# Patient Record
Sex: Male | Born: 1990 | Race: White | Hispanic: No | Marital: Married | Smoking: Current some day smoker
Health system: Southern US, Community
[De-identification: ages and names within clinical notes are randomized; demographics above are authoritative.]

## PROBLEM LIST (undated history)

## (undated) DIAGNOSIS — I10 Essential (primary) hypertension: Secondary | ICD-10-CM

## (undated) DIAGNOSIS — J189 Pneumonia, unspecified organism: Secondary | ICD-10-CM

## (undated) DIAGNOSIS — K589 Irritable bowel syndrome without diarrhea: Secondary | ICD-10-CM

## (undated) DIAGNOSIS — K219 Gastro-esophageal reflux disease without esophagitis: Secondary | ICD-10-CM

## (undated) HISTORY — PX: ORIF WRIST FRACTURE: SHX2133

---
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
## 2010-03-20 ENCOUNTER — Encounter: Payer: Self-pay | Admitting: Orthopedic Surgery

## 2010-03-20 ENCOUNTER — Emergency Department (HOSPITAL_COMMUNITY): Admission: EM | Admit: 2010-03-20 | Discharge: 2010-03-20 | Payer: Self-pay | Admitting: Emergency Medicine

## 2010-03-23 ENCOUNTER — Encounter: Payer: Self-pay | Admitting: Orthopedic Surgery

## 2010-03-26 ENCOUNTER — Ambulatory Visit: Payer: Self-pay | Admitting: Orthopedic Surgery

## 2010-03-26 ENCOUNTER — Telehealth: Payer: Self-pay | Admitting: Orthopedic Surgery

## 2010-03-26 DIAGNOSIS — S93409A Sprain of unspecified ligament of unspecified ankle, initial encounter: Secondary | ICD-10-CM | POA: Insufficient documentation

## 2010-03-27 ENCOUNTER — Telehealth: Payer: Self-pay | Admitting: Orthopedic Surgery

## 2010-03-27 ENCOUNTER — Encounter: Payer: Self-pay | Admitting: Orthopedic Surgery

## 2010-03-29 ENCOUNTER — Encounter: Payer: Self-pay | Admitting: Orthopedic Surgery

## 2010-04-02 ENCOUNTER — Telehealth: Payer: Self-pay | Admitting: Orthopedic Surgery

## 2010-04-04 ENCOUNTER — Encounter: Payer: Self-pay | Admitting: Orthopedic Surgery

## 2010-04-05 ENCOUNTER — Encounter: Payer: Self-pay | Admitting: Orthopedic Surgery

## 2010-04-09 ENCOUNTER — Ambulatory Visit: Payer: Self-pay | Admitting: Orthopedic Surgery

## 2010-04-10 ENCOUNTER — Telehealth: Payer: Self-pay | Admitting: Orthopedic Surgery

## 2010-04-12 ENCOUNTER — Encounter: Payer: Self-pay | Admitting: Orthopedic Surgery

## 2010-04-19 ENCOUNTER — Encounter: Payer: Self-pay | Admitting: Orthopedic Surgery

## 2010-04-23 ENCOUNTER — Encounter: Payer: Self-pay | Admitting: Orthopedic Surgery

## 2010-04-26 ENCOUNTER — Encounter: Payer: Self-pay | Admitting: Orthopedic Surgery

## 2010-04-30 ENCOUNTER — Encounter (INDEPENDENT_AMBULATORY_CARE_PROVIDER_SITE_OTHER): Payer: Self-pay | Admitting: *Deleted

## 2010-04-30 ENCOUNTER — Ambulatory Visit: Payer: Self-pay | Admitting: Orthopedic Surgery

## 2010-05-07 ENCOUNTER — Encounter: Payer: Self-pay | Admitting: Orthopedic Surgery

## 2010-05-10 ENCOUNTER — Encounter: Payer: Self-pay | Admitting: Orthopedic Surgery

## 2010-05-14 ENCOUNTER — Ambulatory Visit: Payer: Self-pay | Admitting: Orthopedic Surgery

## 2010-05-15 ENCOUNTER — Encounter: Payer: Self-pay | Admitting: Orthopedic Surgery

## 2010-09-06 ENCOUNTER — Ambulatory Visit (HOSPITAL_COMMUNITY): Admission: RE | Admit: 2010-09-06 | Discharge: 2010-09-06 | Payer: Self-pay | Admitting: Pulmonary Disease

## 2010-09-20 ENCOUNTER — Ambulatory Visit: Payer: Self-pay | Admitting: Gastroenterology

## 2010-09-20 DIAGNOSIS — R197 Diarrhea, unspecified: Secondary | ICD-10-CM

## 2010-09-21 ENCOUNTER — Ambulatory Visit: Payer: Self-pay | Admitting: Gastroenterology

## 2010-09-21 ENCOUNTER — Ambulatory Visit (HOSPITAL_COMMUNITY): Admission: RE | Admit: 2010-09-21 | Discharge: 2010-09-21 | Payer: Self-pay | Admitting: Gastroenterology

## 2010-09-26 ENCOUNTER — Encounter: Payer: Self-pay | Admitting: Gastroenterology

## 2010-09-27 ENCOUNTER — Telehealth (INDEPENDENT_AMBULATORY_CARE_PROVIDER_SITE_OTHER): Payer: Self-pay

## 2010-09-28 ENCOUNTER — Encounter: Payer: Self-pay | Admitting: Gastroenterology

## 2010-09-28 ENCOUNTER — Encounter (INDEPENDENT_AMBULATORY_CARE_PROVIDER_SITE_OTHER): Payer: Self-pay

## 2010-10-01 ENCOUNTER — Ambulatory Visit (HOSPITAL_COMMUNITY): Admission: RE | Admit: 2010-10-01 | Discharge: 2010-10-01 | Payer: Self-pay | Admitting: Gastroenterology

## 2010-10-01 ENCOUNTER — Telehealth (INDEPENDENT_AMBULATORY_CARE_PROVIDER_SITE_OTHER): Payer: Self-pay

## 2010-10-03 DIAGNOSIS — R109 Unspecified abdominal pain: Secondary | ICD-10-CM | POA: Insufficient documentation

## 2010-10-19 ENCOUNTER — Encounter: Payer: Self-pay | Admitting: Gastroenterology

## 2010-10-23 ENCOUNTER — Ambulatory Visit: Payer: Self-pay | Admitting: Gastroenterology

## 2010-10-23 DIAGNOSIS — L738 Other specified follicular disorders: Secondary | ICD-10-CM

## 2010-10-23 DIAGNOSIS — K589 Irritable bowel syndrome without diarrhea: Secondary | ICD-10-CM

## 2010-10-24 ENCOUNTER — Encounter (INDEPENDENT_AMBULATORY_CARE_PROVIDER_SITE_OTHER): Payer: Self-pay | Admitting: *Deleted

## 2010-10-24 ENCOUNTER — Telehealth (INDEPENDENT_AMBULATORY_CARE_PROVIDER_SITE_OTHER): Payer: Self-pay

## 2010-10-24 ENCOUNTER — Telehealth (INDEPENDENT_AMBULATORY_CARE_PROVIDER_SITE_OTHER): Payer: Self-pay | Admitting: *Deleted

## 2011-01-08 ENCOUNTER — Encounter (INDEPENDENT_AMBULATORY_CARE_PROVIDER_SITE_OTHER): Payer: Self-pay | Admitting: *Deleted

## 2011-01-20 ENCOUNTER — Encounter: Payer: Self-pay | Admitting: Gastroenterology

## 2011-01-29 NOTE — Progress Notes (Signed)
Summary: call from patient, ankel still swelling,throbbing  Phone Note Call from Patient   Caller: Patient Summary of Call: Patient called to relay that he has his [facility] [place] on Wed 4/6.  Next appt here is [facility] 04/09/10.   Asking about throbbing, which he is having, except when elevating, and still some swelling.   Home ph is [phone] Initial call taken by: Cammie Sickle,  April 02, 2010 1:36 PM  Follow-up for Phone Call        this is normal, just elevate and ice as much as possible Follow-up by: Ether Griffins,  April 02, 2010 1:44 PM  Additional Follow-up for Phone Call Additional follow up Details #1::        advised patient. Additional Follow-up by: Cammie Sickle,  April 02, 2010 2:41 PM

## 2011-01-29 NOTE — Letter (Signed)
Summary: TCS/EGD ORDER  TCS/EGD ORDER   Imported By: Ave Filter 09/20/2010 11:44:29  _____________________________________________________________________  External Attachment:    Type:   Image     Comment:   External Document

## 2011-01-29 NOTE — Miscellaneous (Signed)
Summary: PT  progress note  PT  progress note   Imported By: Jacklynn Ganong 05/11/2010 09:45:11  _____________________________________________________________________  External Attachment:    Type:   Image     Comment:   External Document

## 2011-01-29 NOTE — Progress Notes (Signed)
Summary: Office Visits 08/09/04 and 09/28/03 RTwrist  Office Visits 08/09/04 and 09/28/03 RTwrist   Imported By: Cammie Sickle 03/26/2010 12:15:09  _____________________________________________________________________  External Attachment:    Type:   Image     Comment:   External Document

## 2011-01-29 NOTE — Progress Notes (Signed)
Summary: call to W.Comp case mgr auth for PT requested  Phone Note Outgoing Call   Call placed to: Insurer Summary of Call: [employer], [place], rep and case manager, [place], Ph [phone], fax [phone].  Requested authorization for physical therapy.  (Also had left a message 03/26/10 in general voice mail at [facility])  Faxed office notes and PT order 03/27/10.  Advised patient of status. Initial call taken by: Cammie Sickle,  March 27, 2010 10:50 AM  Follow-up for Phone Call        Rec'd call back from Workers Comp Carrier. Spoke with Jacki Cones.  She has verbally approved PT for [facility] in [place].  Faxing written approval.  She has contacted patient.  I verified with patient. Follow-up by: Cammie Sickle,  March 29, 2010 4:42 PM

## 2011-01-29 NOTE — Assessment & Plan Note (Signed)
Summary: ABD PAIN, DIARRHEA   Visit Type:  Initial Consult Referring Provider:  Ouida Sills Primary Care Provider:  Ouida Sills, M.D.  Chief Complaint:  abd pain/diarrhea.  History of Present Illness: Bms: 3-4 times/day. No nocturnal stools. No blood but has been green. Severe cramps: since AUG. Always had problems in school and looses bowel using Imodium as needed. Hyoscyamine helps to slow it down. Stool studies neg. No fever or chills, rash on lower legs, sores in his mouth, or joints, or melena. heat bum, pain in lower back when stomach hurts. Can't tell any foods make it worse. Avoiding dairy and fatty foods: helped with pain but not the diarrhea. No travel, Abx, and has city water. Chronically used Pepto Bismol. Pt does not like his job.  Current Medications (verified): 1)  Levsin 0.125 Mg Tabs (Hyoscyamine Sulfate) .... Three Times A Day  Allergies (verified): No Known Drug Allergies  Past History:  Past Medical History: Diarrhea  Family History: No FH of Colon, uterine, Ovarian, or Breast Cancer  Family History of Diabetes Family History Coronary Heart Disease male < 75 Hx, family, chronic respiratory condition Hx, family, kidney disease NEC  Mom has IBS-d and achalasia.  Cousin: colonic inertia, s/p subtotal colectomy.  Social History: Patient is single. Was working full time. life guard/sport coverage no smoking no alcohol some caffeine use Very physical and demanding job: distribution, Designer, jewellery ** out of work for 3 weeks**  Review of Systems       SEP 2011: 259 LBS  HB 13.6 PLT 300 ESR 2 CR 0.96 AST 21 ALT 29 ALB 4.3 TSH 2.664 TBILI 0.4     UGI/SBFT: NL TRANSIT, NO LESIONS  Vital Signs:  Patient profile:   20 year old male Height:      71 inches Weight:      253 pounds BMI:     35.41 Temp:     97.9 degrees F oral Pulse rate:   60 / minute BP sitting:   104 / 62  (left arm) Cuff size:   regular  Vitals Entered By: Cloria Spring LPN (September 20, 2010  11:03 AM)  Physical Exam  General:  Well developed, well nourished, no acute distress. Head:  Normocephalic and atraumatic. Eyes:  PERRL, no icterus. Mouth:  No deformity or lesions, dentition normal. Neck:  Supple; no masses. Lungs:  Clear throughout to auscultation. Heart:  Regular rate and rhythm; no murmurs. Abdomen:  Soft, nontender and nondistended. Normal bowel sounds. Extremities:  No edema or deformities noted. Neurologic:  Alert and  oriented x4;  grossly normal neurologically. 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 Differential diagnosis includes low likelihood of GIARDIASIS, CELIAC SPRUE, lactose intolerance, IBD, colorectal cancer, microscopic colitis, or eosinophilic gastroenetritis. TCS/EGD 9/23 BIOPSY COLON AND DUODENUM. Obtain stool study results from Dr. Ouida Sills.   CC: PCP  Appended Document: ABD PAIN, DIARRHEA pt has fu OV in one month scheduled  Appended Document: Orders Update    Clinical Lists Changes  Problems: Added new problem of ABDOMINAL PAIN (ICD-789.00) Orders: Added new Service order of Consultation Level IV [phone]) - Signed

## 2011-01-29 NOTE — Miscellaneous (Signed)
Summary: Orders Update  Clinical Lists Changes  Orders: Added new Test order of T-Stool Giardia / Crypto- EIA (87328) - Signed 

## 2011-01-29 NOTE — Progress Notes (Signed)
Summary: pt's mom expresses concern  Phone Note Call from Patient   Caller: Mom Summary of Call: Pt's mom came by to pick up the work note. She asked me to ask Dr.Fields if she thinks that his problems are coming from stress from his job. She was very tearful and said pt is afraid that he will lose his job. I told her that  I didn't know anything and i couldn't say if i did. She said she has been with him to some appts and he still lives at home and she was hoping Dr. Darrick Penna could give her some insight in to this. She is aware Dr. Darrick Penna not here,  and that she will not get a reply before tomorrow. Initial call taken by: Cloria Spring LPN,  October 24, 2010 4:36 PM     Appended Document: pt's mom expresses concern Please call his mother. I would be more than happy to speak with her. However, the pt needs to call me and give me permission to speak with her.   Appended Document: pt's mom expresses concern pts father aware

## 2011-01-29 NOTE — Miscellaneous (Signed)
Summary: PT Order + W.Comp approval billing   PT Order + W.Comp approval billing   Imported By: Cammie Sickle 04/21/2010 11:52:14  _____________________________________________________________________  External Attachment:    Type:   Image     Comment:   External Document

## 2011-01-29 NOTE — Miscellaneous (Signed)
Summary: PT Progress note  PT Progress note   Imported By: Jacklynn Ganong 05/09/2010 07:49:09  _____________________________________________________________________  External Attachment:    Type:   Image     Comment:   External Document

## 2011-01-29 NOTE — Miscellaneous (Signed)
Summary: PT progress note  PT progress note   Imported By: Jacklynn Ganong 06/18/2010 13:04:49  _____________________________________________________________________  External Attachment:    Type:   Image     Comment:   External Document

## 2011-01-29 NOTE — Op Note (Signed)
Summary: Op Report RT Wrist Closed reduction and percutaneous pinning  Op Report RT Wrist Closed reduction and percutaneous pinning   Imported By: Cammie Sickle 03/26/2010 10:50:27  _____________________________________________________________________  External Attachment:    Type:   Image     Comment:   External Document

## 2011-01-29 NOTE — Miscellaneous (Signed)
Summary: PT progress note  PT progress note   Imported By: Jacklynn Ganong 05/23/2010 16:41:06  _____________________________________________________________________  External Attachment:    Type:   Image     Comment:   External Document

## 2011-01-29 NOTE — Miscellaneous (Signed)
Summary: PT Progress notes  PT Progress notes   Imported By: Jacklynn Ganong 05/01/2010 12:20:57  _____________________________________________________________________  External Attachment:    Type:   Image     Comment:   External Document

## 2011-01-29 NOTE — Progress Notes (Signed)
Summary: abd pain and work note  Phone Note Call from Patient Call back at [employer] [phone]   Caller: Patient Summary of Call: pt called and stated he spoke with SLF yesterday. He said that she said she would give him something else if he continued to have abd pain. He is still taking the Levsin but is requesting something else called in for abd pain and diarrhea. He also said he didnt go to work today and is requesting a work note. I asked pt if he went for SBFT this am and he stated he didnt know anything about it, informed pt that we would need to reschedule. I also reminded him that his stool study container was at the front desk and he would need someone to pick it up and return it to the lab. please advise if pt can have work note and Rx. Initial call taken by: Hendricks Limes LPN,  September 27, 2010 10:01 AM     Appended Document: abd pain and work note Pt rescheduled to 10/01/10@8 :00am  Pt aware of appt.  Appended Document: abd pain and work note may have excuse for today; hold levsin; can try librax one cap ac and at bedtime; disp #40 ; no refills  Appended Document: abd pain and work note pt aware, work note at Psychologist, sport and exercise, rx called to [facility]

## 2011-01-29 NOTE — Letter (Signed)
Summary: Out of Work Note  [facility]  [address]   [place], [place] [address]   Phone: [phone]  Fax: [phone]    10/24/2010  TO: WHOM IT MAY CONCERN  RE: Mitchell Quinn [address] [id number] 06/10/1991       The above named individual is currently under my care and will be out of work    FROM: 10/24/2010   THROUGH: 10/25/2010     MAY RETURN ON: 10/26/2010     If you have any further questions or need additional information, please call.     Sincerely,     [facility] R. Roetta Sessions, M.D.    Jonette Eva, M.D. Lorenza Burton, FNP-BC    Tana Coast, PA-C Phone: [phone]    Fax: [phone]

## 2011-01-29 NOTE — Progress Notes (Signed)
Summary: patient question about swimming  called patient and advised.  ---- Converted from flag ---- ---- 03/26/2010 3:31 PM, Fuller Canada MD wrote: no swimming for 2 weeks  ---- 03/26/2010 3:25 PM, Cammie Sickle wrote: Tarri Abernethy, dob 1991/07/25, asking if swimming allowed for part of therapy? ------------------------------

## 2011-01-29 NOTE — Letter (Signed)
Summary: Out of Work*See corrected Work Excuse  [facility]  [address]  [place], [place] [address]   Phone: [phone]  Fax: [phone]    March 26, 2010   Employee:  Mitchell Quinn    To Whom It May Concern:   For Medical reasons, please excuse the above named employee from work for the following dates:  Start:   March 26, 2010  End:   April 09, 2010              Next appt scheduled April 09, 2010 at 2:45pm   If you need additional information, please feel free to contact our office.         Sincerely,    Terrance Mass, MD

## 2011-01-29 NOTE — Progress Notes (Signed)
Summary: Office notes faxed to Work Comp  Phone Note Other Incoming   Caller: Work Chief Technology Officer of Call: Jacki Cones from [employer], [employer], ph [phone] called to request office notes. Faxed to [phone].  Auth on file. Initial call taken by: Cammie Sickle,  April 10, 2010 2:57 PM

## 2011-01-29 NOTE — Progress Notes (Signed)
Summary: Office Visits 03/17/06,03/03/06,02/26/06 RTshoulder  Office Visits 03/17/06,03/03/06,02/26/06 RTshoulder   Imported By: Cammie Sickle 03/26/2010 12:17:04  _____________________________________________________________________  External Attachment:    Type:   Image     Comment:   External Document

## 2011-01-29 NOTE — Letter (Signed)
Summary: Employers Report of Injury W.Comp Form 19  Employers Report of Injury W.Comp Form 19   Imported By: Cammie Sickle 03/26/2010 15:36:59  _____________________________________________________________________  External Attachment:    Type:   Image     Comment:   External Document

## 2011-01-29 NOTE — Progress Notes (Signed)
Summary: another work note  Phone Note Call from Patient Call back at [employer] [phone]   Caller: Patient Summary of Call: pt came to office- he didnt go work on Sat. or Wynelle Link because he had abd pain and nausea. pt stated he had sbft today. He also stated that the librax seemed to help a little more that the levsin. pt wants to know if he can have a work note for Sat. and Sun. please advise Initial call taken by: Hendricks Limes LPN,  October 01, 2010 12:39 PM     Appended Document: another work note Please call pt. I will give him a note for SAT and SUN but I will not continue to give him work excuses. He has IBS and he needs to start a Imipramine qhs for pain and bowel control and see a mental health specialist to deal with stress related work issues. He needs to take Align daily. Med side effects include drowsiness, dry mouth/eyes, and constipation.  Call in Imipramine 10 mg 1 by mouth at bedtime for 3 days, 2 by mouth at bedtime for 3 days, and 3 by mouth at bedtime, #90, rfx5.   Appended Document: another work note Please let him know his SBFT was normal.  Appended Document: another work note Informed pt of the above. Rx called to [place] [facility]. Work note at front for pick-up.

## 2011-01-29 NOTE — Letter (Signed)
Summary: Out of Work  [facility]  [address]  [place], [place] [address]   Phone: [phone]  Fax: [phone]    April 09, 2010   Employee:  PAU BANH    To Whom It May Concern:   For Medical reasons, please excuse the above named employee from work for the following dates:  (Patient/Employee seen in our office for appointment today)  Start:   April 09, 2010 * Patient/Employee is to remain out of work, no light duty, no work  End:   Apr 30, 2010   Next apppointment:   Apr 30, 2010  Estimated return to work date:   May 01, 2010   If you need additional information, please feel free to contact our office.         Sincerely,   Terrance Mass, MD

## 2011-01-29 NOTE — Letter (Signed)
Summary: Out of Work Note  [facility]  [address]   [place], [place] [address]   Phone: [phone]  Fax: [phone]    09/28/2010  TO: WHOM IT MAY CONCERN  RE: Mitchell Quinn [address] [id number] 22-Feb-1991       The above named individual is currently under my care and will be out of work    09/27/2010     If you have any further questions or need additional information, please call.     Sincerely,     [facility] R. Roetta Sessions, M.D.    Jonette Eva, M.D. Lorenza Burton, FNP-BC    Tana Coast, PA-C Phone: [phone]    Fax: [phone]

## 2011-01-29 NOTE — Miscellaneous (Signed)
Summary: [facility]  [facility]   Imported By: Cammie Sickle 04/16/2010 07:06:40  _____________________________________________________________________  External Attachment:    Type:   Image     Comment:   External Document

## 2011-01-29 NOTE — Progress Notes (Signed)
Summary: Office Visit 10/03/04 back strain  Office Visit 10/03/04 back strain   Imported By: Cammie Sickle 03/26/2010 12:18:54  _____________________________________________________________________  External Attachment:    Type:   Image     Comment:   External Document

## 2011-01-29 NOTE — Miscellaneous (Signed)
Summary: PT Initial note  PT Initial note   Imported By: Jacklynn Ganong 04/18/2010 16:34:10  _____________________________________________________________________  External Attachment:    Type:   Image     Comment:   External Document

## 2011-01-29 NOTE — Letter (Signed)
Summary: Work Excuse update  [facility]  [address]  [place], [place] [address]   Phone: [phone]  Fax: [phone]     Today's Date: March 26, 2010  Name of Patient: Mitchell Quinn  The above named patient had a medical visit today at:   2:30 pm.  Please take this into consideration when reviewing the time away from work/school.    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   ________________________________________________________________________   Sincerely yours,   Terrance Mass, MD

## 2011-01-29 NOTE — Assessment & Plan Note (Signed)
Summary: 2 WK RE-CK RT ANKLE/W.COMP/DOI 03/20/10/CAF   Visit Type:  Follow-up Referring Provider:  self Primary Provider:  Dr. Ouida Sills  CC:  recheck ankle.  History of Present Illness: I saw Bohden Urias in the office today for a followup evaluation right ankle sprain, Worker Comp.  date of injury March 22  treated with ASO brace and Ibuprofen and PT   medication ibuprofen intermittently  physical therapy  at [facility]  Today is 2 week recheck after returning to work light duty, using Ibuprofen and bracing.  he's doing well and is improved.  He is anxious to return to normal duty  Ankle is stable range of motion is essentially normal.  Recommend continue his progression toward return to full duty with 8 8 hour days this week and then weightbearing in his ASO brace next week.  Allergies: No Known Drug Allergies   Other Orders: Est. Patient Level II ([address])  Patient Instructions: 1)  5/16 - 21[ 8 hrs ] 2)  5/22 on start ambulation at work  3)  brace,  continue thru 5 / 31  4)  f/u as needed  5)  ok to swim

## 2011-01-29 NOTE — Letter (Signed)
Summary: Out of Work  [facility]  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         Sincerely,    Fuller Canada MD

## 2011-01-29 NOTE — Progress Notes (Signed)
Summary: Pt has N/V  Phone Note Call from Patient Call back at [phone]   Caller: Patient Call For: Dr notified Reason for Call: Talk to Doctor Complaint: Nausea/Vomiting/Diarrhea, Abdominal Pain Action Taken: Ogden Handlin Notified Summary of Call: Patient called and stated he was N/V all night. He has not started the medication you prescribed for him on yesterday.Marland KitchenMarland KitchenMarland KitchenPlease advise?  Initial call taken by: Ave Filter,  October 24, 2010 10:01 AM     Appended Document: Pt has N/V Please call pt. He probably has a viral illness. Follow a clear liquid diet for 24 hours then advance to Full liquid diet. Call in Phenergan 25 mg 1/2-1 tab by mouth q4-6h as needed nausea or vomiting, #25 rfx0. Work excuse for 10/26-27. RTW 10/28.  Appended Document: Pt has N/V Pt aware..Rx needs to be called to [facility] in [place]  Appended Document: Pt has N/V Rx called to [place] [facility].

## 2011-01-29 NOTE — Miscellaneous (Signed)
Summary: PT initial note  PT initial note   Imported By: Jacklynn Ganong 04/19/2010 10:13:49  _____________________________________________________________________  External Attachment:    Type:   Image     Comment:   External Document

## 2011-01-29 NOTE — Miscellaneous (Signed)
Summary: Rehab Report  Rehab Report   Imported By: Cammie Sickle 04/21/2010 11:50:52  _____________________________________________________________________  External Attachment:    Type:   Image     Comment:   External Document

## 2011-01-29 NOTE — Letter (Signed)
Summary: Historic Patient File  Historic Patient File   Imported By: Mitchell Quinn 03/30/2010 09:48:44  _____________________________________________________________________  External Attachment:    Type:   Image     Comment:   history form

## 2011-01-29 NOTE — Letter (Signed)
Summary: SBFT ORDER  SBFT ORDER   Imported By: Ave Filter 09/26/2010 12:28:24  _____________________________________________________________________  External Attachment:    Type:   Image     Comment:   External Document

## 2011-01-29 NOTE — Letter (Signed)
Summary: WORK NOTE  WORK NOTE   Imported By: Rexene Alberts 10/19/2010 09:04:34  _____________________________________________________________________  External Attachment:    Type:   Image     Comment:   External Document

## 2011-01-29 NOTE — Assessment & Plan Note (Signed)
Summary: 2 WK RE-CK RT ANKLE/W.COMP,DOI 03/20/10/CAF   Visit Type:  Follow-up Referring Provider:  self Primary Provider:  Dr. Ouida Sills  CC:  right ankle pain.  History of Present Illness: visit for Primo Innis status post grade 2 ankle sprain, he says the therapist to take the Cam Walker off which is not what I ordered  He is now in an ASO brace he is ambulating with crutches  His swelling has decreased significantly he still has a lot of stiffness in the ankle as a tenderness along the tib-fib area and along the ATFL  Recommend continued physical therapy weight-bear as tolerated convert ASO brace now.  out of work for 3 more weeks come back in 3 weeks  Allergies: No Known Drug Allergies   Impression & Recommendations:  Problem # 1:  UNSPECIFIED SITE OF ANKLE SPRAIN AND STRAIN (ICD-845.00) Assessment Improved  Orders: Est. Patient Level II ([address])  Patient Instructions: 1)  Please schedule a follow-up appointment in 3 weeks. 2)  ASO brace WBAT

## 2011-01-29 NOTE — Letter (Signed)
Summary: Out of Work  [facility]  [address]  [place], [place] [address]   Phone: [phone]  Fax: [phone]    Apr 30, 2010 - appointment in our office today 3:15PM   Employee:  Mitchell Quinn    To Whom It May Concern:   For Medical reasons, please note that the above named employee may resume work as follows:  05/01/10 for Light Duty  - sitting, elevating as needed, working 4 hours a day for 1 week  - continue physical therapy as scheduled  - for week 2, increase to 6 hours a day   Follow up appointment scheduled in 2 weeks - appointment 05/14/10, 11:45am         If you need additional information, please feel free to contact our office.         Sincerely,    Terrance Mass, MD

## 2011-01-29 NOTE — Miscellaneous (Signed)
Summary: Rehab Report 5/9 - 05/10/10  Rehab Report 5/9 - 05/10/10   Imported By: Cammie Sickle 06/06/2010 17:58:42  _____________________________________________________________________  External Attachment:    Type:   Image     Comment:   External Document

## 2011-01-29 NOTE — Letter (Signed)
Summary: [employer] ins request  [employer] ins request   Imported By: Cammie Sickle 04/23/2010 17:16:06  _____________________________________________________________________  External Attachment:    Type:   Image     Comment:   External Document

## 2011-01-29 NOTE — Assessment & Plan Note (Signed)
Summary: IBS-d, FOLIICULITIS   Visit Type:  Follow-up Visit Primary Care Mitchell Quinn:  Mitchell Quinn, M.D.  Chief Complaint:  f/u diarrhea/abd pain.  History of Present Illness: Had a red rash since on arms, legs, sides. May drain or have little whiteheads. Sx for one week. Doesn't itch. Taking ALign daily. Food doesn't afect BMs. Spicy foods makes stomach hurt worse. Bms: mostly 1 in AM, about normal. Stomach hurts only once a week. Appetite: good. Works 5 days a week with random days off. No new soaps, detergents, cologne, or clothes. Sweats alot at work and then drives home for 30 mins/. Shifts  ~10-12 hours.   Current Medications (verified): 1)  Levsin 0.125 Mg Tabs (Hyoscyamine Sulfate) .... Three Times A Day 2)  Librax 2.5-5 Mg Caps (Clidinium-Chlordiazepoxide) .... As Directed  Allergies (verified): No Known Drug Allergies  Past History:  Past Medical History: Diarrhea-IBS  Social History: Reviewed history from 09/20/2010 and no changes required. Patient is single. 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 Head:  Normocephalic and atraumatic. Lungs:  Clear throughout to auscultation. Heart:  Regular rate and rhythm; no murmurs Abdomen:  Soft, nontender and nondistended. Normal bowel sounds. Skin:  maculopapular rash: ON LEGS, ARMA, AND TORSO. No pustules.  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 Continue ALign daily and  Imipraimine at bedtome. Use Levsin OR Librax as needed if Sx flare. Follow up in 3 mos.  Problem # 2:  FOLLICULITIS (ICD-704.8) Take Augmentin 500 mg two times a day for 5 days. It may cause red rash, loose stools, or abd pain. See Dr. Ouida Quinn if Sx not resolved in 7 days.  CC: PCP  Patient Instructions: 1)  Take Augmentin 500 mg two times a day for 5 days. 2)  It may cause red rash, loose stools, or abd pain. 3)  Continue ALign daily and Imipraimine at bedtome. 4)  Use Levsin OR Librax as needed if Sx flare. 5)  Follow up in 3 mos. 6)  The medication list was reviewed and reconciled.  All changed / newly prescribed medications were explained.  A complete medication list was provided to the patient / caregiver.  Prescriptions: AUGMENTIN 500-125 MG TABS (AMOXICILLIN-POT CLAVULANATE) 1 by mouth q12h for 5 days  #10 x 0   Entered and Authorized by:   West Bali MD   Signed by:   West Bali MD on 10/23/2010   Method used:   Electronically to        [facility] (retail)       [address]       [place], [place]  [address]       Ph: [phone]       Fax: [phone]   RxID:   [id number]   Appended Document: Orders Update  Clinical Lists Changes  Orders: Added new Service order of Est. Patient Level IV ([address]) - Signed      Appended Document: IBS-d, FOLIICULITIS 3 MONTH F/U REMINDER IS IN THE COMPUTER  Appended Document: IBS-d, FOLIICULITIS SEP 2011 NEG STOOL CX

## 2011-01-29 NOTE — Assessment & Plan Note (Signed)
Summary: NEW PROB/ANKLE INJURY/W.COMP/HAD XRAY 03/22/10/CAF   Vital Signs:  Patient profile:   20 year old male Height:      71 inches Weight:      247 pounds Pulse rate:   76 / minute Resp:     16 per minute  Vitals Entered By: Fuller Canada MD (March 26, 2010 2:55 PM)  Visit Type:  new problem Referring Provider:  self Primary Provider:  Dr. Ouida Sills  CC:  right ankle pain.  History of Present Illness:   I saw Mitchell Quinn in the office today for an initial evaluation.  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  He basically has moderate RIGHT ankle pain associated with throbbing and burning.  There is some bruising numbness and tingling in the foot he has some calf pain a lot of swelling.  Pain described as burning and throbbing   Allergies (verified): No Known Drug Allergies  Past History:  Family History: Last updated: 03/26/2010 FH of Cancer:  Family History of Diabetes Family History Coronary Heart Disease male < 69 Hx, family, chronic respiratory condition Hx, family, kidney disease NEC  Social History: Last updated: 03/26/2010 Patient is single.  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 Neurologic:  Complains of numbness and tingling; denies unsteady gait, dizziness, tremors, and seizure. Musculoskeletal:  Complains of swelling, stiffness, and muscle pain; denies joint pain, instability, redness, and heat.  The review of systems is negative for Constitutional, Cardiovascular, Respiratory, Genitourinary, Endocrine, Psychiatric, Skin, HEENT, Immunology, and Hemoatologic.  Physical Exam  Additional Exam:  vitals are normal  He is a large male normal development grooming and hygiene  Normal pulse temperature in his RIGHT foot with no varicose veins  Gait and station he is walking with nonweightbearing status with crutches  He has a large amount of swelling in his foot and up in his leg as well.  His tracks around the posterior part of the foot and shows a lot of ecchymosis.  He has tenderness over the anterior talofibular ligament and the calcaneofibular ligament there is some tenderness in the syndesmosis region.  He has passive range of motion to neutral position and dorsiflexion and plantar flexion 20.  There is a positive drawer test.  Muscle tone is normal muscle strength is poor with eversion.  Skin is ecchymotic but intact  He's awake alert and oriented x3 he's a normal mood and sensation remains intact.   Impression & Recommendations:  Problem # 1:  UNSPECIFIED SITE OF ANKLE SPRAIN AND STRAIN (ICD-845.00)  Assessment New he did have x-rays and they are -3 views RIGHT ankle reviewed with report, agree no fracture  Assessment: type II possibly 3 ankle sprain  Recommend physical therapy as soon as possible  Where Cam Walker weight-bear as tolerated with crutches  Continue with a contrast bath 3 times a day  Follow up 2  weeks  Should not work.  Orders: Est. Patient Level IV ([address])  Other Orders: Physical Therapy Referral (PT)  Patient Instructions: 1)  Physical Therapy  2)  Wear Cam Walker  3)  Return in 2 weeks

## 2011-01-29 NOTE — Assessment & Plan Note (Signed)
Summary: 3 wk RE-CK RT ANKLE/W.COMP/DOI 03/20/10/CAF   Visit Type:  Follow-up Referring Provider:  self Primary Provider:  Dr. Ouida Sills  CC:  3 week recheck right  ankle.Marland Kitchen  History of Present Illness: I saw Mitchell Quinn in the office today for a followup evaluation right ankle sprain, Worker Comp.  date of injury March 22 treated with ASO brace and Ibuprofen.  medication ibuprofen intermittently  physical therapy is done at hand and rehabilitation  Complains of popping, swelling and tenderness and stiffness when walking  ankle drawer test is normal most of the swelling has gone down in the foot he still has some lateral ankle swelling near the fibula.  Anterolateral gutter still tender.  His range of motion has returned to near normal.  He does have pain with anterior drawer test and pain with inversion.  He is now 41 days or 6 weeks since his grade 2/3 ankle sprain and he is starting to turn the corner.  Recommend resume work on a graduated schedule.  Continue physical therapy.  Continue ASO brace.    Allergies: No Known Drug Allergies  Review of Systems Neurologic:  Denies numbness and tingling. Musculoskeletal:  See HPI.   Impression & Recommendations:  Problem # 1:  UNSPECIFIED SITE OF ANKLE SPRAIN AND STRAIN (ICD-845.00) Assessment Improved  Orders: Est. Patient Level III ([address])  Patient Instructions: 1)  Stay in ASO except for bathing and sleeping. 2)  Continue therapy 3)  You can return to work light duty, sitting elevating as needed, 4 hrs a day for a week, week 2 increase time of work to 6 hrs 4)  Come back in 2 weeks

## 2011-01-29 NOTE — Miscellaneous (Signed)
Summary: PT Progress note  PT Progress note   Imported By: Jacklynn Ganong 05/11/2010 09:44:09  _____________________________________________________________________  External Attachment:    Type:   Image     Comment:   External Document

## 2011-01-29 NOTE — Miscellaneous (Signed)
  Clinical Lists Changes  Medications: Added new medication of NORCO 5-325 MG TABS (HYDROCODONE-ACETAMINOPHEN) 1 ev 4 hrs as needed pain - Signed Rx of NORCO 5-325 MG TABS (HYDROCODONE-ACETAMINOPHEN) 1 ev 4 hrs as needed pain;  #30 x 0;  Signed;  Entered by: Fuller Canada MD;  Authorized by: Fuller Canada MD;  Method used: Printed then faxed to [facility] [address], [place], [place], [place]  [address], Ph: [phone], Fax: [phone] Observations: Added new observation of NKA: T (03/23/2010 12:47)    Prescriptions: NORCO 5-325 MG TABS (HYDROCODONE-ACETAMINOPHEN) 1 ev 4 hrs as needed pain  #30 x 0   Entered and Authorized by:   Fuller Canada MD   Signed by:   Fuller Canada MD on 03/23/2010   Method used:   Printed then faxed to ...       [facility] (retail)       [address]       [place], [place]  [address]       Ph: [phone]       Fax: [phone]   RxID:   [id number]

## 2011-01-29 NOTE — Letter (Signed)
Summary: [facility] [employer] fax  [employer] fax   Imported By: Cammie Sickle 04/23/2010 17:00:59  _____________________________________________________________________  External Attachment:    Type:   Image     Comment:   External Document

## 2011-01-31 NOTE — Letter (Signed)
Summary: Recall Office Visit  [facility]  [address]   [place], [place] [address]   Phone: [phone]  Fax: [phone]      January 08, 2011   HYLAND MOLLENKOPF [address] [place], [place]  [address] 1991-06-08   Dear Mr. SCHUPP,   According to our records, it is time for you to schedule a follow-up office visit with [place].   At your convenience, please call [phone] to schedule an office visit. If you have any questions, concerns, or feel that this letter is in error, we would appreciate your call.   Sincerely,    Diana Eves  [facility] Ph: [phone]   Fax: [phone]

## 2011-02-16 ENCOUNTER — Emergency Department (HOSPITAL_BASED_OUTPATIENT_CLINIC_OR_DEPARTMENT_OTHER)
Admission: EM | Admit: 2011-02-16 | Discharge: 2011-02-16 | Disposition: A | Discharge status: Home / Self Care | Payer: Self-pay | Attending: Emergency Medicine | Admitting: Emergency Medicine

## 2011-02-16 ENCOUNTER — Emergency Department (INDEPENDENT_AMBULATORY_CARE_PROVIDER_SITE_OTHER): Payer: Self-pay

## 2011-02-16 DIAGNOSIS — W19XXXA Unspecified fall, initial encounter: Secondary | ICD-10-CM | POA: Insufficient documentation

## 2011-02-16 DIAGNOSIS — M25569 Pain in unspecified knee: Secondary | ICD-10-CM

## 2011-02-16 DIAGNOSIS — Y9289 Other specified places as the place of occurrence of the external cause: Secondary | ICD-10-CM | POA: Insufficient documentation

## 2011-02-16 DIAGNOSIS — Y9269 Other specified industrial and construction area as the place of occurrence of the external cause: Secondary | ICD-10-CM

## 2011-02-16 DIAGNOSIS — W230XXA Caught, crushed, jammed, or pinched between moving objects, initial encounter: Secondary | ICD-10-CM

## 2011-03-24 LAB — RAPID URINE DRUG SCREEN, HOSP PERFORMED
Cocaine: NOT DETECTED
Tetrahydrocannabinol: NOT DETECTED

## 2011-03-24 LAB — ETHANOL: Alcohol, Ethyl (B): <5 mg/dL (ref 0–10)

## 2011-05-01 IMAGING — CR DG ANKLE COMPLETE 3+V*R*
3 series · 3 of 3 positions shown · non-contrast
Comparison: None

CLINICAL DATA: Right ankle injury, pain, swelling, redness

RIGHT ANKLE - COMPLETE 3+ VIEW

[view not recorded (1 of 3)]
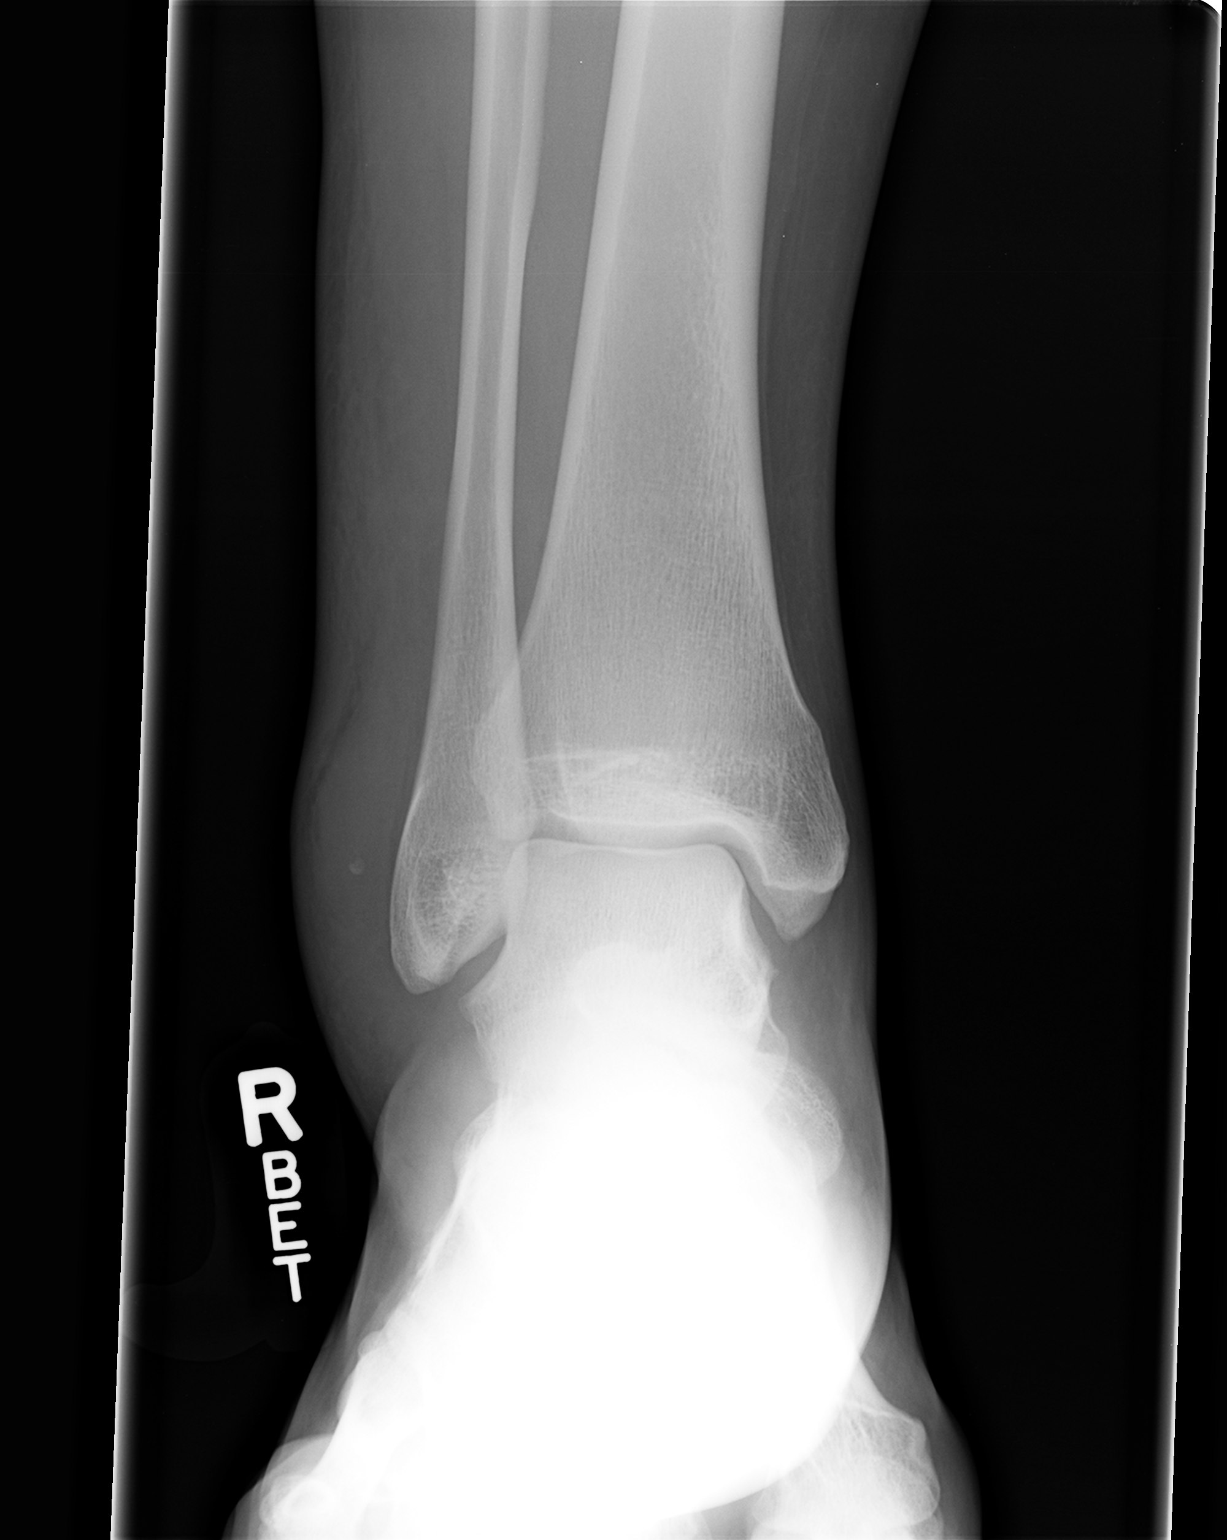

[view not recorded (2 of 3)]
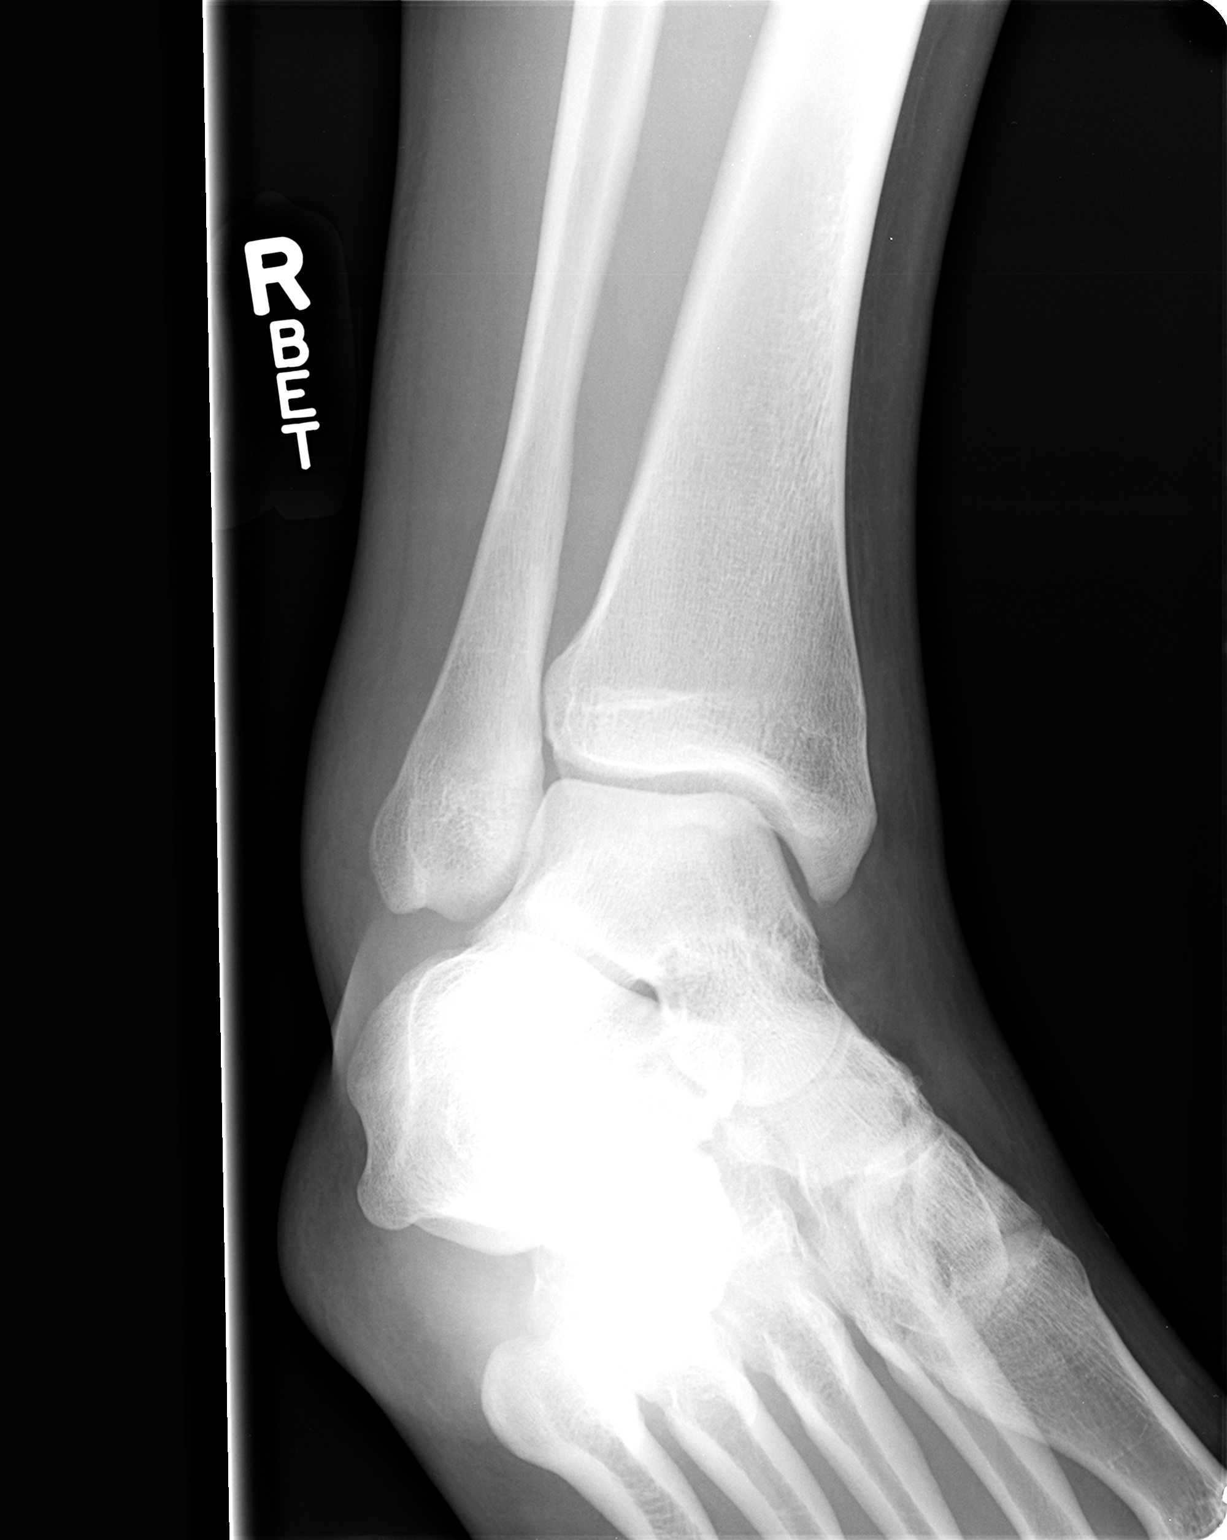

[view not recorded (3 of 3)]
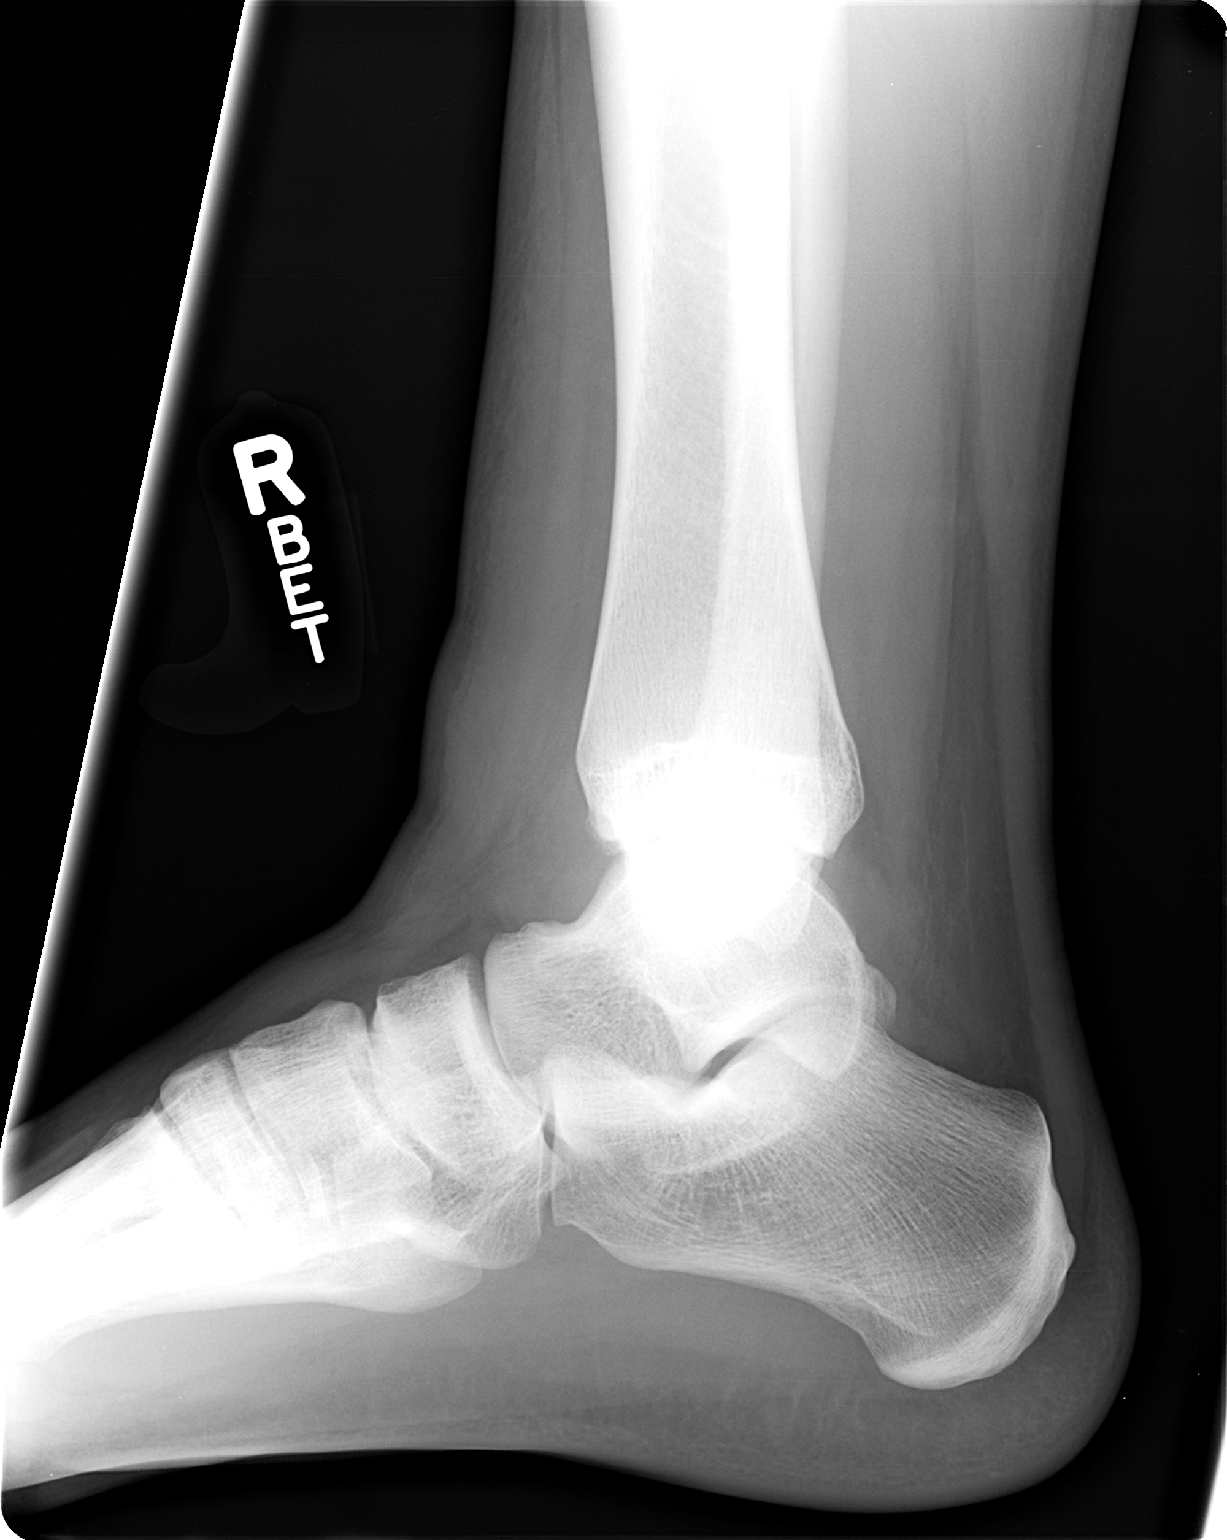

[3 of 3 positions shown; findings below may reference images not displayed]

FINDINGS: Bone mineralization normal.
Ankle mortise intact.
Significant soft tissue swelling anteriorly and laterally.
No acute fracture, dislocation or bone destruction.
Probable ankle joint effusion.
IMPRESSION: No acute bony abnormalities.

## 2011-05-17 NOTE — Op Note (Signed)
   [name], [name]                       ACCOUNT NO.:  [id number]   MEDICAL RECORD NO.:  [id number]                   PATIENT TYPE:  AMB   LOCATION:  DAY                                  FACILITY:  [facility]   PHYSICIAN:  Vickki Hearing, M.D.           DATE OF BIRTH:  02/23/1991   DATE OF PROCEDURE:  DATE OF DISCHARGE:                                 OPERATIVE REPORT   PREOPERATIVE DIAGNOSIS:  Closed distal radius fracture, right wrist.   POSTOPERATIVE DIAGNOSIS:  Closed distal radius fracture, right wrist.   PROCEDURE:  Closed reduction and percutaneous pinning.   INDICATIONS FOR PROCEDURE:  This 20 year old male fell at [facility] and  injured the right distal radius and sustained an angulated displaced closed  fracture.  After presentation to the ER and evaluation, his parents gave  informed consent for closed reduction and percutaneous pinning.   SURGEON:  Vickki Hearing, M.D.   ANESTHESIA:  General.   FINDINGS:  Angulated displaced closed distal radius fracture.   DESCRIPTION OF PROCEDURE:  The patient was identified as Mitchell Quinn.  The right wrist was marked for surgery with the surgeon's initials.  He was  given Ancef and taken to the operating room where he had general anesthesia.  At that time, a time out was taken to confirm the operative site and the  correct patient.  This was confirmed, and we proceeded with prep and drape  of the right wrist while in traction.  After 10 minutes of traction with 10  pounds, radiographs confirmed that the fracture was in good position.  Finger manipulation over the distal fragment pushed it into appropriate  position, and the 4-5 K-wire was passed across the fracture site, bent, cut,  and capped.  A sugartong splint was applied.   The patient was extubated and taken to the recovery room in stable  condition.   POSTOPERATIVE INSTRUCTIONS:  Scheduled follow up is for Friday.  Ice and  elevation.  Keep  dressing dry.  May return to school when feeling well.                                               Vickki Hearing, M.D.    SEH/MEDQ  D:  08/09/2003  T:  08/09/2003  Job:  [id number]

## 2011-05-17 NOTE — H&P (Signed)
   NAMEKAWAN, VALLADOLID NO.:  [id number]   MEDICAL RECORD NO.:  [id number]                  PATIENT TYPE:   LOCATION:                                       FACILITY:   PHYSICIAN:  Vickki Hearing, M.D.           DATE OF BIRTH:   DATE OF ADMISSION:  DATE OF DISCHARGE:                                HISTORY & PHYSICAL   CHIEF COMPLAINT:  Pain, right wrist.   HISTORY:  This is a 20 year old male who slipped Friday on grass at  [facility] and injured his right wrist.  X-rays show an angulated,  displaced right distal radius fracture with no comminution.  It appears to  involve the growth plate.  He complains of pain, swelling, decreased range  of motion.  He is in a splint.  He had been to the emergency room for  evaluation, where the splint was placed after the fracture was noted.   REVIEW OF SYSTEMS:  Negative.   ALLERGIES:  None.   MEDICAL PROBLEMS:  None.   SURGERY:  None.   MEDICATIONS:  Strattera, just started today.   FAMILY HISTORY:  Heart disease, grandparents.   SOCIAL HISTORY:  Negative.  Current grade 8.   PHYSICAL EXAMINATION:  VITAL SIGNS:  Weight 214, respiratory rate of 20,  pulse low 70s.  GENERAL:  A healthy, well-developed, well-nourished male, hygiene normal, no  deformity.  CARDIAC:  No swelling or varicosities.  Pulses intact.  No temperature  noted, no edema, no tenderness.  LYMPHATIC:  Negative.  MUSCULOSKELETAL:  Gait and station normal.  Wrist is in a splint.  Range of  motion, stability, and strength examination of the right upper extremity  deferred.  All other extremities normal.  SKIN:  Normal.  NEUROLOGIC:  Normal.  PSYCHIATRIC:  Normal.   MEDICAL DECISION MAKING:  Data reviewed.  X-rays from the hospital show an  angulated distal radius fracture with dorsal angulation, dorsal tilt of the  joint surface.  It is near and around the growth plate.  I cannot tell if it  involves it or not.   DIAGNOSIS:  Distal radius fracture.   PLAN:  Closed reduction, percutaneous pinning.  CPT code is [phone], ICD-9  code is 813.41.  Planned procedure code 11914.                                               Vickki Hearing, M.D.    SEH/MEDQ  D:  08/08/2003  T:  08/08/2003  Job:  [id number]

## 2013-06-24 ENCOUNTER — Emergency Department (HOSPITAL_COMMUNITY)
Admission: EM | Admit: 2013-06-24 | Discharge: 2013-06-24 | Disposition: A | Discharge status: Home / Self Care | Payer: Self-pay | Source: Home / Self Care | Attending: Emergency Medicine | Admitting: Emergency Medicine

## 2013-06-24 ENCOUNTER — Encounter (HOSPITAL_COMMUNITY): Payer: Self-pay | Admitting: Emergency Medicine

## 2013-06-24 DIAGNOSIS — L02419 Cutaneous abscess of limb, unspecified: Secondary | ICD-10-CM

## 2013-06-24 DIAGNOSIS — L03119 Cellulitis of unspecified part of limb: Secondary | ICD-10-CM

## 2013-06-24 DIAGNOSIS — Z23 Encounter for immunization: Secondary | ICD-10-CM

## 2013-06-24 MED ORDER — CEPHALEXIN 500 MG PO CAPS: 500.0000 mg | ORAL_CAPSULE | Freq: Four times a day (QID) | ORAL | Status: DC

## 2013-06-24 MED ORDER — TETANUS-DIPHTH-ACELL PERTUSSIS 5-2.5-18.5 LF-MCG/0.5 IM SUSP: 0.5000 mL | Freq: Once | INTRAMUSCULAR | Status: DC

## 2013-06-24 MED ORDER — TETANUS-DIPHTH-ACELL PERTUSSIS 5-2.5-18.5 LF-MCG/0.5 IM SUSP
INTRAMUSCULAR | Status: AC
  Filled 2013-06-24: qty 0.5

## 2013-06-24 NOTE — ED Notes (Signed)
Pt c/o scrapes to left calf three days ago. Pt states a pot/vase fell on leg. Area is red with mild swelling and tenderness. Denies any other symptoms.  Pt is unsure of last tetanus shot.

## 2013-06-26 NOTE — ED Provider Notes (Signed)
Medical screening examination/treatment/procedure(s) were performed by non-physician practitioner and as supervising physician I was immediately available for consultation/collaboration.  Leslee Home, M.D.  Reuben Likes, MD 06/26/13 [phone]

## 2013-06-26 NOTE — ED Provider Notes (Signed)
   History    CSN: [id number] Arrival date & time 06/24/13  4098  First MD Initiated Contact with Patient 06/24/13 1930     Chief Complaint  Patient presents with  . Extremity Laceration    scrapes to left calf 3 days ago. tightness, redness and swelling    (Consider location/radiation/quality/duration/timing/severity/associated sxs/prior Treatment) HPI  22 yo wm presents today with scrapes, redness, and pain in his left calf.  States that 3 days ago he scraped his leg on a dirty flower pot outside his home.  Cannot recall when his last tetanus shot was.  Denies fever, chills.  Leg has become more sore with more redness.   History reviewed. No pertinent past medical history. History reviewed. No pertinent past surgical history. History reviewed. No pertinent family history. History  Substance Use Topics  . Smoking status: Never Smoker   . Smokeless tobacco: Not on file  . Alcohol Use: No    Review of Systems  Constitutional: Negative.   HENT: Negative.   Eyes: Negative.   Respiratory: Negative.   Cardiovascular: Negative.   Gastrointestinal: Negative.   Genitourinary: Negative.   Musculoskeletal:       Left calf soreness.   Skin:       Redness left calf  Neurological: Negative.   Psychiatric/Behavioral: Negative.     Allergies  Review of patient's allergies indicates no known allergies.  Home Medications   Current Outpatient Rx  Name  Route  Sig  Dispense  Refill  . cephALEXin (KEFLEX) 500 MG capsule   Oral   Take 1 capsule (500 mg total) by mouth every 6 (six) hours.   30 capsule   0    BP 130/99  Pulse 67  Temp(Src) 98.6 F (37 C) (Oral)  Resp 18  SpO2 100% Physical Exam  Constitutional: He is oriented to person, place, and time. He appears well-developed and well-nourished.  HENT:  Head: Normocephalic and atraumatic.  Eyes: EOM are normal. Pupils are equal, round, and reactive to light.  Neck: Normal range of motion.  Cardiovascular: Normal rate and  regular rhythm.   Pulmonary/Chest: Effort normal and breath sounds normal.  Abdominal: Soft.  Musculoskeletal: Normal range of motion.  Neurological: He is alert and oriented to person, place, and time.  Skin:  Does have a few scrapes on left calf with some redness and soreness.  Possible superficial skin infection.  No drainage.    Psychiatric: He has a normal mood and affect.    ED Course  Procedures (including critical care time) Labs Reviewed - No data to display No results found. 1. Bacterial skin infection of leg     MDM  Patient will f/u in clinic in a few days if symptoms worsen or not improved.  Voices understanding  Meds ordered this encounter  Medications  . cephALEXin (KEFLEX) 500 MG capsule    Sig: Take 1 capsule (500 mg total) by mouth every 6 (six) hours.    Dispense:  30 capsule    Refill:  0  . DISCONTD: TDaP (BOOSTRIX) injection 0.5 mL    Sig:     Zonia Kief, PA-C 06/26/13 1129

## 2013-11-14 ENCOUNTER — Emergency Department (HOSPITAL_COMMUNITY)
Admission: EM | Admit: 2013-11-14 | Discharge: 2013-11-14 | Disposition: A | Discharge status: Home / Self Care | Payer: BC Managed Care – PPO | Source: Home / Self Care | Attending: Emergency Medicine | Admitting: Emergency Medicine

## 2013-11-14 ENCOUNTER — Encounter (HOSPITAL_COMMUNITY): Payer: Self-pay | Admitting: Emergency Medicine

## 2013-11-14 DIAGNOSIS — K5289 Other specified noninfective gastroenteritis and colitis: Secondary | ICD-10-CM

## 2013-11-14 DIAGNOSIS — K529 Noninfective gastroenteritis and colitis, unspecified: Secondary | ICD-10-CM

## 2013-11-14 HISTORY — DX: Irritable bowel syndrome, unspecified: K58.9

## 2013-11-14 LAB — CBC WITH DIFFERENTIAL/PLATELET
Basophils Absolute: 0 10*3/uL (ref 0.0–0.1)
HCT: 42.9 % (ref 39.0–52.0)
Hemoglobin: 15.8 g/dL (ref 13.0–17.0)
Lymphocytes Relative: 32 % (ref 12–46)
Lymphs Abs: 2.6 10*3/uL (ref 0.7–4.0)
MCV: 87.7 fL (ref 78.0–100.0)
Monocytes Absolute: 0.5 10*3/uL (ref 0.1–1.0)
Neutro Abs: 4.9 10*3/uL (ref 1.7–7.7)
RBC: 4.89 MIL/uL (ref 4.22–5.81)
RDW: 12.3 % (ref 11.5–15.5)
WBC: 8 10*3/uL (ref 4.0–10.5)

## 2013-11-14 LAB — POCT I-STAT, CHEM 8
BUN: 11 mg/dL (ref 6–23)
Calcium, Ion: 1.29 mmol/L — ABNORMAL HIGH (ref 1.12–1.23)
Chloride: 102 mEq/L (ref 96–112)
Creatinine, Ser: 1.1 mg/dL (ref 0.50–1.35)

## 2013-11-14 MED ORDER — ONDANSETRON 4 MG PO TBDP
ORAL_TABLET | ORAL | Status: AC
  Filled 2013-11-14: qty 2

## 2013-11-14 MED ORDER — ONDANSETRON 8 MG PO TBDP: 8.0000 mg | ORAL_TABLET | Freq: Three times a day (TID) | ORAL | Status: DC | PRN

## 2013-11-14 MED ORDER — DIPHENOXYLATE-ATROPINE 2.5-0.025 MG PO TABS: 1.0000 | ORAL_TABLET | Freq: Four times a day (QID) | ORAL | Status: DC | PRN

## 2013-11-14 MED ORDER — ONDANSETRON 4 MG PO TBDP
8.0000 mg | ORAL_TABLET | Freq: Once | ORAL | Status: AC
  Administered 2013-11-14: 8 mg via ORAL

## 2013-11-14 NOTE — ED Notes (Signed)
Ginger ale provided.

## 2013-11-14 NOTE — ED Notes (Signed)
C/O intermittent dizziness, nausea, vomiting, diarrhea x2 days.  States sxs are starting to improve; able to keep down fluids and some food.  C/O intermittent abd cramping, but denies at present.

## 2013-11-14 NOTE — ED Provider Notes (Signed)
Chief Complaint:   Chief Complaint  Patient presents with  . Vomiting  . Diarrhea    History of Present Illness:   Mitchell Quinn is a 22 year old male who presents with a three-day history of headache, sweats, nasal congestion, nausea, vomiting, diarrhea, dizziness, and crampy abdominal pain. He had 3 episodes of emesis yesterday and one today. No blood in the emesis, coffee-ground emesis, or bilious emesis. He had 6 diarrheal stools yesterday and 3 today. No blood in the stool or melena. He has some irritation around his rectum. He's had no sick exposures. No suspicious ingestions, animal exposure, or foreign travel.  Review of Systems:  Other than noted above, the patient denies any of the following symptoms: Systemic:  No fevers, chills, sweats, weight loss or gain, fatigue, or tiredness. ENT:  No nasal congestion, rhinorrhea, or sore throat. Lungs:  No cough, wheezing, or shortness of breath. Cardiac:  No chest pain, syncope, or presyncope. GI:  No abdominal pain, nausea, vomiting, anorexia, diarrhea, constipation, blood in stool or vomitus. GU:  No dysuria, frequency, or urgency.  PMFSH:  Past medical history, family history, social history, meds, and allergies were reviewed.   Physical Exam:   Vital signs:  BP 135/84  Pulse 60  Temp(Src) 97.9 F (36.6 C) (Oral)  Resp 18  SpO2 98% General:  Alert and oriented.  In no distress.  Skin warm and dry.  Good skin turgor, brisk capillary refill. ENT:  No scleral icterus, moist mucous membranes, no oral lesions, pharynx clear. Lungs:  Breath sounds clear and equal bilaterally.  No wheezes, rales, or rhonchi. Heart:  Rhythm regular, without extrasystoles.  No gallops or murmers. Abdomen:  Soft, flat, nondistended. No organomegaly or mass. Bowel sounds were normally active. He has mild tenderness to palpation in the right lower quadrant without guarding or rebound. Skin: Clear, warm, and dry.  Good turgor.  Brisk capillary  refill.  Labs:   Results for orders placed during the hospital encounter of 11/14/13  CBC WITH DIFFERENTIAL      Result Value Range   WBC 8.0  4.0 - 10.5 K/uL   RBC 4.89  4.22 - 5.81 MIL/uL   Hemoglobin 15.8  13.0 - 17.0 g/dL   HCT 16.1  09.6 - 04.5 %   MCV 87.7  78.0 - 100.0 fL   MCH 32.3  26.0 - 34.0 pg   MCHC 36.8 (*) 30.0 - 36.0 g/dL   RDW 40.9  81.1 - 91.4 %   Platelets 309  150 - 400 K/uL   Neutrophils Relative % 61  43 - 77 %   Neutro Abs 4.9  1.7 - 7.7 K/uL   Lymphocytes Relative 32  12 - 46 %   Lymphs Abs 2.6  0.7 - 4.0 K/uL   Monocytes Relative 6  3 - 12 %   Monocytes Absolute 0.5  0.1 - 1.0 K/uL   Eosinophils Relative 1  0 - 5 %   Eosinophils Absolute 0.1  0.0 - 0.7 K/uL   Basophils Relative 0  0 - 1 %   Basophils Absolute 0.0  0.0 - 0.1 K/uL  POCT I-STAT, CHEM 8      Result Value Range   Sodium 140  135 - 145 mEq/L   Potassium 4.7  3.5 - 5.1 mEq/L   Chloride 102  96 - 112 mEq/L   BUN 11  6 - 23 mg/dL   Creatinine, Ser 7.82  0.50 - 1.35 mg/dL   Glucose, Bld 96  70 - 99 mg/dL   Calcium, Ion 1.91 (*) 1.12 - 1.23 mmol/L   TCO2 27  0 - 100 mmol/L   Hemoglobin 16.0  13.0 - 17.0 g/dL   HCT 47.8  29.5 - 62.1 %     Course in Urgent Care Center:   Given Zofran ODT 8 mg sublingually.  Assessment:  The encounter diagnosis was Gastroenteritis.  This appears to be viral given the mid-normal range white blood cell count. Even now he is a little tender in the right lower quadrant, it does not appear he has appendicitis. There is no evidence of dehydration.  Plan:   1.  Meds:  The following meds were prescribed:   Discharge Medication List as of 11/14/2013  5:54 PM    START taking these medications   Details  diphenoxylate-atropine (LOMOTIL) 2.5-0.025 MG per tablet Take 1 tablet by mouth 4 (four) times daily as needed for diarrhea or loose stools., Starting 11/14/2013, Until Discontinued, Print    ondansetron (ZOFRAN ODT) 8 MG disintegrating tablet Take 1 tablet (8 mg  total) by mouth every 8 (eight) hours as needed for nausea., Starting 11/14/2013, Until Discontinued, Normal        2.  Patient Education/Counseling:  The patient was given appropriate handouts, self care instructions, and instructed in symptomatic relief. The patient was told to stay on clear liquids for the remainder of the day, then advance to a B.R.A.T. diet starting tomorrow.  3.  Follow up:  The patient was told to follow up if no better in 3 to 4 days, if becoming worse in any way, and given some red flag symptoms such as persistent vomiting or worsening abdominal pain which would prompt immediate return.  Follow up here or at the emergency department as needed.       Reuben Likes, MD 11/14/13 [phone]

## 2014-02-10 ENCOUNTER — Encounter (HOSPITAL_COMMUNITY): Payer: Self-pay | Admitting: Emergency Medicine

## 2014-02-10 ENCOUNTER — Emergency Department (HOSPITAL_COMMUNITY)
Admission: EM | Admit: 2014-02-10 | Discharge: 2014-02-10 | Disposition: A | Discharge status: Home / Self Care | Payer: BC Managed Care – PPO | Source: Home / Self Care | Attending: Family Medicine | Admitting: Family Medicine

## 2014-02-10 DIAGNOSIS — R109 Unspecified abdominal pain: Secondary | ICD-10-CM

## 2014-02-10 MED ORDER — ONDANSETRON 4 MG PO TBDP
ORAL_TABLET | ORAL | Status: AC
  Filled 2014-02-10: qty 2

## 2014-02-10 MED ORDER — ONDANSETRON HCL 8 MG PO TABS: 8.0000 mg | ORAL_TABLET | Freq: Three times a day (TID) | ORAL | Status: DC | PRN

## 2014-02-10 MED ORDER — POLYETHYLENE GLYCOL 3350 17 GM/SCOOP PO POWD: 17.0000 g | Freq: Every day | ORAL | Status: DC

## 2014-02-10 MED ORDER — ONDANSETRON 4 MG PO TBDP
8.0000 mg | ORAL_TABLET | Freq: Once | ORAL | Status: AC
  Administered 2014-02-10: 8 mg via ORAL

## 2014-02-10 NOTE — ED Notes (Signed)
Abdominal pain, nausea, evaluated by dr Denyse Amasscorey prior to this nurse

## 2014-02-10 NOTE — ED Provider Notes (Signed)
Claudius SisJonathan P Ludden is a 23 y.o. male who presents to Urgent Care today for mild abdominal pain present for one day. This is also associated with nausea and belching. No vomiting or diarrhea. Mild constipation lastly yesterday. Patient has a history of IBS. No fevers or chills cough or trouble breathing. No blood in the stool. No medications tried yet.   Past Medical History  Diagnosis Date  . IBS (irritable bowel syndrome)    History  Substance Use Topics  . Smoking status: Never Smoker   . Smokeless tobacco: Not on file  . Alcohol Use: No   ROS as above Medications: No current facility-administered medications for this encounter.   Current Outpatient Prescriptions  Medication Sig Dispense Refill  . diphenoxylate-atropine (LOMOTIL) 2.5-0.025 MG per tablet Take 1 tablet by mouth 4 (four) times daily as needed for diarrhea or loose stools.  16 tablet  0  . ondansetron (ZOFRAN) 8 MG tablet Take 1 tablet (8 mg total) by mouth every 8 (eight) hours as needed for nausea or vomiting.  20 tablet  0  . polyethylene glycol powder (MIRALAX) powder Take 17 g by mouth daily.  850 g  1    Exam:  BP 116/80  Pulse 65  Temp(Src) 98.2 F (36.8 C) (Oral)  Resp 16  SpO2 100% Gen: Well NAD HEENT: EOMI,  MMM Lungs: Normal work of breathing. CTABL Heart: RRR no MRG Abd: NABS, Soft. NT, ND no rebound or guarding Exts: Brisk capillary refill, warm and well perfused.    Assessment and Plan: 23 y.o. male with abdominal pain. Likely secondary to IBS. Plan to use Zofran and MiraLAX. We'll also use watchful waiting. Followup with primary care provider  Discussed warning signs or symptoms. Please see discharge instructions. Patient expresses understanding.    Rodolph BongEvan S Clarion Mooneyhan, MD 02/10/14 2102

## 2014-02-10 NOTE — Discharge Instructions (Signed)
Thank you for coming in today. Take MiraLAX daily to have one soft bowel movement daily.  Use Zofran as needed for vomiting Followup with a primary care provider as needed If your belly pain worsens, or you have high fever, bad vomiting, blood in your stool or black tarry stool go to the Emergency Room.   Abdominal Pain, Adult Many things can cause abdominal pain. Usually, abdominal pain is not caused by a disease and will improve without treatment. It can often be observed and treated at home. Your health care provider will do a physical exam and possibly order blood tests and X-rays to help determine the seriousness of your pain. However, in many cases, more time must pass before a clear cause of the pain can be found. Before that point, your health care provider may not know if you need more testing or further treatment. HOME CARE INSTRUCTIONS  Monitor your abdominal pain for any changes. The following actions may help to alleviate any discomfort you are experiencing:  Only take over-the-counter or prescription medicines as directed by your health care provider.  Do not take laxatives unless directed to do so by your health care provider.  Try a clear liquid diet (broth, tea, or water) as directed by your health care provider. Slowly move to a bland diet as tolerated. SEEK MEDICAL CARE IF:  You have unexplained abdominal pain.  You have abdominal pain associated with nausea or diarrhea.  You have pain when you urinate or have a bowel movement.  You experience abdominal pain that wakes you in the night.  You have abdominal pain that is worsened or improved by eating food.  You have abdominal pain that is worsened with eating fatty foods. SEEK IMMEDIATE MEDICAL CARE IF:   Your pain does not go away within 2 hours.  You have a fever.  You keep throwing up (vomiting).  Your pain is felt only in portions of the abdomen, such as the right side or the left lower portion of the  abdomen.  You pass bloody or black tarry stools. MAKE SURE YOU:  Understand these instructions.   Will watch your condition.   Will get help right away if you are not doing well or get worse.  Document Released: 09/25/2005 Document Revised: 10/06/2013 Document Reviewed: 08/25/2013 [facility] Patient Information 2014 [place], [place].

## 2014-04-13 ENCOUNTER — Emergency Department (HOSPITAL_COMMUNITY)
Admission: EM | Admit: 2014-04-13 | Discharge: 2014-04-14 | Disposition: A | Discharge status: Home / Self Care | Payer: BC Managed Care – PPO | Attending: Emergency Medicine | Admitting: Emergency Medicine

## 2014-04-13 ENCOUNTER — Emergency Department (HOSPITAL_COMMUNITY): Payer: BC Managed Care – PPO

## 2014-04-13 ENCOUNTER — Encounter (HOSPITAL_COMMUNITY): Payer: Self-pay | Admitting: Emergency Medicine

## 2014-04-13 DIAGNOSIS — R0789 Other chest pain: Secondary | ICD-10-CM | POA: Insufficient documentation

## 2014-04-13 DIAGNOSIS — F17200 Nicotine dependence, unspecified, uncomplicated: Secondary | ICD-10-CM | POA: Insufficient documentation

## 2014-04-13 DIAGNOSIS — R0602 Shortness of breath: Secondary | ICD-10-CM

## 2014-04-13 DIAGNOSIS — R42 Dizziness and giddiness: Secondary | ICD-10-CM | POA: Insufficient documentation

## 2014-04-13 NOTE — ED Notes (Signed)
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

## 2014-04-14 NOTE — ED Provider Notes (Signed)
CSN: [id number]     Arrival date & time 04/13/14  2243 History   First MD Initiated Contact with Patient 04/13/14 2324     Chief Complaint  Patient presents with  . Shortness of Breath    (Consider location/radiation/quality/duration/timing/severity/associated sxs/prior Treatment) HPI Comments: Patient is a 23 year old male with a history of IBS who presents to the emergency department for shortness of breath. Patient states that he has had episodes of shortness of breath over the last month. He states that episodes last approximately one to 2 hours before resolving spontaneously. Symptoms associated with nausea and lightheadedness. He states that symptoms usually occur with activity, but have occurred at rest. He states that, consistently, symptoms began after smoking cigarettes. Patient denies associated fever, syncope, hemoptysis, chest pain, extremity weakness, and jaw pain. He denies a FHx of ACS under the age of 25. He denies a hx of hear disease, HTN, and coagulopathies. No recent surgeries or hospitalizations. He states he has been under more stress recently with a new baby at home.  Patient is a 23 y.o. male presenting with shortness of breath. The history is provided by the patient. No language interpreter was used.  Shortness of Breath Associated symptoms: no chest pain and no fever     Past Medical History  Diagnosis Date  . IBS (irritable bowel syndrome)    Past Surgical History  Procedure Laterality Date  . Orif wrist fracture     No family history on file. History  Substance Use Topics  . Smoking status: Never Smoker   . Smokeless tobacco: Not on file  . Alcohol Use: No    Review of Systems  Constitutional: Negative for fever.  Respiratory: Positive for chest tightness and shortness of breath.   Cardiovascular: Negative for chest pain.  Neurological: Positive for light-headedness. Negative for syncope.  All other systems reviewed and are  negative.     Allergies  Review of patient's allergies indicates no known allergies.  Home Medications   Prior to Admission medications   Medication Sig Start Date End Date Taking? Authorizing Provider  acetaminophen (TYLENOL) 500 MG tablet Take 1,000 mg by mouth every 6 (six) hours as needed for headache.   Yes Historical Provider, MD  ibuprofen (ADVIL,MOTRIN) 200 MG tablet Take 400 mg by mouth every 6 (six) hours as needed for moderate pain.   Yes Historical Provider, MD   BP 129/87  Pulse 61  Temp(Src) 97.8 F (36.6 C) (Oral)  Resp 18  Ht 5\' 11"  (1.803 m)  Wt 248 lb (112.492 kg)  BMI 34.60 kg/m2  SpO2 99%  Physical Exam  Nursing note and vitals reviewed. Constitutional: He is oriented to person, place, and time. He appears well-developed and well-nourished. No distress.  HENT:  Head: Normocephalic and atraumatic.  Mouth/Throat: Oropharynx is clear and moist. No oropharyngeal exudate.  Eyes: Conjunctivae and EOM are normal. Pupils are equal, round, and reactive to light. No scleral icterus.  Neck: Normal range of motion.  Cardiovascular: Normal rate, regular rhythm and normal heart sounds.   Pulmonary/Chest: Effort normal and breath sounds normal. No respiratory distress. He has no wheezes. He has no rales.  No tachypnea, dyspnea, retractions, or accessory muscle use. Chest expansion symmetric.  Musculoskeletal: Normal range of motion.  Neurological: He is alert and oriented to person, place, and time.  GCS 15. Patient speaks in full goal oriented sentences. He moves his extremities without ataxia.  Skin: Skin is warm and dry. No rash noted. He is not diaphoretic.  No erythema. No pallor.  Psychiatric: He has a normal mood and affect. His behavior is normal.    ED Course  Procedures (including critical care time) Labs Review Labs Reviewed - No data to display  Imaging Review Dg Chest 2 View  04/13/2014   CLINICAL DATA:  SHORTNESS OF BREATH  EXAM: CHEST  2 VIEW   COMPARISON:  DG FLUORO GUIDE NDL PLC/BX dated 11/05/2006; DG CHEST 2 VIEW dated 08/25/2006  FINDINGS: The heart size and mediastinal contours are within normal limits. Both lungs are clear. The visualized skeletal structures are unremarkable.  IMPRESSION: No active cardiopulmonary disease.   Electronically Signed   By: Elige KoHetal  Patel   On: 04/13/2014 23:55     EKG Interpretation   Date/Time:  Wednesday April 13 2014 23:42:20 EDT Ventricular Rate:  57 PR Interval:  189 QRS Duration: 102 QT Interval:  420 QTC Calculation: 409 R Axis:   61 Text Interpretation:  Sinus rhythm ST elev, probable normal early repol  pattern No old tracing to compare Confirmed by OTTER  MD, OLGA ([id number]) on  04/14/2014 12:15:41 AM      MDM   Final diagnoses:  Shortness of breath    23 year old male presents for shortness of breath. Symptom onset frequently after smoking cigarettes. Patient denies chest pain and syncope. EKG today shows sinus rhythm with changes consistent with early repolarization. No evidence of acute cardiopulmonary changes on x-ray. Doubt ACS in this patient. Heart Score is 0. Also doubt PE; patient is PERC negative. ? Stress vs anxiety vs adverse cigarette smoking reaction. Patient stable and appropriate for discharge today with instruction to followup with cardiology or his primary care provider for scheduling of an outpatient stress test. I also advised the patient discontinue smoking. Return precautions provided and patient agreeable to plan with no unaddressed concerns.   Filed Vitals:   04/13/14 2249  BP: 129/87  Pulse: 61  Temp: 97.8 F (36.6 C)  TempSrc: Oral  Resp: 18  Height: 5\' 11"  (1.803 m)  Weight: 248 lb (112.492 kg)  SpO2: 99%     Antony MaduraKelly Lakhia Gengler, PA-C 04/14/14 [phone]

## 2014-04-14 NOTE — Discharge Instructions (Signed)
Discontinue smoking. Recommend follow up with cardiology for further evaluation of symptoms and scheduling of a stress test. Return if symptoms worsen.  Shortness of Breath Shortness of breath means you have trouble breathing. Shortness of breath needs medical care right away. HOME CARE   Do not smoke.  Avoid being around chemicals or things (paint fumes, dust) that may bother your breathing.  Rest as needed. Slowly begin your normal activities.  Only take medicines as told by your doctor.  Keep all doctor visits as told. GET HELP RIGHT AWAY IF:   Your shortness of breath gets worse.  You feel lightheaded, pass out (faint), or have a cough that is not helped by medicine.  You cough up blood.  You have pain with breathing.  You have pain in your chest, arms, shoulders, or belly (abdomen).  You have a fever.  You cannot walk up stairs or exercise the way you normally do.  You do not get better in the time expected.  You have a hard time doing normal activities even with rest.  You have problems with your medicines.  You have any new symptoms. MAKE SURE YOU:  Understand these instructions.  Will watch your condition.  Will get help right away if you are not doing well or get worse. Document Released: 06/03/2008 Document Revised: 06/16/2012 Document Reviewed: 03/02/2012 [facility] Patient Information 2014 [place]ExitCare, [place].

## 2014-04-14 NOTE — ED Provider Notes (Signed)
Medical screening examination/treatment/procedure(s) were performed by non-physician practitioner and as supervising physician I was immediately available for consultation/collaboration.   EKG Interpretation   Date/Time:  Wednesday April 13 2014 23:42:20 EDT Ventricular Rate:  57 PR Interval:  189 QRS Duration: 102 QT Interval:  420 QTC Calculation: 409 R Axis:   61 Text Interpretation:  Sinus rhythm ST elev, probable normal early repol  pattern No old tracing to compare Confirmed by Tejuan Gholson  MD, Jarrell Armond ([id number]) on  04/14/2014 12:15:41 AM       Olivia Mackielga M Nell Schrack, MD 04/14/14 [phone]

## 2014-08-05 ENCOUNTER — Emergency Department (HOSPITAL_BASED_OUTPATIENT_CLINIC_OR_DEPARTMENT_OTHER): Payer: BC Managed Care – PPO | Attending: Emergency Medicine

## 2014-08-05 ENCOUNTER — Encounter (HOSPITAL_BASED_OUTPATIENT_CLINIC_OR_DEPARTMENT_OTHER): Payer: Self-pay | Admitting: Emergency Medicine

## 2014-08-05 ENCOUNTER — Emergency Department (HOSPITAL_BASED_OUTPATIENT_CLINIC_OR_DEPARTMENT_OTHER)
Admission: EM | Admit: 2014-08-05 | Discharge: 2014-08-05 | Disposition: A | Discharge status: Home / Self Care | Payer: Worker's Compensation | Attending: Emergency Medicine | Admitting: Emergency Medicine

## 2014-08-05 DIAGNOSIS — IMO0002 Reserved for concepts with insufficient information to code with codable children: Secondary | ICD-10-CM | POA: Diagnosis not present

## 2014-08-05 DIAGNOSIS — S9030XA Contusion of unspecified foot, initial encounter: Secondary | ICD-10-CM | POA: Insufficient documentation

## 2014-08-05 DIAGNOSIS — S8990XA Unspecified injury of unspecified lower leg, initial encounter: Secondary | ICD-10-CM | POA: Insufficient documentation

## 2014-08-05 DIAGNOSIS — S99919A Unspecified injury of unspecified ankle, initial encounter: Secondary | ICD-10-CM | POA: Diagnosis present

## 2014-08-05 DIAGNOSIS — Z8719 Personal history of other diseases of the digestive system: Secondary | ICD-10-CM | POA: Insufficient documentation

## 2014-08-05 DIAGNOSIS — S99929A Unspecified injury of unspecified foot, initial encounter: Secondary | ICD-10-CM

## 2014-08-05 DIAGNOSIS — S9032XA Contusion of left foot, initial encounter: Secondary | ICD-10-CM

## 2014-08-05 DIAGNOSIS — Y929 Unspecified place or not applicable: Secondary | ICD-10-CM | POA: Insufficient documentation

## 2014-08-05 DIAGNOSIS — Y9389 Activity, other specified: Secondary | ICD-10-CM | POA: Insufficient documentation

## 2014-08-05 DIAGNOSIS — Z79899 Other long term (current) drug therapy: Secondary | ICD-10-CM | POA: Diagnosis not present

## 2014-08-05 MED ORDER — IBUPROFEN 800 MG PO TABS: 800.0000 mg | ORAL_TABLET | Freq: Three times a day (TID) | ORAL | Status: DC

## 2014-08-05 NOTE — ED Notes (Signed)
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

## 2014-08-05 NOTE — ED Provider Notes (Signed)
CSN: [id number]     Arrival date & time 08/05/14  1953 History   First MD Initiated Contact with Patient 08/05/14 2106     Chief Complaint  Patient presents with  . Foot Injury     (Consider location/radiation/quality/duration/timing/severity/associated sxs/prior Treatment) Patient is a 23 y.o. male presenting with foot injury. The history is provided by the patient. No language interpreter was used.  Foot Injury Location:  Foot Injury: yes   Foot location:  L foot Associated symptoms: no fever   Associated symptoms comment:  He states left foot/ankle injured by a machine that struck him at posterior heel yesterday causing bruising and swelling. He states it hurts to bear weight on the heel of the foot. No other injury.   Past Medical History  Diagnosis Date  . IBS (irritable bowel syndrome)    Past Surgical History  Procedure Laterality Date  . Orif wrist fracture     No family history on file. History  Substance Use Topics  . Smoking status: Never Smoker   . Smokeless tobacco: Not on file  . Alcohol Use: No    Review of Systems  Constitutional: Negative for fever and chills.  Musculoskeletal:       See HPI  Skin: Positive for color change.  Neurological: Positive for numbness.       Numbness isolated to posterior heel at point of impact.      Allergies  Review of patient's allergies indicates no known allergies.  Home Medications   Prior to Admission medications   Medication Sig Start Date End Date Taking? Authorizing Provider  acetaminophen (TYLENOL) 500 MG tablet Take 1,000 mg by mouth every 6 (six) hours as needed for headache.    Historical Provider, MD  ibuprofen (ADVIL,MOTRIN) 200 MG tablet Take 400 mg by mouth every 6 (six) hours as needed for moderate pain.    Historical Provider, MD   BP 115/64  Pulse 57  Temp(Src) 98.3 F (36.8 C) (Oral)  Resp 18  Ht 5\' 11"  (1.803 m)  Wt 258 lb (117.028 kg)  BMI 36.00 kg/m2  SpO2 99% Physical Exam   Constitutional: He is oriented to person, place, and time. He appears well-developed and well-nourished.  Neck: Normal range of motion.  Pulmonary/Chest: Effort normal.  Musculoskeletal: Normal range of motion.  Left foot has ecchymosis surrounding heel without bony deformity. Ankle joint without laxity. Achilles tendon appears intact.   Neurological: He is alert and oriented to person, place, and time.  Skin: Skin is warm and dry.  Psychiatric: He has a normal mood and affect.    ED Course  Procedures (including critical care time) Labs Review Labs Reviewed - No data to display  Imaging Review Dg Foot Complete Left  08/05/2014   CLINICAL DATA:  FOOT INJURY. Injury from a forklift. Lateral pain, posterior heel pain and bruising.  EXAM: LEFT FOOT - COMPLETE 3+ VIEW  COMPARISON:  None.  FINDINGS: There is no evidence of fracture or dislocation. There is no evidence of arthropathy or other focal bone abnormality. Soft tissues are unremarkable.  IMPRESSION: Negative.   Electronically Signed   By: Rosalie GumsBeth  Brown M.D.   On: 08/05/2014 20:47     EKG Interpretation None      MDM   Final diagnoses:  None    1. Contusion left foot  Injury limited to contusion of heel without fracture. No evidence to suggest tendon injury or rupture. Work limitations set. Ice - elevation, ibuprofen.    Ocie CornfieldShari A  Leman Martinek, PA-C 08/05/14 2205

## 2014-08-05 NOTE — Discharge Instructions (Signed)
Contusion °A contusion is a deep bruise. Contusions happen when an injury causes bleeding under the skin. Signs of bruising include pain, puffiness (swelling), and discolored skin. The contusion may turn blue, purple, or yellow. °HOME CARE  °· Put ice on the injured area. °¨ Put ice in a plastic bag. °¨ Place a towel between your skin and the bag. °¨ Leave the ice on for 15-20 minutes, 03-04 times a day. °· Only take medicine as told by your doctor. °· Rest the injured area. °· If possible, raise (elevate) the injured area to lessen puffiness. °GET HELP RIGHT AWAY IF:  °· You have more bruising or puffiness. °· You have pain that is getting worse. °· Your puffiness or pain is not helped by medicine. °MAKE SURE YOU:  °· Understand these instructions. °· Will watch your condition. °· Will get help right away if you are not doing well or get worse. °Document Released: 06/03/2008 Document Revised: 03/09/2012 Document Reviewed: 10/21/2011 °ExitCare® Patient Information ©2015 [employer]. This information is not intended to replace advice given to you by your health care provider. Make sure you discuss any questions you have with your health care provider. ° °Cryotherapy °Cryotherapy means treatment with cold. Ice or gel packs can be used to reduce both pain and swelling. Ice is the most helpful within the first 24 to 48 hours after an injury or flare-up from overusing a muscle or joint. Sprains, strains, spasms, burning pain, shooting pain, and aches can all be eased with ice. Ice can also be used when recovering from surgery. Ice is effective, has very few side effects, and is safe for most people to use. °PRECAUTIONS  °Ice is not a safe treatment option for people with: °· Raynaud phenomenon. This is a condition affecting small blood vessels in the extremities. Exposure to cold may cause your problems to return. °· Cold hypersensitivity. There are many forms of cold hypersensitivity, including: °¨ Cold urticaria.  Red, itchy hives appear on the skin when the tissues begin to warm after being iced. °¨ Cold erythema. This is a red, itchy rash caused by exposure to cold. °¨ Cold hemoglobinuria. Red blood cells break down when the tissues begin to warm after being iced. The hemoglobin that carry oxygen are passed into the urine because they cannot combine with blood proteins fast enough. °· Numbness or altered sensitivity in the area being iced. °If you have any of the following conditions, do not use ice until you have discussed cryotherapy with your caregiver: °· Heart conditions, such as arrhythmia, angina, or chronic heart disease. °· High blood pressure. °· Healing wounds or open skin in the area being iced. °· Current infections. °· Rheumatoid arthritis. °· Poor circulation. °· Diabetes. °Ice slows the blood flow in the region it is applied. This is beneficial when trying to stop inflamed tissues from spreading irritating chemicals to surrounding tissues. However, if you expose your skin to cold temperatures for too long or without the proper protection, you can damage your skin or nerves. Watch for signs of skin damage due to cold. °HOME CARE INSTRUCTIONS °Follow these tips to use ice and cold packs safely. °· Place a dry or damp towel between the ice and skin. A damp towel will cool the skin more quickly, so you may need to shorten the time that the ice is used. °· For a more rapid response, add gentle compression to the ice. °· Ice for no more than 10 to 20 minutes at a time.   The bonier the area you are icing, the less time it will take to get the benefits of ice. °· Check your skin after 5 minutes to make sure there are no signs of a poor response to cold or skin damage. °· Rest 20 minutes or more between uses. °· Once your skin is numb, you can end your treatment. You can test numbness by very lightly touching your skin. The touch should be so light that you do not see the skin dimple from the pressure of your  fingertip. When using ice, most people will feel these normal sensations in this order: cold, burning, aching, and numbness. °· Do not use ice on someone who cannot communicate their responses to pain, such as small children or people with dementia. °HOW TO MAKE AN ICE PACK °Ice packs are the most common way to use ice therapy. Other methods include ice massage, ice baths, and cryosprays. Muscle creams that cause a cold, tingly feeling do not offer the same benefits that ice offers and should not be used as a substitute unless recommended by your caregiver. °To make an ice pack, do one of the following: °· Place crushed ice or a bag of frozen vegetables in a sealable plastic bag. Squeeze out the excess air. Place this bag inside another plastic bag. Slide the bag into a pillowcase or place a damp towel between your skin and the bag. °· Mix 3 parts water with 1 part rubbing alcohol. Freeze the mixture in a sealable plastic bag. When you remove the mixture from the freezer, it will be slushy. Squeeze out the excess air. Place this bag inside another plastic bag. Slide the bag into a pillowcase or place a damp towel between your skin and the bag. °SEEK MEDICAL CARE IF: °· You develop white spots on your skin. This may give the skin a blotchy (mottled) appearance. °· Your skin turns blue or pale. °· Your skin becomes waxy or hard. °· Your swelling gets worse. °MAKE SURE YOU:  °· Understand these instructions. °· Will watch your condition. °· Will get help right away if you are not doing well or get worse. °Document Released: 08/12/2011 Document Revised: 05/02/2014 Document Reviewed: 08/12/2011 °ExitCare® Patient Information ©2015 [employer]. This information is not intended to replace advice given to you by your health care provider. Make sure you discuss any questions you have with your health care provider. ° °

## 2014-08-05 NOTE — ED Notes (Signed)
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

## 2014-08-06 NOTE — ED Provider Notes (Signed)
Medical screening examination/treatment/procedure(s) were performed by non-physician practitioner and as supervising physician I was immediately available for consultation/collaboration.    Audree CamelScott T Sherril Heyward, MD 08/06/14 [phone]

## 2015-07-06 ENCOUNTER — Emergency Department (HOSPITAL_COMMUNITY)
Admission: EM | Admit: 2015-07-06 | Discharge: 2015-07-06 | Disposition: A | Discharge status: Home / Self Care | Payer: Managed Care, Other (non HMO) | Source: Home / Self Care | Attending: Family Medicine | Admitting: Family Medicine

## 2015-07-06 DIAGNOSIS — T679XXA Effect of heat and light, unspecified, initial encounter: Secondary | ICD-10-CM

## 2015-07-06 LAB — POCT I-STAT, CHEM 8
BUN: 13 mg/dL (ref 6–20)
CHLORIDE: 102 mmol/L (ref 101–111)
Calcium, Ion: 1.27 mmol/L — ABNORMAL HIGH (ref 1.12–1.23)
Creatinine, Ser: 1 mg/dL (ref 0.61–1.24)
Glucose, Bld: 101 mg/dL — ABNORMAL HIGH (ref 65–99)
HEMATOCRIT: 44 % (ref 39.0–52.0)
Hemoglobin: 15 g/dL (ref 13.0–17.0)
Potassium: 4.3 mmol/L (ref 3.5–5.1)
Sodium: 141 mmol/L (ref 135–145)
TCO2: 26 mmol/L (ref 0–100)

## 2015-07-06 MED ORDER — PROMETHAZINE HCL 25 MG PO TABS: 25.0000 mg | ORAL_TABLET | Freq: Four times a day (QID) | ORAL | Status: DC | PRN

## 2015-07-06 NOTE — ED Provider Notes (Signed)
CSN: [id number]     Arrival date & time 07/06/15  1554 History   First MD Initiated Contact with Patient 07/06/15 1724     Chief Complaint  Patient presents with  . Dizziness   (Consider location/radiation/quality/duration/timing/severity/associated sxs/prior Treatment) Patient is a 24 y.o. male presenting with dizziness. The history is provided by the patient.  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   Past Medical History  Diagnosis Date  . IBS (irritable bowel syndrome)    Past Surgical History  Procedure Laterality Date  . Orif wrist fracture     No family history on file. History  Substance Use Topics  . Smoking status: Never Smoker   . Smokeless tobacco: Not on file  . Alcohol Use: No    Review of Systems  Constitutional: Negative.   Respiratory: Negative.   Cardiovascular: Negative.   Gastrointestinal: Positive for nausea and vomiting.  Genitourinary: Negative.   Neurological: Positive for dizziness.    Allergies  Review of patient's allergies indicates no known allergies.  Home Medications   Prior to Admission medications   Medication Sig Start Date End Date Taking? Authorizing Provider  acetaminophen (TYLENOL) 500 MG tablet Take 1,000 mg by mouth every 6 (six) hours as needed for headache.    Historical Provider, MD  ibuprofen (ADVIL,MOTRIN) 200 MG tablet Take 400 mg by mouth every 6 (six) hours as needed for moderate pain.    Historical Provider, MD  ibuprofen (ADVIL,MOTRIN) 800 MG tablet Take 1 tablet (800 mg total) by mouth 3 (three) times daily. 08/05/14   Elpidio AnisShari Upstill, PA-C  promethazine (PHENERGAN) 25 MG  tablet Take 1 tablet (25 mg total) by mouth every 6 (six) hours as needed for nausea or vomiting. 07/06/15   Linna HoffJames D Kindl, MD   BP 101/68 mmHg  Pulse 58  Temp(Src) 98.2 F (36.8 C) (Oral)  Resp 16  SpO2 96% Physical Exam  Constitutional: He is oriented to person, place, and time. He appears well-developed and well-nourished. No distress.  Eyes: Conjunctivae are normal. Pupils are equal, round, and reactive to light.  Neck: Normal range of motion. Neck supple.  Cardiovascular: Normal heart sounds and intact distal pulses.   Pulmonary/Chest: Effort normal and breath sounds normal.  Lymphadenopathy:    He has no cervical adenopathy.  Neurological: He is alert and oriented to person, place, and time.  Skin: Skin is warm and dry.  Nursing note and vitals reviewed.   ED Course  Procedures (including critical care time) Labs Review Labs Reviewed  POCT I-STAT, CHEM 8 - Abnormal; Notable for the following:    Glucose, Bld 101 (*)    Calcium, Ion 1.27 (*)    All other components within normal limits   i-stat wnl. Imaging Review No results found.   MDM   1. Heat effects, initial encounter        Linna HoffJames D Kindl, MD 07/06/15 1759

## 2015-07-06 NOTE — Discharge Instructions (Signed)
Increase fluids, use medicine as needed for nausea, see your doctor if further problems.

## 2016-02-01 ENCOUNTER — Emergency Department (HOSPITAL_COMMUNITY)
Admission: EM | Admit: 2016-02-01 | Discharge: 2016-02-01 | Disposition: A | Discharge status: Home / Self Care | Payer: BLUE CROSS/BLUE SHIELD | Source: Home / Self Care | Attending: Emergency Medicine | Admitting: Emergency Medicine

## 2016-02-01 ENCOUNTER — Encounter (HOSPITAL_COMMUNITY): Payer: Self-pay | Admitting: Emergency Medicine

## 2016-02-01 DIAGNOSIS — M545 Low back pain, unspecified: Secondary | ICD-10-CM

## 2016-02-01 LAB — POCT URINALYSIS DIP (DEVICE)
BILIRUBIN URINE: NEGATIVE
GLUCOSE, UA: NEGATIVE mg/dL
Hgb urine dipstick: NEGATIVE
Ketones, ur: NEGATIVE mg/dL
LEUKOCYTES UA: NEGATIVE
NITRITE: NEGATIVE
Protein, ur: NEGATIVE mg/dL
SPECIFIC GRAVITY, URINE: 1.02 (ref 1.005–1.030)
UROBILINOGEN UA: 0.2 mg/dL (ref 0.0–1.0)
pH: 7 (ref 5.0–8.0)

## 2016-02-01 MED ORDER — KETOROLAC TROMETHAMINE 30 MG/ML IJ SOLN
INTRAMUSCULAR | Status: AC
  Filled 2016-02-01: qty 1

## 2016-02-01 MED ORDER — KETOROLAC TROMETHAMINE 30 MG/ML IJ SOLN
30.0000 mg | Freq: Once | INTRAMUSCULAR | Status: AC
  Administered 2016-02-01: 30 mg via INTRAMUSCULAR

## 2016-02-01 MED ORDER — NAPROXEN 500 MG PO TABS: 500.0000 mg | ORAL_TABLET | Freq: Two times a day (BID) | ORAL | Status: DC

## 2016-02-01 NOTE — ED Notes (Signed)
Right flank pain radiating toward the back.  Pain started at 2:00pm.  Throbbing, sharp pain that is increasing whether he is still or moving.  Movement intensifies pain.  Patient denies any urinary symptoms.  No known injury.  Last bm was today per patient, and normal.

## 2016-02-01 NOTE — Discharge Instructions (Signed)

## 2016-02-01 NOTE — ED Provider Notes (Addendum)
CSN: [id number]     Arrival date & time 02/01/16  1857 History   First MD Initiated Contact with Patient 02/01/16 1946     Chief Complaint  Patient presents with  . Flank Pain   (Consider location/radiation/quality/duration/timing/severity/associated sxs/prior Treatment) HPI History obtained from patient:   LOCATION:right flank SEVERITY:4 DURATION:since 2 pm CONTEXT:sudden onset QUALITY:dull ache MODIFYING FACTORS:none ASSOCIATED SYMPTOMS:a little nausea TIMING:constant OCCUPATION:warehouse  Past Medical History  Diagnosis Date  . IBS (irritable bowel syndrome)    Past Surgical History  Procedure Laterality Date  . Orif wrist fracture     No family history on file. Social History  Substance Use Topics  . Smoking status: Never Smoker   . Smokeless tobacco: None  . Alcohol Use: No    Review of Systems ROS +'ve right flank pain  Denies: HEADACHE, NAUSEA, ABDOMINAL PAIN, CHEST PAIN, CONGESTION, DYSURIA, SHORTNESS OF BREATH, NO HISTORY OF KIDNEY STONES  Allergies  Review of patient's allergies indicates no known allergies.  Home Medications   Prior to Admission medications   Medication Sig Start Date End Date Taking? Authorizing Provider  acetaminophen (TYLENOL) 500 MG tablet Take 1,000 mg by mouth every 6 (six) hours as needed for headache.    Historical Provider, MD  ibuprofen (ADVIL,MOTRIN) 200 MG tablet Take 400 mg by mouth every 6 (six) hours as needed for moderate pain.    Historical Provider, MD  ibuprofen (ADVIL,MOTRIN) 800 MG tablet Take 1 tablet (800 mg total) by mouth 3 (three) times daily. 08/05/14   Elpidio Anis, PA-C  promethazine (PHENERGAN) 25 MG tablet Take 1 tablet (25 mg total) by mouth every 6 (six) hours as needed for nausea or vomiting. 07/06/15   Linna Hoff, MD   Meds Ordered and Administered this Visit   Medications  ketorolac (TORADOL) 30 MG/ML injection 30 mg (not administered)    BP 130/89 mmHg  Pulse 78  Temp(Src) 98.2 F (36.8 C)  (Oral)  Resp 16  SpO2 98% No data found.   Physical Exam  Constitutional: He is oriented to person, place, and time. He appears well-developed and well-nourished. No distress.  HENT:  Head: Normocephalic and atraumatic.  Mouth/Throat: Oropharynx is clear and moist.  Eyes: Conjunctivae are normal.  Pulmonary/Chest: Effort normal and breath sounds normal.  Abdominal: Soft. Bowel sounds are normal.  Right CVA-T  Musculoskeletal: Normal range of motion.  Neurological: He is alert and oriented to person, place, and time.  Skin: Skin is warm and dry.  Psychiatric: He has a normal mood and affect. His behavior is normal. Judgment and thought content normal.  Nursing note and vitals reviewed.   ED Course  Procedures (including critical care time)  Labs Review Labs Reviewed - No data to display  Imaging Review No results found.   Visual Acuity Review  Right Eye Distance:   Left Eye Distance:   Bilateral Distance:    Right Eye Near:   Left Eye Near:    Bilateral Near:       UA is neg for blood. Treat as MS back pain  Administration of toradol given prior to discharge.  MDM   1. Right-sided low back pain without sciatica    Patient is advised to continue home symptomatic treatment. Prescription for naproxen  sent pharmacy patient has indicated. Patient is advised that if there are new or worsening symptoms or attend the emergency department, or contact primary care provider. Instructions of care provided discharged home in stable condition. Return to work/school note provided.  THIS NOTE WAS GENERATED USING A VOICE RECOGNITION SOFTWARE PROGRAM. ALL REASONABLE EFFORTS  WERE MADE TO PROOFREAD THIS DOCUMENT FOR ACCURACY.     Tharon Aquas, PA 02/01/16 2017  Tharon Aquas, PA 02/15/16 [phone]

## 2017-12-24 ENCOUNTER — Ambulatory Visit (HOSPITAL_COMMUNITY)
Admission: EM | Admit: 2017-12-24 | Discharge: 2017-12-24 | Disposition: A | Discharge status: Home / Self Care | Payer: Managed Care, Other (non HMO) | Attending: Family Medicine | Admitting: Family Medicine

## 2017-12-24 ENCOUNTER — Other Ambulatory Visit: Payer: Self-pay

## 2017-12-24 ENCOUNTER — Encounter (HOSPITAL_COMMUNITY): Payer: Self-pay | Admitting: Emergency Medicine

## 2017-12-24 DIAGNOSIS — A09 Infectious gastroenteritis and colitis, unspecified: Secondary | ICD-10-CM | POA: Diagnosis not present

## 2017-12-24 MED ORDER — ONDANSETRON 8 MG PO TBDP: 8.0000 mg | ORAL_TABLET | Freq: Three times a day (TID) | ORAL | 0 refills | Status: DC | PRN

## 2017-12-24 MED ORDER — LOPERAMIDE HCL 2 MG PO CAPS: 2.0000 mg | ORAL_CAPSULE | Freq: Four times a day (QID) | ORAL | 0 refills | Status: DC | PRN

## 2017-12-24 NOTE — ED Provider Notes (Signed)
  [facility]   [id number] 12/24/17 Arrival Time: 1858   SUBJECTIVE:  Mitchell Quinn is a 26 y.o. male who presents to the urgent care with complaint of nausea, no vomiting.  Also has had diarrhea (7-8 episodes).  Patient feels bad in general.  No abdominal pain, but no appetite.   Past Medical History:  Diagnosis Date  . IBS (irritable bowel syndrome)    Family History  Problem Relation Age of Onset  . Healthy Mother   . Healthy Father    Social History   Socioeconomic History  . Marital status: Married    Spouse name: Not on file  . Number of children: Not on file  . Years of education: Not on file  . Highest education level: Not on file  Social Needs  . Financial resource strain: Not on file  . Food insecurity - worry: Not on file  . Food insecurity - inability: Not on file  . Transportation needs - medical: Not on file  . Transportation needs - non-medical: Not on file  Occupational History  . Not on file  Tobacco Use  . Smoking status: Never Smoker  Substance and Sexual Activity  . Alcohol use: No  . Drug use: No  . Sexual activity: Not on file  Other Topics Concern  . Not on file  Social History Narrative  . Not on file   No outpatient medications have been marked as taking for the 12/24/17 encounter [facility](Hospital Encounter).   No Known Allergies    ROS: As per HPI, remainder of ROS negative.   OBJECTIVE:   Vitals:   12/24/17 1918  BP: 133/88  Pulse: 73  Resp: 18  Temp: 99.1 F (37.3 C)  TempSrc: Oral  SpO2: 97%     General appearance: alert; no distress Eyes: PERRL; EOMI; conjunctiva normal HENT: normocephalic; atraumatic; TMs normal, canal normal, external ears normal without trauma; nasal mucosa normal; oral mucosa normal Neck: supple Lungs: clear to auscultation bilaterally Heart: regular rate and rhythm Abdomen: soft, non-tender; bowel sounds normal; no masses or organomegaly; no guarding or rebound tenderness Back: no  CVA tenderness Extremities: no cyanosis or edema; symmetrical with no gross deformities Skin: warm and dry Neurologic: normal gait; grossly normal Psychological: alert and cooperative; normal mood and affect      Labs:  Results for orders placed or performed during the hospital encounter of 02/01/16  POCT urinalysis dip (device)  Result Value Ref Range   Glucose, UA NEGATIVE NEGATIVE mg/dL   Bilirubin Urine NEGATIVE NEGATIVE   Ketones, ur NEGATIVE NEGATIVE mg/dL   Specific Gravity, Urine 1.020 1.005 - 1.030   Hgb urine dipstick NEGATIVE NEGATIVE   pH 7.0 5.0 - 8.0   Protein, ur NEGATIVE NEGATIVE mg/dL   Urobilinogen, UA 0.2 0.0 - 1.0 mg/dL   Nitrite NEGATIVE NEGATIVE   Leukocytes, UA NEGATIVE NEGATIVE    Labs Reviewed - No data to display  No results found.     ASSESSMENT & PLAN:  No diagnosis found.  No orders of the defined types were placed in this encounter.   Reviewed expectations re: course of current medical issues. Questions answered. Outlined signs and symptoms indicating need for more acute intervention. Patient verbalized understanding. After Visit Summary given.    Procedures:      Elvina SidleLauenstein, Maleka Contino, MD 12/24/17 1942

## 2017-12-24 NOTE — Discharge Instructions (Signed)
Try to stay hydrated.  Return if vomiting begins.

## 2017-12-24 NOTE — ED Triage Notes (Signed)
Onset today of nausea, no vomiting.  Also has had diarrhea (7-8 episodes).  Patient feels bad in general.  No abdominal pain, but no appetite.

## 2017-12-26 ENCOUNTER — Ambulatory Visit (HOSPITAL_COMMUNITY)
Admission: EM | Admit: 2017-12-26 | Discharge: 2017-12-26 | Disposition: A | Discharge status: Home / Self Care | Payer: Managed Care, Other (non HMO) | Attending: Internal Medicine | Admitting: Internal Medicine

## 2017-12-26 ENCOUNTER — Other Ambulatory Visit: Payer: Self-pay

## 2017-12-26 ENCOUNTER — Encounter (HOSPITAL_COMMUNITY): Payer: Self-pay | Admitting: Emergency Medicine

## 2017-12-26 DIAGNOSIS — R110 Nausea: Secondary | ICD-10-CM | POA: Diagnosis not present

## 2017-12-26 MED ORDER — ONDANSETRON 8 MG PO TBDP: 8.0000 mg | ORAL_TABLET | Freq: Three times a day (TID) | ORAL | 0 refills | Status: DC | PRN

## 2017-12-26 NOTE — Discharge Instructions (Signed)
Zofran for nausea and vomiting as needed. Keep hydrated, you urine should be clear to pale yellow in color. Bland diet as attached, advance as tolerated. Probiotics after diarrhea resolves. Monitor for any worsening of symptoms, nausea or vomiting not controlled by medication, worsening abdominal pain, fever, follow-up for reevaluation. ° °

## 2017-12-26 NOTE — ED Triage Notes (Signed)
Pt states he was seen here a few days ago for diarrhea, vomiting, nausea. Pt states the diarrhea is not as bad, pt states he isn't able to keep much of anything down. Pt drinking ginger ale in waiting room.

## 2017-12-26 NOTE — ED Provider Notes (Signed)
[facility]    CSN: [id number] Arrival date & time: 12/26/17  1812     History   Chief Complaint Chief Complaint  Patient presents with  . Nausea    HPI Mitchell Quinn is a 26 y.o. male.   26 year old male comes in for nausea and vomiting.  Patient was seen here 2 days ago for diarrhea and nausea.  He states that diarrhea has since improved, but he has felt tired with continued nausea.  He states that he went to work today, and vomited once.  He states that he has not been able to keep much food down, but then denies vomiting prior to today's event.  He states "I just feel like my stomach is inflamed".  States that he has not had an appetite. States given its the holidays, he has not had the best diet, and has not attempted to replenish fluids.  Denies fever, chills, night sweats.  Denies abdominal pain.  He states that he has hemorrhoids, and during these diarrhea episodes, has had some blood with wiping, otherwise no blood in stool.  He had a normal bowel movement today.  He states that he had taken Zofran as needed throughout the past 2 days, but did not take one today prior to work today.       Past Medical History:  Diagnosis Date  . IBS (irritable bowel syndrome)     Patient Active Problem List   Diagnosis Date Noted  . IRRITABLE BOWEL SYNDROME 10/23/2010  . FOLLICULITIS 10/23/2010  . ABDOMINAL PAIN 10/03/2010  . DIARRHEA 09/20/2010  . Sprain of ankle, unspecified site 03/26/2010    Past Surgical History:  Procedure Laterality Date  . ORIF WRIST FRACTURE         Home Medications    Prior to Admission medications   Medication Sig Start Date End Date Taking? Authorizing Provider  acetaminophen (TYLENOL) 500 MG tablet Take 1,000 mg by mouth every 6 (six) hours as needed for headache.    [provider]  loperamide (IMODIUM) 2 MG capsule Take 1 capsule (2 mg total) by mouth 4 (four) times daily as needed for diarrhea or loose stools.  12/24/17   Elvina SidleLauenstein, Kurt, MD  ondansetron (ZOFRAN-ODT) 8 MG disintegrating tablet Take 1 tablet (8 mg total) by mouth every 8 (eight) hours as needed for nausea. 12/26/17   Belinda FisherYu, Galen Russman V, PA-C    Family History Family History  Problem Relation Age of Onset  . Healthy Mother   . Healthy Father     Social History Social History   Tobacco Use  . Smoking status: Never Smoker  Substance Use Topics  . Alcohol use: No  . Drug use: No     Allergies   Patient has no known allergies.   Review of Systems Review of Systems  Reason unable to perform ROS: See HPI as above.     Physical Exam Triage Vital Signs ED Triage Vitals [12/26/17 1839]  Enc Vitals Group     BP (!) 141/78     Pulse Rate 77     Resp 16     Temp 98.3 F (36.8 C)     Temp src      SpO2 97 %     Weight      Height      Head Circumference      Peak Flow      Pain Score      Pain Loc      Pain  Edu?      Excl. in GC?    No data found.  Updated Vital Signs BP (!) 141/78   Pulse 77   Temp 98.3 F (36.8 C)   Resp 16   SpO2 97%   Physical Exam  Constitutional: He is oriented to person, place, and time. He appears well-developed and well-nourished. No distress.  HENT:  Head: Normocephalic and atraumatic.  Cardiovascular: Normal rate, regular rhythm and normal heart sounds. Exam reveals no gallop and no friction rub.  No murmur heard. Pulmonary/Chest: Effort normal and breath sounds normal. He has no wheezes. He has no rales.  Abdominal: Soft. Bowel sounds are normal. He exhibits no mass. There is no tenderness. There is no rebound and no guarding.  Neurological: He is alert and oriented to person, place, and time.  Skin: Skin is warm and dry.  Psychiatric: He has a normal mood and affect. His behavior is normal. Judgment normal.     UC Treatments / Results  Labs (all labs ordered are listed, but only abnormal results are displayed) Labs Reviewed - No data to display  EKG  EKG  Interpretation None       Radiology No results found.  Procedures Procedures (including critical care time)  Medications Ordered in UC Medications - No data to display   Initial Impression / Assessment and Plan / UC Course  I have reviewed the triage vital signs and the nursing notes.  Pertinent labs & imaging results that were available during my care of the patient were reviewed by me and considered in my medical decision making (see chart for details).    Discussed with patient on alarming signs on exam. Zofran for nausea. Push fluids. Bland diet, advance as tolerated. Return precautions given.  Final Clinical Impressions(s) / UC Diagnoses   Final diagnoses:  Nausea    ED Discharge Orders        Ordered    ondansetron (ZOFRAN-ODT) 8 MG disintegrating tablet  Every 8 hours PRN     12/26/17 1909        Belinda FisherYu, Abraham Entwistle V, PA-C 12/26/17 1914

## 2018-01-14 ENCOUNTER — Ambulatory Visit (INDEPENDENT_AMBULATORY_CARE_PROVIDER_SITE_OTHER): Payer: Managed Care, Other (non HMO)

## 2018-01-14 ENCOUNTER — Ambulatory Visit (HOSPITAL_COMMUNITY)
Admission: EM | Admit: 2018-01-14 | Discharge: 2018-01-14 | Disposition: A | Discharge status: Home / Self Care | Payer: Managed Care, Other (non HMO) | Attending: Family Medicine | Admitting: Family Medicine

## 2018-01-14 ENCOUNTER — Other Ambulatory Visit: Payer: Self-pay

## 2018-01-14 ENCOUNTER — Encounter (HOSPITAL_COMMUNITY): Payer: Self-pay | Admitting: Emergency Medicine

## 2018-01-14 DIAGNOSIS — M25572 Pain in left ankle and joints of left foot: Secondary | ICD-10-CM | POA: Diagnosis not present

## 2018-01-14 DIAGNOSIS — S93402A Sprain of unspecified ligament of left ankle, initial encounter: Secondary | ICD-10-CM

## 2018-01-14 NOTE — ED Triage Notes (Addendum)
Pt reports rolling his left ankle when stepping down last week.  He has been wearing an ASO that is helping with his pain but he is not feeling any better. He says he feels a lot of popping and grinding when he tries to walk on it after resting it for a while.

## 2018-01-14 NOTE — ED Provider Notes (Signed)
[facility]    CSN: [id number] Arrival date & time: 01/14/18  1515     History   Chief Complaint Chief Complaint  Patient presents with  . Ankle Injury    left    HPI Mitchell Quinn is a 27 y.o. male presenting 6 days after injury with left ankle pain. Mitchell Quinn stepped down off a ledge and rolled his left ankle. Has had persistent swelling and ankle pain since. Able to bear weight. Works at [employer] on feet/using equipment that aggravates foot. Wearing ankle brace.   HPI  Past Medical History:  Diagnosis Date  . IBS (irritable bowel syndrome)     Patient Active Problem List   Diagnosis Date Noted  . IRRITABLE BOWEL SYNDROME 10/23/2010  . FOLLICULITIS 10/23/2010  . ABDOMINAL PAIN 10/03/2010  . DIARRHEA 09/20/2010  . Sprain of ankle, unspecified site 03/26/2010    Past Surgical History:  Procedure Laterality Date  . ORIF WRIST FRACTURE         Home Medications    Prior to Admission medications   Medication Sig Start Date End Date Taking? Authorizing Provider  atomoxetine (STRATTERA) 40 MG capsule Take 40 mg by mouth daily.   Yes [provider]  loperamide (IMODIUM) 2 MG capsule Take 1 capsule (2 mg total) by mouth 4 (four) times daily as needed for diarrhea or loose stools. 12/24/17  Yes Elvina SidleLauenstein, Kurt, MD  ondansetron (ZOFRAN-ODT) 8 MG disintegrating tablet Take 1 tablet (8 mg total) by mouth every 8 (eight) hours as needed for nausea. 12/26/17  Yes Yu, Amy V, PA-C  acetaminophen (TYLENOL) 500 MG tablet Take 1,000 mg by mouth every 6 (six) hours as needed for headache.    [provider]    Family History Family History  Problem Relation Age of Onset  . Healthy Mother   . Healthy Father     Social History Social History   Tobacco Use  . Smoking status: Never Smoker  . Smokeless tobacco: Never Used  Substance Use Topics  . Alcohol use: No  . Drug use: No     Allergies   Patient has no known  allergies.   Review of Systems Review of Systems  Gastrointestinal: Negative for nausea and vomiting.  Musculoskeletal: Positive for arthralgias. Negative for gait problem.  Skin: Positive for color change. Negative for wound.  Neurological: Negative for headaches.     Physical Exam Triage Vital Signs ED Triage Vitals  Enc Vitals Group     BP 01/14/18 1526 116/75     Pulse Rate 01/14/18 1526 84     Resp --      Temp 01/14/18 1526 98.3 F (36.8 C)     Temp Source 01/14/18 1526 Oral     SpO2 01/14/18 1526 97 %     Weight --      Height --      Head Circumference --      Peak Flow --      Pain Score 01/14/18 1522 6     Pain Loc --      Pain Edu? --      Excl. in GC? --    No data found.  Updated Vital Signs BP 116/75 (BP Location: Left Arm)   Pulse 84   Temp 98.3 F (36.8 C) (Oral)   SpO2 97%   Visual Acuity Right Eye Distance:   Left Eye Distance:   Bilateral Distance:    Right Eye Near:   Left Eye Near:  Bilateral Near:     Physical Exam  Constitutional: Mitchell Quinn appears well-developed and well-nourished. No distress.  HENT:  Head: Normocephalic and atraumatic.  Eyes: Conjunctivae are normal.  Neck: Neck supple.  Cardiovascular: Regular rhythm.  Pulmonary/Chest: Effort normal. No respiratory distress.  Musculoskeletal: Mitchell Quinn exhibits edema.  Left ankle with significant swelling to lateral malleolus, yellow and purple bruising to ankle and foot, mild swelling and bruising to medial malleolus. ROM limited due to swelling/pain  Neurological: Mitchell Quinn is alert.  Skin: Skin is warm and dry.  Psychiatric: Mitchell Quinn has a normal mood and affect.  Nursing note and vitals reviewed.    UC Treatments / Results  Labs (all labs ordered are listed, but only abnormal results are displayed) Labs Reviewed - No data to display  EKG  EKG Interpretation None       Radiology Dg Ankle Complete Left  Result Date: 01/14/2018 CLINICAL DATA:  Stepped in hole six days ago. Swelling  and bruising. EXAM: LEFT ANKLE COMPLETE - 3+ VIEW COMPARISON:  None. FINDINGS: Lateral soft tissue swelling. Joint effusion. No evidence of fracture or dislocation. IMPRESSION: Lateral soft tissue swelling and joint effusion. Electronically Signed   By: Paulina Fusi M.D.   On: 01/14/2018 15:44    Procedures Procedures (including critical care time)  Medications Ordered in UC Medications - No data to display   Initial Impression / Assessment and Plan / UC Course  I have reviewed the triage vital signs and the nursing notes.  Pertinent labs & imaging results that were available during my care of the patient were reviewed by me and considered in my medical decision making (see chart for details).     No fractures on xray, likely ankle sprain. Will treat with RICE/Anti-inflammatories. Continue using brace that you have. Discussed strict return precautions. Patient verbalized understanding and is agreeable with plan. May need to follow up with ortho if symptoms persist without imiprovement  Final Clinical Impressions(s) / UC Diagnoses   Final diagnoses:  Sprain of left ankle, unspecified ligament, initial encounter    ED Discharge Orders    None       Controlled Substance Prescriptions [place] Controlled Substance Registry consulted? Not Applicable   Lew Dawes, [place] 01/14/18 1605

## 2018-01-14 NOTE — Discharge Instructions (Signed)
No fracture seen on Xray.  Please continue to Rest, Ice, Compression and Elevation.  Wear ankle brace for support especially at work.   Follow up with orthopedics for continued pain.

## 2018-01-14 NOTE — ED Notes (Signed)
Patient discharged by provider.

## 2018-02-14 ENCOUNTER — Encounter (HOSPITAL_COMMUNITY): Payer: Self-pay | Admitting: Emergency Medicine

## 2018-02-14 ENCOUNTER — Ambulatory Visit (HOSPITAL_COMMUNITY)
Admission: EM | Admit: 2018-02-14 | Discharge: 2018-02-14 | Disposition: A | Discharge status: Home / Self Care | Payer: Managed Care, Other (non HMO) | Attending: Family Medicine | Admitting: Family Medicine

## 2018-02-14 DIAGNOSIS — K6289 Other specified diseases of anus and rectum: Secondary | ICD-10-CM | POA: Diagnosis not present

## 2018-02-14 DIAGNOSIS — Z8719 Personal history of other diseases of the digestive system: Secondary | ICD-10-CM

## 2018-02-14 MED ORDER — HYDROCORTISONE 2.5 % RE CREA: 1.0000 "application " | TOPICAL_CREAM | Freq: Two times a day (BID) | RECTAL | 0 refills | Status: DC

## 2018-02-14 NOTE — ED Triage Notes (Signed)
PT C/O: external hemorrhoids onset x5 months   Reports he has had 2-3 band procedures in the past.   A&O x4... NAD... Ambulatory

## 2018-02-19 NOTE — ED Provider Notes (Signed)
  [facility]   [id number] 02/14/18 Arrival Time: 1729  ASSESSMENT & PLAN:  1. Anal irritation   2. Hx of hemorrhoids     Meds ordered this encounter  Medications  . hydrocortisone (ANUSOL-HC) 2.5 % rectal cream    Sig: Place 1 application rectally 2 (two) times daily.    Dispense:  30 g    Refill:  0   Will schedule f/u with his general surgeon. May f/u here as needed.  Reviewed expectations re: course of current medical issues. Questions answered. Outlined signs and symptoms indicating need for more acute intervention. Patient verbalized understanding. After Visit Summary given.   SUBJECTIVE: History from: patient.  Mitchell Quinn is a 27 y.o. male who presents with complaint of intermittent rectal discomfort for the past few months. H/O hemorrhoids with 2-3 banding procedures in the past. Currently with mild to moderate discomfort. Occasional bright red blood after BM. Pain at rectum is sharp and worsens with BM. No abdominal pain/n/v. Ambulatory without problem. OTC cream without relief. Prolonged standing exacerbates symptoms.   Past Surgical History:  Procedure Laterality Date  . ORIF WRIST FRACTURE      ROS: As per HPI.  OBJECTIVE:  Vitals:   02/14/18 1741  Pulse: 79  Resp: 20  Temp: 98.4 F (36.9 C)  TempSrc: Oral  SpO2: 100%    General appearance: alert; no distress Lungs: clear to auscultation bilaterally Heart: regular rate and rhythm Abdomen: soft; non-distended; no significant abdominal tenderness; bowel sounds present; no masses or organomegaly; no guarding or rebound tenderness; rectal exam with mild tenderness and small non-thrombosed ext hemorrhoid Back: no CVA tenderness Extremities: no edema; symmetrical with no gross deformities Skin: warm and dry Psychological: alert and cooperative; normal mood and affect   No Known Allergies                                             Past Medical History:  Diagnosis Date  . IBS  (irritable bowel syndrome)    Social History   Socioeconomic History  . Marital status: Married    Spouse name: Not on file  . Number of children: Not on file  . Years of education: Not on file  . Highest education level: Not on file  Social Needs  . Financial resource strain: Not on file  . Food insecurity - worry: Not on file  . Food insecurity - inability: Not on file  . Transportation needs - medical: Not on file  . Transportation needs - non-medical: Not on file  Occupational History  . Not on file  Tobacco Use  . Smoking status: Never Smoker  . Smokeless tobacco: Never Used  Substance and Sexual Activity  . Alcohol use: No  . Drug use: No  . Sexual activity: Not on file  Other Topics Concern  . Not on file  Social History Narrative  . Not on file   Family History  Problem Relation Age of Onset  . Healthy Mother   . Healthy Father      Mardella LaymanHagler, Eshaal Duby, MD 02/19/18 [phone]

## 2018-04-08 ENCOUNTER — Other Ambulatory Visit: Payer: Self-pay

## 2018-04-08 ENCOUNTER — Ambulatory Visit (HOSPITAL_COMMUNITY)
Admission: EM | Admit: 2018-04-08 | Discharge: 2018-04-08 | Disposition: A | Discharge status: Home / Self Care | Payer: Managed Care, Other (non HMO) | Attending: Family Medicine | Admitting: Family Medicine

## 2018-04-08 ENCOUNTER — Encounter (HOSPITAL_COMMUNITY): Payer: Self-pay | Admitting: Emergency Medicine

## 2018-04-08 DIAGNOSIS — R112 Nausea with vomiting, unspecified: Secondary | ICD-10-CM | POA: Diagnosis not present

## 2018-04-08 MED ORDER — ONDANSETRON 4 MG PO TBDP: 4.0000 mg | ORAL_TABLET | Freq: Three times a day (TID) | ORAL | 0 refills | Status: DC | PRN

## 2018-04-08 NOTE — ED Triage Notes (Signed)
Vomiting for 2 days.  Vomiting stopped around noon today.  Patient feels tired, dizzy.

## 2018-04-11 NOTE — ED Provider Notes (Signed)
[facility]   [id number] 04/08/18 Arrival Time: 1750  ASSESSMENT & PLAN:  1. Non-intractable vomiting with nausea, unspecified vomiting type    Meds ordered this encounter  Medications  . ondansetron (ZOFRAN-ODT) 4 MG disintegrating tablet    Sig: Take 1 tablet (4 mg total) by mouth every 8 (eight) hours as needed for nausea or vomiting.    Dispense:  15 tablet    Refill:  0   Discussed typical duration of symptoms for suspected viral GI illness. Will do his best to ensure adequate fluid intake in order to avoid dehydration. Will proceed to the Emergency Department for evaluation if unable to tolerate PO fluids regularly.  Otherwise he will f/u with his PCP or here if not showing improvement over the next 48-72 hours.  Reviewed expectations re: course of current medical issues. Questions answered. Outlined signs and symptoms indicating need for more acute intervention. Patient verbalized understanding. After Visit Summary given.   SUBJECTIVE: History from: patient.  Mitchell Quinn is a 27 y.o. male who presents with complaint of non-bloody intermittent nausea and vomiting of brown material without diarrhea. Onset abrupt, 2 days ago. Abdominal discomfort: mild and cramping. Symptoms are gradually improving since beginning. Aggravating factors: eating. Alleviating factors: none. Associated symptoms: fatigue. He denies fever. Appetite: decreased. PO intake: decreased. Ambulatory without assistance. Urinary symptoms: none. Last bowel movement yesterday without blood. OTC treatment: none.   Past Surgical History:  Procedure Laterality Date  . ORIF WRIST FRACTURE      ROS: As per HPI.  OBJECTIVE:  Vitals:   04/08/18 1818  BP: 119/70  Pulse: 74  Resp: 18  Temp: 98.3 F (36.8 C)  TempSrc: Oral  SpO2: 98%    General appearance: alert; no distress Oropharynx: moist Lungs: clear to auscultation bilaterally Heart: regular rate and rhythm Abdomen: soft;  non-distended; no significant abdominal tenderness, "just a cramping feeling"; bowel sounds present; no masses or organomegaly; no guarding or rebound tenderness Back: no CVA tenderness Extremities: no edema; symmetrical with no gross deformities Skin: warm and dry Neurologic: normal gait Psychological: alert and cooperative; normal mood and affect   No Known Allergies                                             Past Medical History:  Diagnosis Date  . IBS (irritable bowel syndrome)    Social History   Socioeconomic History  . Marital status: Married    Spouse name: Not on file  . Number of children: Not on file  . Years of education: Not on file  . Highest education level: Not on file  Occupational History  . Not on file  Social Needs  . Financial resource strain: Not on file  . Food insecurity:    Worry: Not on file    Inability: Not on file  . Transportation needs:    Medical: Not on file    Non-medical: Not on file  Tobacco Use  . Smoking status: Never Smoker  . Smokeless tobacco: Never Used  Substance and Sexual Activity  . Alcohol use: No  . Drug use: No  . Sexual activity: Not on file  Lifestyle  . Physical activity:    Days per week: Not on file    Minutes per session: Not on file  . Stress: Not on file  Relationships  . Social connections:  Talks on phone: Not on file    Gets together: Not on file    Attends religious service: Not on file    Active member of club or organization: Not on file    Attends meetings of clubs or organizations: Not on file    Relationship status: Not on file  . Intimate partner violence:    Fear of current or ex partner: Not on file    Emotionally abused: Not on file    Physically abused: Not on file    Forced sexual activity: Not on file  Other Topics Concern  . Not on file  Social History Narrative  . Not on file   Family History  Problem Relation Age of Onset  . Healthy Mother   . Healthy Father      Mardella Layman, MD 04/11/18 [phone]

## 2018-04-15 ENCOUNTER — Ambulatory Visit (HOSPITAL_COMMUNITY)
Admission: EM | Admit: 2018-04-15 | Discharge: 2018-04-15 | Disposition: A | Discharge status: Home / Self Care | Payer: Managed Care, Other (non HMO) | Attending: Urgent Care | Admitting: Urgent Care

## 2018-04-15 ENCOUNTER — Encounter (HOSPITAL_COMMUNITY): Payer: Self-pay | Admitting: Family Medicine

## 2018-04-15 DIAGNOSIS — K649 Unspecified hemorrhoids: Secondary | ICD-10-CM

## 2018-04-15 DIAGNOSIS — K529 Noninfective gastroenteritis and colitis, unspecified: Secondary | ICD-10-CM

## 2018-04-15 DIAGNOSIS — K6289 Other specified diseases of anus and rectum: Secondary | ICD-10-CM

## 2018-04-15 DIAGNOSIS — R197 Diarrhea, unspecified: Secondary | ICD-10-CM

## 2018-04-15 MED ORDER — HYDROCORTISONE ACETATE 25 MG RE SUPP
25.0000 mg | Freq: Two times a day (BID) | RECTAL | 0 refills | Status: DC
Start: 2018-04-15 — End: 2019-07-12

## 2018-04-15 NOTE — Discharge Instructions (Addendum)
[facility] is a general surgery practice that can help.

## 2018-04-15 NOTE — ED Triage Notes (Signed)
Pt here for chronic hemrhoids. sts some pain, irritation and bleeding.

## 2018-04-15 NOTE — ED Provider Notes (Signed)
  MRN: [id number] DOB: 02/20/1991  Subjective:   Mitchell Quinn is a 27 y.o. male presenting for several day history of perianal pain, irritation, bleeding.  Just prior to that patient had gastroenteritis with nausea, vomiting, diarrhea for 3 days.  Admits that he was having about 10 loose watery stools per day without blood.  His symptoms have since improved but now he is having this perianal pain.  He also admits a long-standing history of internal hemorrhoids.  He has done banding before and needed additional banding as instructed by his general surgeon.  He was told that he has a total of 11 internal hemorrhoids.  He just moved here and does not have a Development worker, international aidgeneral surgeon yet.  Denies feeling any masses over the anal area.  No current facility-administered medications for this encounter.   Current Outpatient Medications:  .  acetaminophen (TYLENOL) 500 MG tablet, Take 1,000 mg by mouth every 6 (six) hours as needed for headache., Disp: , Rfl:  .  atomoxetine (STRATTERA) 40 MG capsule, Take 40 mg by mouth daily., Disp: , Rfl:  .  hydrocortisone (ANUSOL-HC) 2.5 % rectal cream, Place 1 application rectally 2 (two) times daily., Disp: 30 g, Rfl: 0   No Known Allergies  Past Medical History:  Diagnosis Date  . IBS (irritable bowel syndrome)      Past Surgical History:  Procedure Laterality Date  . ORIF WRIST FRACTURE      Objective:   Vitals: BP 103/64   Pulse 89   Temp 98.5 F (36.9 C)   Resp 18   SpO2 98%   Physical Exam  Constitutional: He is oriented to person, place, and time. He appears well-developed and well-nourished.  Cardiovascular: Normal rate.  Pulmonary/Chest: Effort normal.  Genitourinary:     Neurological: He is alert and oriented to person, place, and time.    Assessment and Plan :   Hemorrhoids, unspecified hemorrhoid type  Perianal pain  Diarrhea, unspecified type  Gastroenteritis  Recommended patient contact [facility] for consult  on his internal hemorrhoids.  Also counseled that patient may be having irritation from his GI illness and frequent bouts of diarrhea.  We will manage supportively and monitor.  In the meantime patient will try hydrocortisone suppositories for his internal hemorrhoids. Return-to-clinic precautions discussed, patient verbalized understanding.      Wallis BambergMani, Yamin Swingler, [place] 04/16/18 1222

## 2018-07-09 ENCOUNTER — Encounter (HOSPITAL_COMMUNITY): Payer: Self-pay | Admitting: Emergency Medicine

## 2018-07-09 ENCOUNTER — Other Ambulatory Visit: Payer: Self-pay

## 2018-07-09 ENCOUNTER — Ambulatory Visit (HOSPITAL_COMMUNITY)
Admission: EM | Admit: 2018-07-09 | Discharge: 2018-07-09 | Disposition: A | Discharge status: Home / Self Care | Payer: Managed Care, Other (non HMO) | Attending: Family Medicine | Admitting: Family Medicine

## 2018-07-09 DIAGNOSIS — K529 Noninfective gastroenteritis and colitis, unspecified: Secondary | ICD-10-CM

## 2018-07-09 MED ORDER — ONDANSETRON 4 MG PO TBDP: 4.0000 mg | ORAL_TABLET | Freq: Three times a day (TID) | ORAL | 0 refills | Status: DC | PRN

## 2018-07-09 NOTE — ED Triage Notes (Signed)
Nausea, vomiting and diarrhea that started 3 hours ago.

## 2018-07-09 NOTE — Discharge Instructions (Signed)

## 2018-07-22 NOTE — ED Provider Notes (Signed)
[facility]   [id number] 07/09/18 Arrival Time: 1950  ASSESSMENT & PLAN:  1. Gastroenteritis     Meds ordered this encounter  Medications  . ondansetron (ZOFRAN-ODT) 4 MG disintegrating tablet    Sig: Take 1 tablet (4 mg total) by mouth every 8 (eight) hours as needed for nausea or vomiting.    Dispense:  15 tablet    Refill:  0   Discussed typical duration of symptoms for suspected viral GI illness. Will do his best to ensure adequate fluid intake in order to avoid dehydration. Will proceed to the Emergency Department for evaluation if unable to tolerate PO fluids regularly.  Otherwise he will f/u with his PCP or here if not showing improvement over the next 48-72 hours.  Reviewed expectations re: course of current medical issues. Questions answered. Outlined signs and symptoms indicating need for more acute intervention. Patient verbalized understanding. After Visit Summary given.   SUBJECTIVE: History from: patient.  Mitchell Quinn is a 27 y.o. male who presents with complaint of non-bloody intermittent nausea and vomiting of undigested food with diarrhea. Onset abrupt, several hours ago. Abdominal discomfort: mild and cramping. Symptoms are unchanged since beginning. Aggravating factors: eating. Alleviating factors: none. Associated symptoms: fatigue. He denies fever. Appetite: decreased. PO intake: decreased. Ambulatory without assistance. Urinary symptoms: none. Last bowel movement this morning without blood. OTC treatment: none.    Past Surgical History:  Procedure Laterality Date  . ORIF WRIST FRACTURE      ROS: As per HPI.  OBJECTIVE:  Vitals:   07/09/18 2014  BP: 119/78  Pulse: 71  Resp: (!) 22  Temp: 98.2 F (36.8 C)  TempSrc: Oral  SpO2: 97%    General appearance: alert; no distress Oropharynx: moist Lungs: clear to auscultation bilaterally Heart: regular rate and rhythm Abdomen: soft; non-distended; no significant abdominal  tenderness, "cramping feeling"; bowel sounds present; no masses or organomegaly; no guarding or rebound tenderness Back: no CVA tenderness Extremities: no edema; symmetrical with no gross deformities Skin: warm and dry Neurologic: normal gait Psychological: alert and cooperative; normal mood and affect   No Known Allergies                                             Past Medical History:  Diagnosis Date  . IBS (irritable bowel syndrome)    Social History   Socioeconomic History  . Marital status: Married    Spouse name: Not on file  . Number of children: Not on file  . Years of education: Not on file  . Highest education level: Not on file  Occupational History  . Not on file  Social Needs  . Financial resource strain: Not on file  . Food insecurity:    Worry: Not on file    Inability: Not on file  . Transportation needs:    Medical: Not on file    Non-medical: Not on file  Tobacco Use  . Smoking status: Never Smoker  . Smokeless tobacco: Never Used  Substance and Sexual Activity  . Alcohol use: No  . Drug use: No  . Sexual activity: Not on file  Lifestyle  . Physical activity:    Days per week: Not on file    Minutes per session: Not on file  . Stress: Not on file  Relationships  . Social connections:    Talks on phone:  Not on file    Gets together: Not on file    Attends religious service: Not on file    Active member of club or organization: Not on file    Attends meetings of clubs or organizations: Not on file    Relationship status: Not on file  . Intimate partner violence:    Fear of current or ex partner: Not on file    Emotionally abused: Not on file    Physically abused: Not on file    Forced sexual activity: Not on file  Other Topics Concern  . Not on file  Social History Narrative  . Not on file   Family History  Problem Relation Age of Onset  . Healthy Mother   . Healthy Father      Mardella LaymanHagler, Rekita Miotke, MD 07/22/18 1026

## 2018-08-31 ENCOUNTER — Emergency Department (HOSPITAL_COMMUNITY)
Admission: EM | Admit: 2018-08-31 | Discharge: 2018-08-31 | Disposition: A | Discharge status: Home / Self Care | Payer: Managed Care, Other (non HMO) | Attending: Emergency Medicine | Admitting: Emergency Medicine

## 2018-08-31 ENCOUNTER — Other Ambulatory Visit: Payer: Self-pay

## 2018-08-31 ENCOUNTER — Emergency Department (HOSPITAL_COMMUNITY): Payer: Managed Care, Other (non HMO)

## 2018-08-31 DIAGNOSIS — Z79899 Other long term (current) drug therapy: Secondary | ICD-10-CM | POA: Diagnosis not present

## 2018-08-31 DIAGNOSIS — J181 Lobar pneumonia, unspecified organism: Secondary | ICD-10-CM

## 2018-08-31 DIAGNOSIS — J189 Pneumonia, unspecified organism: Secondary | ICD-10-CM | POA: Insufficient documentation

## 2018-08-31 DIAGNOSIS — R509 Fever, unspecified: Secondary | ICD-10-CM | POA: Diagnosis present

## 2018-08-31 MED ORDER — ALBUTEROL SULFATE HFA 108 (90 BASE) MCG/ACT IN AERS: 2.0000 | INHALATION_SPRAY | RESPIRATORY_TRACT | Status: DC

## 2018-08-31 MED ORDER — AZITHROMYCIN 250 MG PO TABS: ORAL_TABLET | ORAL | 0 refills | Status: DC

## 2018-08-31 MED ORDER — BENZONATATE 100 MG PO CAPS: 100.0000 mg | ORAL_CAPSULE | Freq: Three times a day (TID) | ORAL | 0 refills | Status: DC

## 2018-08-31 MED ORDER — IPRATROPIUM-ALBUTEROL 0.5-2.5 (3) MG/3ML IN SOLN
3.0000 mL | Freq: Once | RESPIRATORY_TRACT | Status: AC
  Administered 2018-08-31: 3 mL via RESPIRATORY_TRACT
  Filled 2018-08-31: qty 3

## 2018-08-31 NOTE — Discharge Instructions (Addendum)
Be sure you are drinking plenty of fluids and follow up with your doctor so he can schedule a follow up x-ray to be sure the area of pneumonia clears. Return here for worsening symptoms.

## 2018-08-31 NOTE — ED Notes (Signed)
Resp. notified

## 2018-08-31 NOTE — ED Provider Notes (Signed)
[facility] EMERGENCY DEPARTMENT Provider Note   CSN: [id number] Arrival date & time: 08/31/18  1529     History   Chief Complaint Chief Complaint  Patient presents with  . Fever  . Cough    HPI Mitchell STIFF is a 27 y.o. male who presents to the ED with c/o cough, congestion, fever and chills. Symptoms started initially about a week ago and were mild. Fever started last night and symptoms have gotten worse. Patient taking tylenol.   HPI  Past Medical History:  Diagnosis Date  . IBS (irritable bowel syndrome)     Patient Active Problem List   Diagnosis Date Noted  . IRRITABLE BOWEL SYNDROME 10/23/2010  . FOLLICULITIS 10/23/2010  . ABDOMINAL PAIN 10/03/2010  . DIARRHEA 09/20/2010  . Sprain of ankle, unspecified site 03/26/2010    Past Surgical History:  Procedure Laterality Date  . ORIF WRIST FRACTURE          Home Medications    Prior to Admission medications   Medication Sig Start Date End Date Taking? Authorizing Provider  acetaminophen (TYLENOL) 500 MG tablet Take 1,000 mg by mouth every 6 (six) hours as needed for headache.    [provider]  atomoxetine (STRATTERA) 40 MG capsule Take 40 mg by mouth daily.    [provider]  azithromycin (ZITHROMAX Z-PAK) 250 MG tablet Take 2 tablets PO now and then one tablet daily until finished 08/31/18   Damian Leavell, [place], NP  benzonatate (TESSALON) 100 MG capsule Take 1 capsule (100 mg total) by mouth every 8 (eight) hours. 08/31/18   Janne Napoleon, NP  hydrocortisone (ANUSOL-HC) 25 MG suppository Place 1 suppository (25 mg total) rectally 2 (two) times daily. 04/15/18   Wallis Bamberg, PA-C  ondansetron (ZOFRAN-ODT) 4 MG disintegrating tablet Take 1 tablet (4 mg total) by mouth every 8 (eight) hours as needed for nausea or vomiting. 07/09/18   Mardella Layman, MD    Family History Family History  Problem Relation Age of Onset  . Healthy Mother   . Healthy Father     Social History Social History    Tobacco Use  . Smoking status: Never Smoker  . Smokeless tobacco: Never Used  Substance Use Topics  . Alcohol use: No  . Drug use: No     Allergies   Patient has no known allergies.   Review of Systems Review of Systems  Constitutional: Positive for chills and fever.  HENT: Positive for congestion.   Respiratory: Positive for cough.   All other systems reviewed and are negative.    Physical Exam Updated Vital Signs BP 119/68 (BP Location: Right Arm)   Pulse (!) 104   Temp 99.7 F (37.6 C) (Oral)   Resp (!) 24   Ht 5\' 11"  (1.803 m)   Wt 117.5 kg   SpO2 96%   BMI 36.12 kg/m   Physical Exam  Constitutional: He is oriented to person, place, and time. He appears well-developed and well-nourished. No distress.  HENT:  Head: Normocephalic.  Right Ear: Tympanic membrane normal.  Left Ear: Tympanic membrane normal.  Mouth/Throat: Oropharynx is clear and moist.  Eyes: Conjunctivae and EOM are normal.  Neck: Neck supple.  Cardiovascular: Normal rate.  Pulmonary/Chest: Effort normal. He has decreased breath sounds in the right upper field. He has rales in the right upper field.  Abdominal: Soft. There is no tenderness.  Musculoskeletal: Normal range of motion.  Neurological: He is alert and oriented to person, place, and time.  No cranial nerve deficit.  Skin: Skin is warm and dry.  Psychiatric: He has a normal mood and affect.  Nursing note and vitals reviewed.    ED Treatments / Results  Labs (all labs ordered are listed, but only abnormal results are displayed) Labs Reviewed - No data to display  Radiology Dg Chest 2 View  Result Date: 08/31/2018 CLINICAL DATA:  Fever with cough EXAM: CHEST - 2 VIEW COMPARISON:  04/13/2018 FINDINGS: Ill-defined slightly nodular opacity in the right upper lobe. No pleural effusion. Normal heart size. No pneumothorax. IMPRESSION: Ill-defined right upper lobe slightly nodular opacity, may reflect a pneumonia, possibly atypical.  Consider short interval radiographic follow-up. Electronically Signed   By: Jasmine Pang M.D.   On: 08/31/2018 16:48    Procedures Procedures (including critical care time)  Medications Ordered in ED Medications  ipratropium-albuterol (DUONEB) 0.5-2.5 (3) MG/3ML nebulizer solution 3 mL (3 mLs Nebulization Given 08/31/18 1807)     Initial Impression / Assessment and Plan / ED Course  I have reviewed the triage vital signs and the nursing notes. 27 y.o. male here with cough, fever, fatigue x 1 week stable for d/c with 02 SAT 96% on R/A. Patient symptoms improved with Duoneb. Stable for d/c with x-ray c/w RUL pneumonia. Will treat with antibiotics, Albuterol inhaler, cough medication. Encouraged patient to f/u with PCP or return here for worsening symptoms. Patient agrees with plan.  Final Clinical Impressions(s) / ED Diagnoses   Final diagnoses:  Community acquired pneumonia of right upper lobe of lung [facility])    ED Discharge Orders         Ordered    azithromycin (ZITHROMAX Z-PAK) 250 MG tablet     08/31/18 1823    benzonatate (TESSALON) 100 MG capsule  Every 8 hours     08/31/18 1823           Kerrie [place], NP 09/01/18 0240    Mancel Bale, MD 09/02/18 [phone]

## 2018-08-31 NOTE — ED Triage Notes (Signed)
Pt reports fever, cough, and congestion.  Symptoms initiated mildly a week ago with fever starting last night.  Tylenol taken this morning.

## 2019-01-22 ENCOUNTER — Other Ambulatory Visit: Payer: Self-pay

## 2019-01-22 ENCOUNTER — Emergency Department (HOSPITAL_COMMUNITY)
Admission: EM | Admit: 2019-01-22 | Discharge: 2019-01-22 | Disposition: A | Discharge status: Home / Self Care | Payer: Managed Care, Other (non HMO) | Attending: Emergency Medicine | Admitting: Emergency Medicine

## 2019-01-22 ENCOUNTER — Emergency Department (HOSPITAL_COMMUNITY): Payer: Managed Care, Other (non HMO)

## 2019-01-22 ENCOUNTER — Encounter (HOSPITAL_COMMUNITY): Payer: Self-pay

## 2019-01-22 DIAGNOSIS — R05 Cough: Secondary | ICD-10-CM | POA: Diagnosis present

## 2019-01-22 DIAGNOSIS — J069 Acute upper respiratory infection, unspecified: Secondary | ICD-10-CM | POA: Diagnosis not present

## 2019-01-22 DIAGNOSIS — B9789 Other viral agents as the cause of diseases classified elsewhere: Secondary | ICD-10-CM

## 2019-01-22 DIAGNOSIS — R059 Cough, unspecified: Secondary | ICD-10-CM

## 2019-01-22 HISTORY — DX: Pneumonia, unspecified organism: J18.9

## 2019-01-22 MED ORDER — IBUPROFEN 800 MG PO TABS
800.0000 mg | ORAL_TABLET | Freq: Once | ORAL | Status: AC
  Administered 2019-01-22: 800 mg via ORAL
  Filled 2019-01-22: qty 1

## 2019-01-22 MED ORDER — BENZONATATE 100 MG PO CAPS: 100.0000 mg | ORAL_CAPSULE | Freq: Three times a day (TID) | ORAL | 0 refills | Status: DC

## 2019-01-22 NOTE — ED Triage Notes (Signed)
PT reports productive cough with yellow sputum, fever, body aches, and sharp r sided chest pain.  Reports history of pneumonia and says symptoms feel similar.

## 2019-01-22 NOTE — ED Notes (Signed)
ED Provider at bedside. 

## 2019-01-22 NOTE — Discharge Instructions (Signed)
You were evaluated in the Emergency Department and after careful evaluation, we did not find any emergent condition requiring admission or further testing in the hospital.  Your symptoms today seem to be due to a viral infection.  Your x-ray today was very reassuring, with no evidence of pneumonia.  We are encouraged to hear that you have quit smoking, this will definitely help you prevent future cases of pneumonia.  Use Tylenol or ibuprofen during the day to help with discomfort, you can use the anticough medication provided as needed.  Please return to the Emergency Department if you experience any worsening of your condition.  We encourage you to follow up with a primary care provider.  Thank you for allowing us to be a part of your care.

## 2019-01-22 NOTE — ED Provider Notes (Signed)
[facility] Emergency Department Provider Note MRN:  [id number]  Arrival date & time: 01/22/19     Chief Complaint   Cough and Fever   History of Present Illness   Mitchell Quinn is a 28 y.o. year-old male with a history of pneumonia presenting to the ED with chief complaint of cough.  Cough productive of yellow sputum, associated with subjective fever, body aches, right-sided chest pain.  Described as a sharp pain.  Symptoms present for 1 day, began yesterday at noon.  Moderate in severity, constant, no exacerbating or relieving factors.  Symptoms feel similar to patient's experience with pneumonia a few months ago.  Review of Systems  A complete 10 system review of systems was obtained and all systems are negative except as noted in the HPI and PMH.   Patient's Health History    Past Medical History:  Diagnosis Date  . IBS (irritable bowel syndrome)   . Pneumonia     Past Surgical History:  Procedure Laterality Date  . ORIF WRIST FRACTURE      Family History  Problem Relation Age of Onset  . Healthy Mother   . Healthy Father     Social History   Socioeconomic History  . Marital status: Married    Spouse name: Not on file  . Number of children: Not on file  . Years of education: Not on file  . Highest education level: Not on file  Occupational History  . Not on file  Social Needs  . Financial resource strain: Not on file  . Food insecurity:    Worry: Not on file    Inability: Not on file  . Transportation needs:    Medical: Not on file    Non-medical: Not on file  Tobacco Use  . Smoking status: Never Smoker  . Smokeless tobacco: Never Used  Substance and Sexual Activity  . Alcohol use: No  . Drug use: No  . Sexual activity: Not on file  Lifestyle  . Physical activity:    Days per week: Not on file    Minutes per session: Not on file  . Stress: Not on file  Relationships  . Social connections:    Talks on phone: Not on file   Gets together: Not on file    Attends religious service: Not on file    Active member of club or organization: Not on file    Attends meetings of clubs or organizations: Not on file    Relationship status: Not on file  . Intimate partner violence:    Fear of current or ex partner: Not on file    Emotionally abused: Not on file    Physically abused: Not on file    Forced sexual activity: Not on file  Other Topics Concern  . Not on file  Social History Narrative  . Not on file     Physical Exam  Vital Signs and Nursing Notes reviewed Vitals:   01/22/19 0800 01/22/19 0820  BP: 105/66   Pulse: 98 (!) 103  Resp: 18 18  Temp:    SpO2: 97% 96%    CONSTITUTIONAL: Well-appearing, NAD NEURO:  Alert and oriented x 3, no focal deficits EYES:  eyes equal and reactive ENT/NECK:  no LAD, no JVD CARDIO: Regular rate, well-perfused, normal S1 and S2 PULM:  CTAB no wheezing or rhonchi GI/GU:  normal bowel sounds, non-distended, non-tender MSK/SPINE:  No gross deformities, no edema SKIN:  no rash, atraumatic PSYCH:  Appropriate  speech and behavior  Diagnostic and Interventional Summary    Labs Reviewed - No data to display  DG Chest 2 View  Final Result      Medications  ibuprofen (ADVIL,MOTRIN) tablet 800 mg (has no administration in time range)     Procedures Critical Care  ED Course and Medical Decision Making  I have reviewed the triage vital signs and the nursing notes.  Pertinent labs & imaging results that were available during my care of the patient were reviewed by me and considered in my medical decision making (see below for details).  Favoring viral illness in this 28 year old male with cough, fever, body aches, sharp chest pain favored to be musculoskeletal in nature, worse with coughing.  No evidence of DVT on exam, good oxygen saturations, no shortness of breath, little to no concern for pulmonary embolism, especially in the setting of viral symptoms.  Chest x-ray  with no evidence of pneumonia.  Provided with reassurance, appropriate for discharge.  After the discussed management above, the patient was determined to be safe for discharge.  The patient was in agreement with this plan and all questions regarding their care were answered.  ED return precautions were discussed and the patient will return to the ED with any significant worsening of condition.  Elmer Sow. Pilar Plate, MD [facility] Emergency Medicine [facility] [email or web address]  Final Clinical Impressions(s) / ED Diagnoses     ICD-10-CM   1. Viral URI with cough J06.9    B97.89   2. Cough R05 DG Chest 2 View    DG Chest 2 View    ED Discharge Orders         Ordered    benzonatate (TESSALON) 100 MG capsule  Every 8 hours     01/22/19 0844             Sabas Sous, MD 01/22/19 [phone]

## 2019-01-25 ENCOUNTER — Ambulatory Visit (HOSPITAL_COMMUNITY)
Admission: EM | Admit: 2019-01-25 | Discharge: 2019-01-25 | Disposition: A | Discharge status: Home / Self Care | Payer: Managed Care, Other (non HMO) | Attending: Internal Medicine | Admitting: Internal Medicine

## 2019-01-25 DIAGNOSIS — J111 Influenza due to unidentified influenza virus with other respiratory manifestations: Secondary | ICD-10-CM | POA: Diagnosis not present

## 2019-01-25 MED ORDER — PREDNISONE 10 MG PO TABS: 20.0000 mg | ORAL_TABLET | Freq: Every day | ORAL | 0 refills | Status: AC

## 2019-01-25 MED ORDER — ALBUTEROL SULFATE HFA 108 (90 BASE) MCG/ACT IN AERS: 2.0000 | INHALATION_SPRAY | RESPIRATORY_TRACT | 1 refills | Status: DC | PRN

## 2019-01-25 MED ORDER — OSELTAMIVIR PHOSPHATE 75 MG PO CAPS: 75.0000 mg | ORAL_CAPSULE | Freq: Two times a day (BID) | ORAL | 0 refills | Status: DC

## 2019-01-25 NOTE — ED Provider Notes (Signed)
[facility]    CSN: [id number] Arrival date & time: 01/25/19  1749     History   Chief Complaint No chief complaint on file.   HPI Mitchell Quinn is a 28 y.o. male.   28 yo male with no chronic medical problems presents to urgent care complaining of body aches and chills 3 days. Admits to Tmax 102.  Also admits to cough.  He has been taking Mucinex for days. He denies nausea, vomiting or rash.  His kids have been diagnosed with the flu.      Past Medical History:  Diagnosis Date  . IBS (irritable bowel syndrome)   . Pneumonia     Patient Active Problem List   Diagnosis Date Noted  . IRRITABLE BOWEL SYNDROME 10/23/2010  . FOLLICULITIS 10/23/2010  . ABDOMINAL PAIN 10/03/2010  . DIARRHEA 09/20/2010  . Sprain of ankle, unspecified site 03/26/2010    Past Surgical History:  Procedure Laterality Date  . ORIF WRIST FRACTURE         Home Medications    Prior to Admission medications   Medication Sig Start Date End Date Taking? Authorizing Provider  acetaminophen (TYLENOL) 500 MG tablet Take 1,000 mg by mouth every 6 (six) hours as needed for headache.    [provider]  albuterol (PROVENTIL HFA;VENTOLIN HFA) 108 (90 Base) MCG/ACT inhaler Inhale 2 puffs into the lungs every 4 (four) hours as needed for wheezing or shortness of breath. 01/25/19   Arnaldo Natal, MD  atomoxetine (STRATTERA) 40 MG capsule Take 40 mg by mouth daily.    [provider]  azithromycin (ZITHROMAX Z-PAK) 250 MG tablet Take 2 tablets PO now and then one tablet daily until finished 08/31/18   Damian Leavell, [place], NP  benzonatate (TESSALON) 100 MG capsule Take 1 capsule (100 mg total) by mouth every 8 (eight) hours. 08/31/18   Janne Napoleon, NP  benzonatate (TESSALON) 100 MG capsule Take 1 capsule (100 mg total) by mouth every 8 (eight) hours. 01/22/19   Sabas Sous, MD  hydrocortisone (ANUSOL-HC) 25 MG suppository Place 1 suppository (25 mg total) rectally 2 (two) times  daily. 04/15/18   Wallis Bamberg, PA-C  ondansetron (ZOFRAN-ODT) 4 MG disintegrating tablet Take 1 tablet (4 mg total) by mouth every 8 (eight) hours as needed for nausea or vomiting. 07/09/18   Mardella Layman, MD  oseltamivir (TAMIFLU) 75 MG capsule Take 1 capsule (75 mg total) by mouth every 12 (twelve) hours. 01/25/19   Arnaldo Natal, MD  predniSONE (DELTASONE) 10 MG tablet Take 2 tablets (20 mg total) by mouth daily for 4 days. 01/25/19 01/29/19  Arnaldo Natal, MD    Family History Family History  Problem Relation Age of Onset  . Healthy Mother   . Healthy Father     Social History Social History   Tobacco Use  . Smoking status: Never Smoker  . Smokeless tobacco: Never Used  Substance Use Topics  . Alcohol use: No  . Drug use: No     Allergies   Patient has no known allergies.   Review of Systems Review of Systems  Constitutional: Negative for chills and fever.  HENT: Negative for sore throat and tinnitus.   Eyes: Negative for redness.  Respiratory: Negative for cough and shortness of breath.   Cardiovascular: Negative for chest pain and palpitations.  Gastrointestinal: Negative for abdominal pain, diarrhea, nausea and vomiting.  Genitourinary: Negative for dysuria, frequency and urgency.  Musculoskeletal: Negative for myalgias.  Skin: Negative for rash.       No lesions  Neurological: Negative for weakness.  Hematological: Does not bruise/bleed easily.  Psychiatric/Behavioral: Negative for suicidal ideas.     Physical Exam Triage Vital Signs ED Triage Vitals  Enc Vitals Group     BP 01/25/19 1823 122/75     Pulse Rate 01/25/19 1823 65     Resp 01/25/19 1823 18     Temp 01/25/19 1823 98.4 F (36.9 C)     Temp Source 01/25/19 1823 Oral     SpO2 01/25/19 1823 100 %     Weight --      Height --      Head Circumference --      Peak Flow --      Pain Score 01/25/19 1848 7     Pain Loc --      Pain Edu? --      Excl. in GC? --    No data  found.  Updated Vital Signs BP 122/75 (BP Location: Left Arm)   Pulse 65   Temp 98.4 F (36.9 C) (Oral)   Resp 18   SpO2 100%   Visual Acuity Right Eye Distance:   Left Eye Distance:   Bilateral Distance:    Right Eye Near:   Left Eye Near:    Bilateral Near:     Physical Exam Constitutional:      General: He is not in acute distress.    Appearance: He is well-developed.  HENT:     Head: Normocephalic and atraumatic.  Eyes:     General: No scleral icterus.    Conjunctiva/sclera: Conjunctivae normal.     Pupils: Pupils are equal, round, and reactive to light.  Neck:     Musculoskeletal: Normal range of motion and neck supple.     Thyroid: No thyromegaly.     Vascular: No JVD.     Trachea: No tracheal deviation.  Cardiovascular:     Rate and Rhythm: Normal rate and regular rhythm.     Heart sounds: Normal heart sounds. No murmur. No friction rub. No gallop.   Pulmonary:     Effort: Pulmonary effort is normal. No respiratory distress.     Breath sounds: Normal breath sounds.  Abdominal:     General: Bowel sounds are normal. There is no distension.     Palpations: Abdomen is soft.     Tenderness: There is no abdominal tenderness.  Musculoskeletal: Normal range of motion.  Lymphadenopathy:     Cervical: No cervical adenopathy.  Skin:    General: Skin is warm and dry.     Findings: No erythema or rash.  Neurological:     Mental Status: He is alert and oriented to person, place, and time.     Cranial Nerves: No cranial nerve deficit.  Psychiatric:        Behavior: Behavior normal.        Thought Content: Thought content normal.        Judgment: Judgment normal.      UC Treatments / Results  Labs (all labs ordered are listed, but only abnormal results are displayed) Labs Reviewed - No data to display  EKG None  Radiology No results found.  Procedures Procedures (including critical care time)  Medications Ordered in UC Medications - No data to  display  Initial Impression / Assessment and Plan / UC Course  I have reviewed the triage vital signs and the nursing notes.  Pertinent labs & imaging  results that were available during my care of the patient were reviewed by me and considered in my medical decision making (see chart for details).     Bronchitis associated with flu.  Rx tamiflu.   Final Clinical Impressions(s) / UC Diagnoses   Final diagnoses:  Influenza with respiratory manifestation   Discharge Instructions   None    ED Prescriptions    Medication Sig Dispense Auth. Provider   oseltamivir (TAMIFLU) 75 MG capsule Take 1 capsule (75 mg total) by mouth every 12 (twelve) hours. 10 capsule Arnaldo Natal, MD   predniSONE (DELTASONE) 10 MG tablet Take 2 tablets (20 mg total) by mouth daily for 4 days. 8 tablet Arnaldo Natal, MD   albuterol (PROVENTIL HFA;VENTOLIN HFA) 108 (90 Base) MCG/ACT inhaler Inhale 2 puffs into the lungs every 4 (four) hours as needed for wheezing or shortness of breath. 1 Inhaler Arnaldo Natal, MD     Controlled Substance Prescriptions [place] Controlled Substance Registry consulted? Not Applicable   Arnaldo Natal, MD 01/25/19 2103

## 2019-02-25 IMAGING — DX DG ANKLE COMPLETE 3+V*L*
3 series · 3 of 3 positions shown · non-contrast
Comparison: None.

CLINICAL DATA: Stepped in hole six days ago. Swelling and bruising.

EXAM:
LEFT ANKLE COMPLETE - 3+ VIEW

[ankle ap]
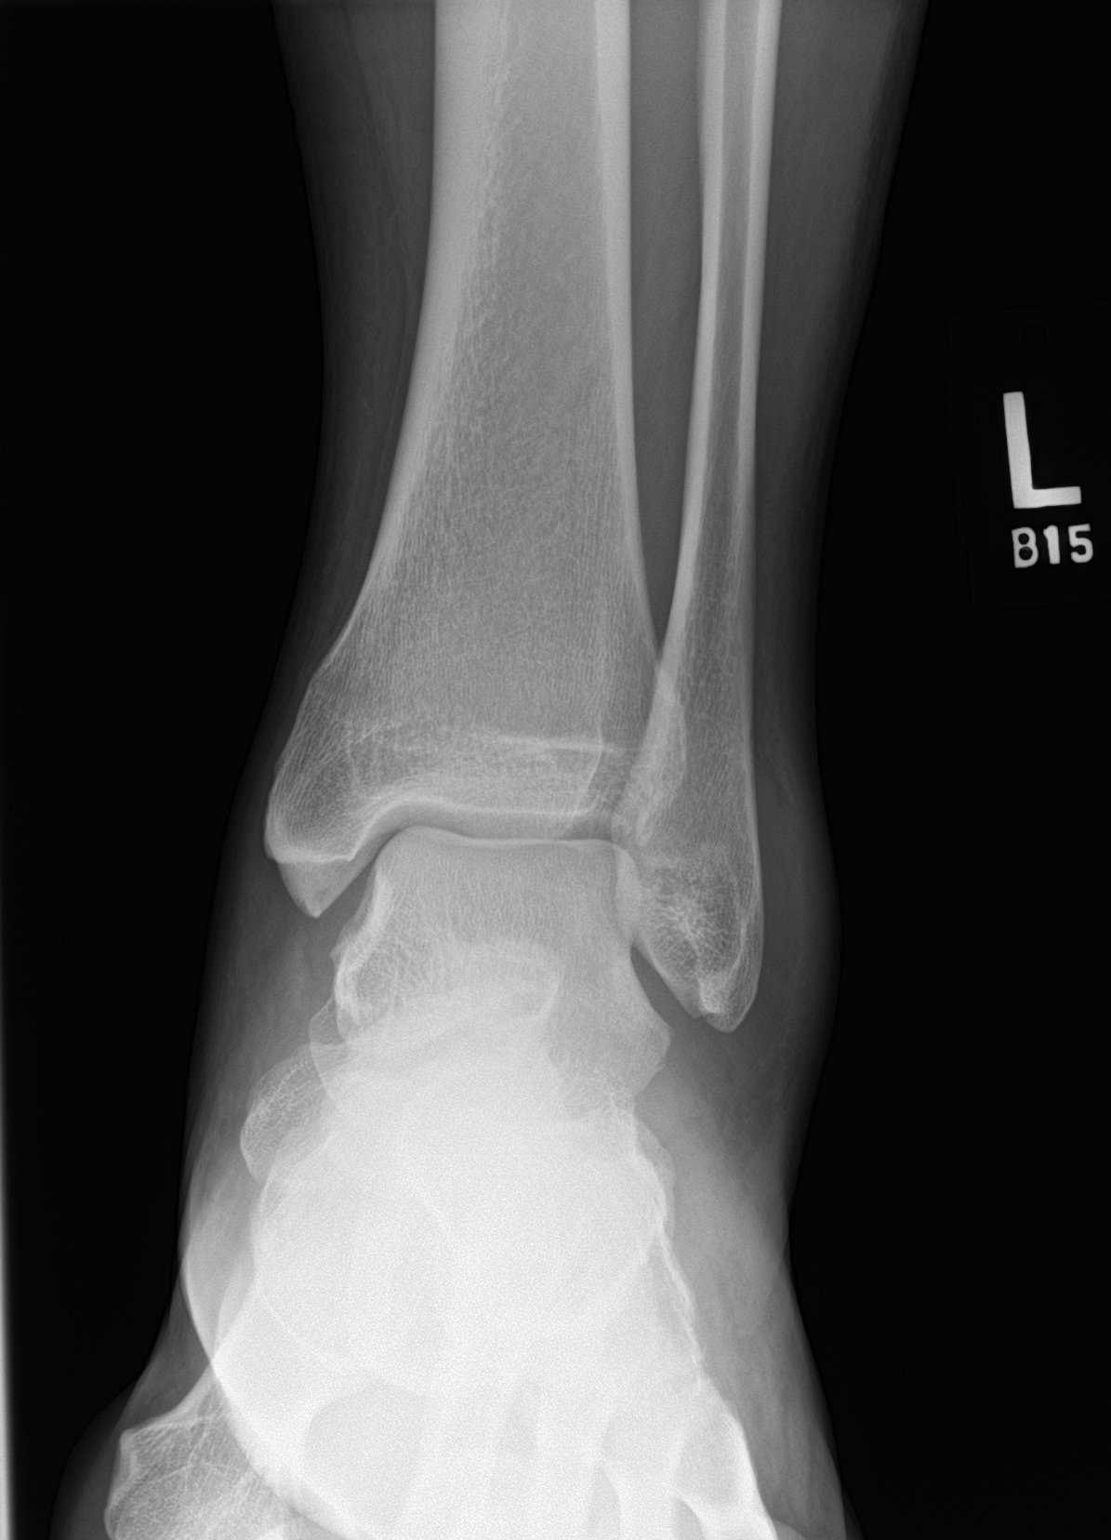

[ankle obl]
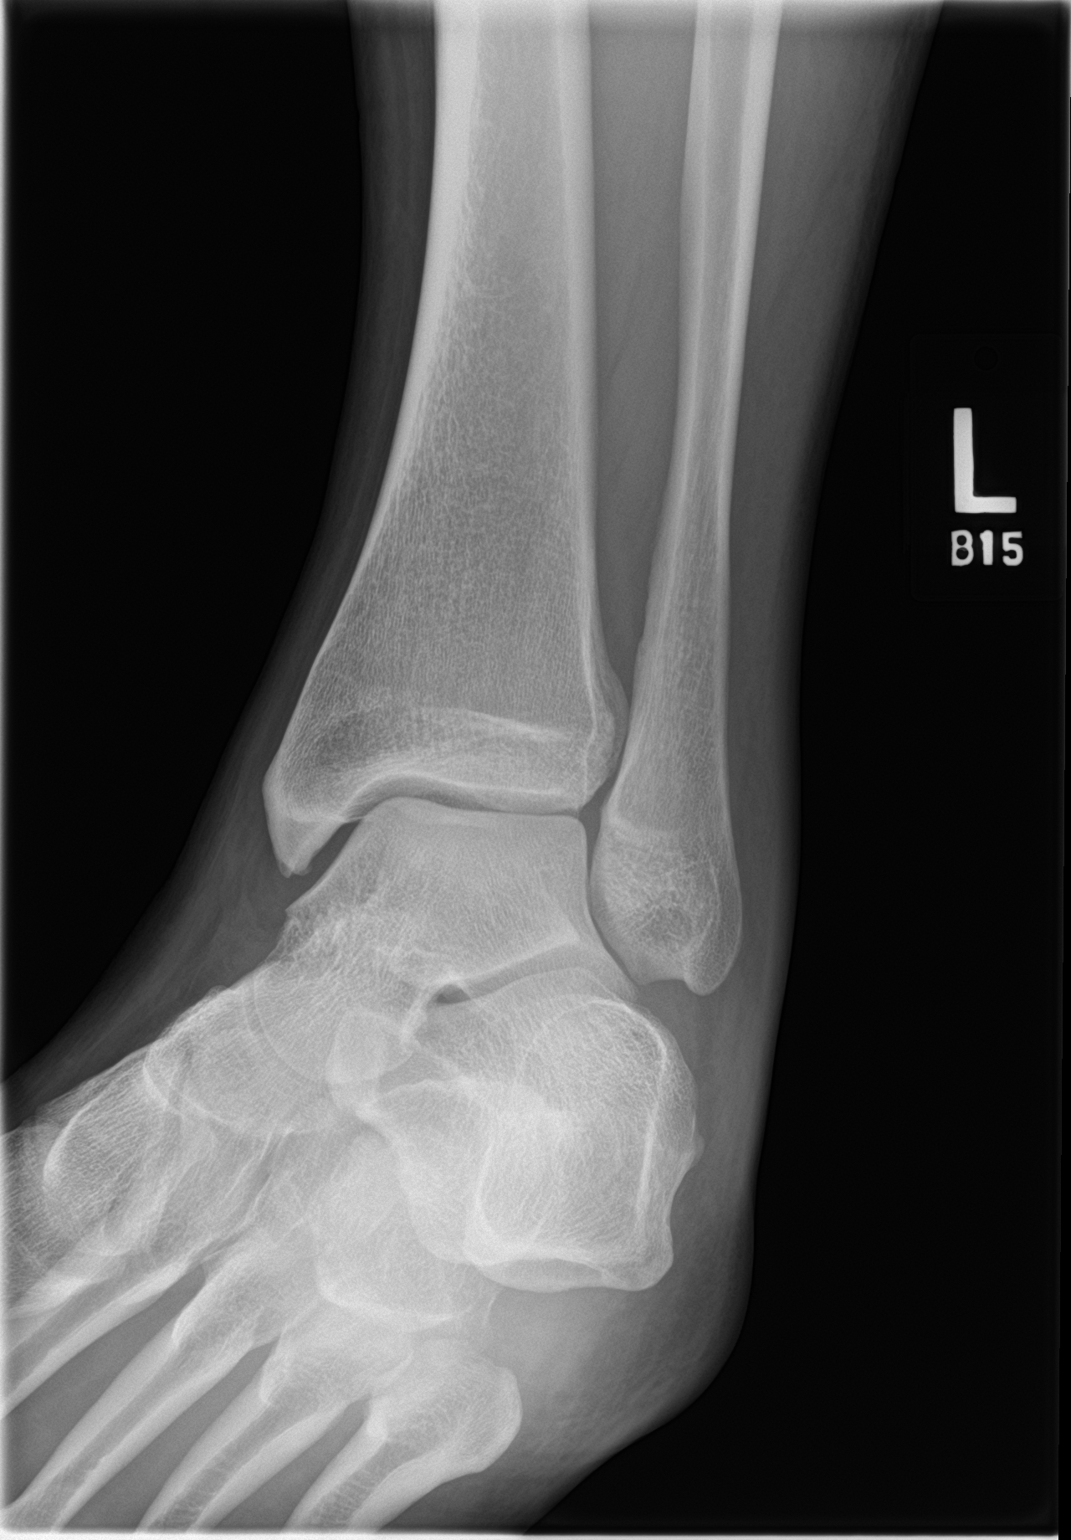

[ankle lat]
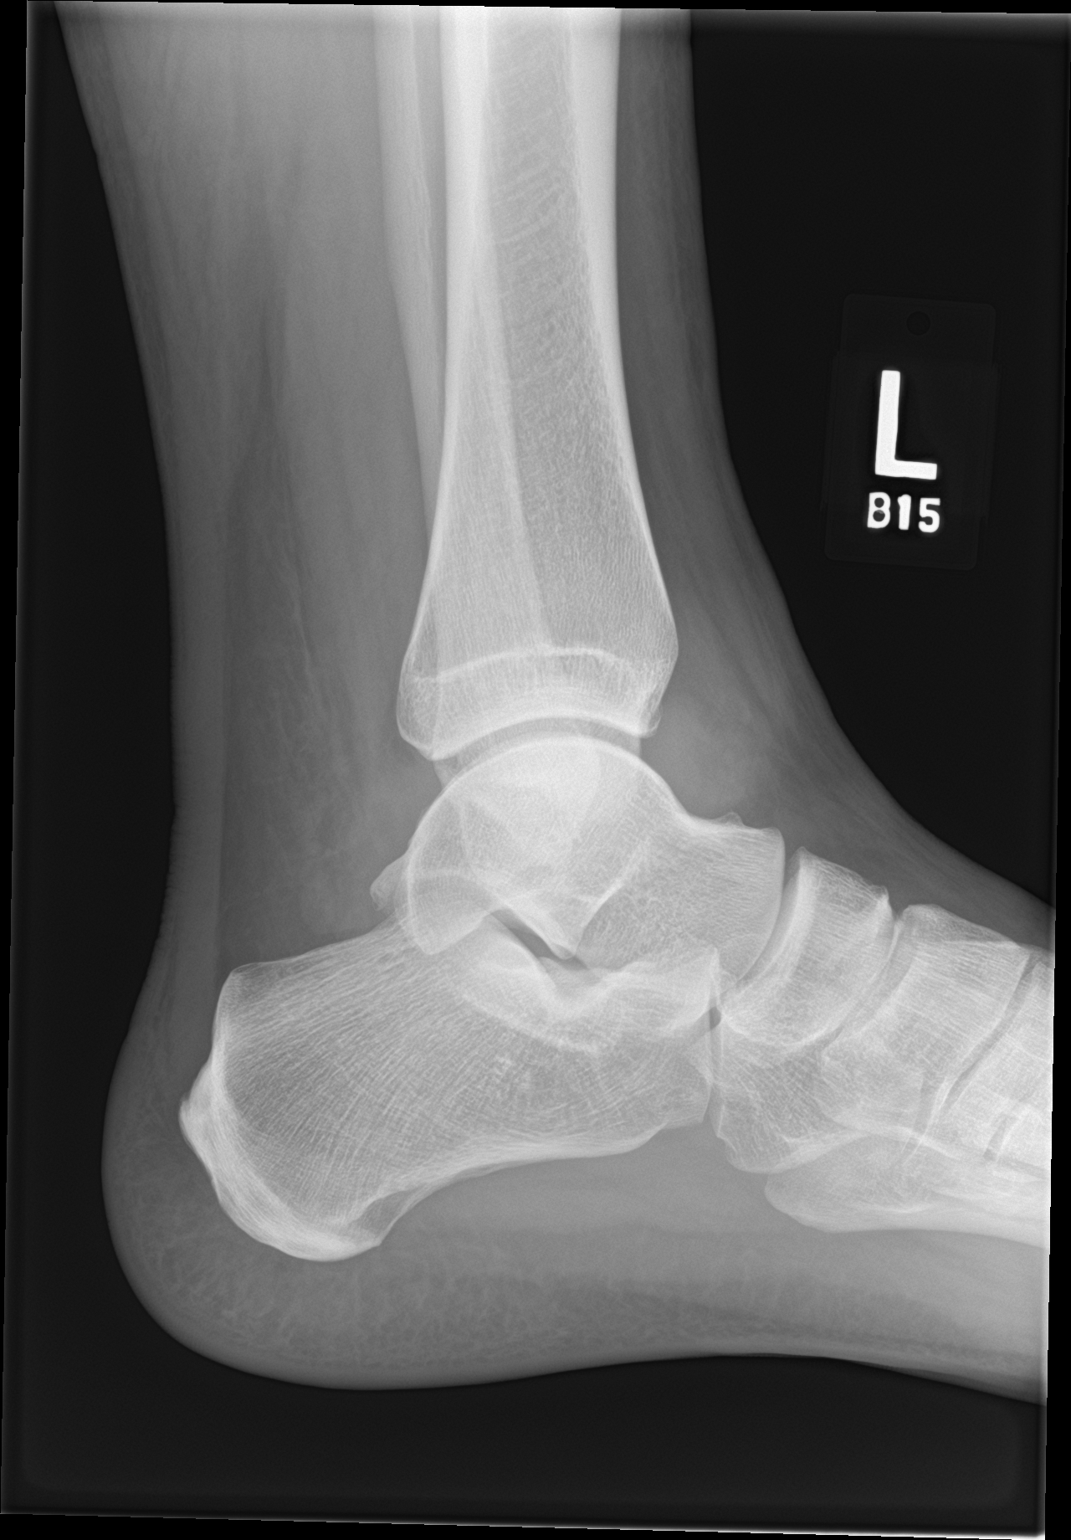

[3 of 3 positions shown; findings below may reference images not displayed]

FINDINGS: Lateral soft tissue swelling. Joint effusion. No evidence of
fracture or dislocation.
IMPRESSION: Lateral soft tissue swelling and joint effusion.

## 2019-05-01 ENCOUNTER — Other Ambulatory Visit: Payer: Self-pay

## 2019-05-01 ENCOUNTER — Encounter (HOSPITAL_COMMUNITY): Payer: Self-pay | Admitting: Emergency Medicine

## 2019-05-01 ENCOUNTER — Emergency Department (HOSPITAL_COMMUNITY)
Admission: EM | Admit: 2019-05-01 | Discharge: 2019-05-01 | Disposition: A | Discharge status: Home / Self Care | Payer: Managed Care, Other (non HMO) | Attending: Emergency Medicine | Admitting: Emergency Medicine

## 2019-05-01 ENCOUNTER — Emergency Department (HOSPITAL_COMMUNITY): Payer: Managed Care, Other (non HMO)

## 2019-05-01 DIAGNOSIS — R059 Cough, unspecified: Secondary | ICD-10-CM

## 2019-05-01 DIAGNOSIS — R05 Cough: Secondary | ICD-10-CM | POA: Diagnosis present

## 2019-05-01 DIAGNOSIS — G44209 Tension-type headache, unspecified, not intractable: Secondary | ICD-10-CM | POA: Insufficient documentation

## 2019-05-01 DIAGNOSIS — R0789 Other chest pain: Secondary | ICD-10-CM | POA: Insufficient documentation

## 2019-05-01 DIAGNOSIS — R0781 Pleurodynia: Secondary | ICD-10-CM

## 2019-05-01 LAB — CBC WITH DIFFERENTIAL/PLATELET
Abs Immature Granulocytes: 0.08 10*3/uL — ABNORMAL HIGH (ref 0.00–0.07)
Basophils Absolute: 0.1 10*3/uL (ref 0.0–0.1)
Basophils Relative: 1 %
Eosinophils Absolute: 0.1 10*3/uL (ref 0.0–0.5)
Eosinophils Relative: 1 %
HCT: 42.3 % (ref 39.0–52.0)
Hemoglobin: 15.1 g/dL (ref 13.0–17.0)
Immature Granulocytes: 1 %
Lymphocytes Relative: 20 %
Lymphs Abs: 1.5 10*3/uL (ref 0.7–4.0)
MCH: 31.8 pg (ref 26.0–34.0)
MCHC: 35.7 g/dL (ref 30.0–36.0)
MCV: 89.1 fL (ref 80.0–100.0)
Monocytes Absolute: 0.5 10*3/uL (ref 0.1–1.0)
Monocytes Relative: 6 %
Neutro Abs: 5.5 10*3/uL (ref 1.7–7.7)
Neutrophils Relative %: 71 %
Platelets: 313 10*3/uL (ref 150–400)
RBC: 4.75 MIL/uL (ref 4.22–5.81)
RDW: 12 % (ref 11.5–15.5)
WBC: 7.7 10*3/uL (ref 4.0–10.5)
nRBC: 0 % (ref 0.0–0.2)

## 2019-05-01 LAB — COMPREHENSIVE METABOLIC PANEL
ALT: 26 U/L (ref 0–44)
AST: 22 U/L (ref 15–41)
Albumin: 4.5 g/dL (ref 3.5–5.0)
Alkaline Phosphatase: 63 U/L (ref 38–126)
Anion gap: 11 (ref 5–15)
BUN: 11 mg/dL (ref 6–20)
CO2: 26 mmol/L (ref 22–32)
Calcium: 9.7 mg/dL (ref 8.9–10.3)
Chloride: 102 mmol/L (ref 98–111)
Creatinine, Ser: 0.91 mg/dL (ref 0.61–1.24)
GFR calc Af Amer: >60 mL/min (ref >60)
GFR calc non Af Amer: >60 mL/min (ref >60)
Glucose, Bld: 99 mg/dL (ref 70–99)
Potassium: 4.2 mmol/L (ref 3.5–5.1)
Sodium: 139 mmol/L (ref 135–145)
Total Bilirubin: 0.3 mg/dL (ref 0.3–1.2)
Total Protein: 7.6 g/dL (ref 6.5–8.1)

## 2019-05-01 LAB — D-DIMER, QUANTITATIVE: D-Dimer, Quant: <0.27 ug/mL-FEU (ref 0.00–0.50)

## 2019-05-01 LAB — TROPONIN I: Troponin I: <0.03 ng/mL (ref <0.03)

## 2019-05-01 LAB — LIPASE, BLOOD: Lipase: 37 U/L (ref 11–51)

## 2019-05-01 MED ORDER — SODIUM CHLORIDE 0.9 % IV BOLUS
1000.0000 mL | Freq: Once | INTRAVENOUS | Status: AC
  Administered 2019-05-01: 19:00:00 1000 mL via INTRAVENOUS

## 2019-05-01 NOTE — ED Provider Notes (Signed)
Emergency Department Provider Note   I have reviewed the triage vital signs and the nursing notes.   HISTORY  Chief Complaint Chest Pain   HPI Mitchell Quinn is a 28 y.o. male with PMH of IBS presents to the emergency department for evaluation of dry cough, right-sided chest pain, diarrhea, fatigue.  Patient has had 3 to 4 days of symptoms.  He denies any fever or body aches.  He denies feeling significant shortness of breath symptoms.  He states he try to go to work this evening but felt much worse and had to leave.  He has had close contact with a COVID 19 positive individual at work.  He tells me that the diarrhea symptoms and mild to moderate abdominal cramping are typical of his IBS symptoms.  He denies any blood in the stools.  No hemoptysis.  Patient's right-sided chest pain is mainly with deep inspiration. No PE history. Patient does note that he has developed intermittent HA and feeling "out of it" but no severe, sudden onset HA.    Past Medical History:  Diagnosis Date   IBS (irritable bowel syndrome)    Pneumonia     Patient Active Problem List   Diagnosis Date Noted   IRRITABLE BOWEL SYNDROME 10/23/2010   FOLLICULITIS 10/23/2010   ABDOMINAL PAIN 10/03/2010   DIARRHEA 09/20/2010   Sprain of ankle, unspecified site 03/26/2010    Past Surgical History:  Procedure Laterality Date   ORIF WRIST FRACTURE      Allergies Patient has no known allergies.  Family History  Problem Relation Age of Onset   Healthy Mother    Healthy Father     Social History Social History   Tobacco Use   Smoking status: Never Smoker   Smokeless tobacco: Current User    Types: Snuff  Substance Use Topics   Alcohol use: No   Drug use: Yes    Comment: occas    Review of Systems  Constitutional: No fever/chills. Positive fatigue and feeling "out of it" intermittently.  Eyes: No visual changes. ENT: No sore throat. Cardiovascular: Positive chest  pain. Respiratory: Denies shortness of breath. Positive dry cough.  Gastrointestinal: No abdominal pain. No nausea, no vomiting. No diarrhea. No constipation. Genitourinary: Negative for dysuria. Musculoskeletal: Negative for back pain. Skin: Negative for rash. Neurological: Negative for focal weakness or numbness. Positive HA.   10-point ROS otherwise negative.  ____________________________________________   PHYSICAL EXAM:  VITAL SIGNS: ED Triage Vitals  Enc Vitals Group     BP 05/01/19 1850 139/83     Pulse Rate 05/01/19 1847 80     Resp 05/01/19 1847 17     Temp 05/01/19 1850 (!) 97.3 F (36.3 C)     Temp Source 05/01/19 1847 Oral     SpO2 05/01/19 1847 100 %     Weight 05/01/19 1851 240 lb (108.9 kg)     Height 05/01/19 1851 6' (1.829 m)     Pain Score 05/01/19 1850 5   Constitutional: Alert and oriented. Well appearing and in no acute distress. Eyes: Conjunctivae are normal.  Head: Atraumatic. Nose: No congestion/rhinnorhea. Mouth/Throat: Mucous membranes are moist.  Neck: No stridor.   Cardiovascular: Normal rate, regular rhythm. Good peripheral circulation. Grossly normal heart sounds.   Respiratory: Normal respiratory effort.  No retractions. Lungs CTAB. Gastrointestinal: Soft with very mild diffuse discomfort with palpation. No focal tenderness, rebound, or guarding. No distention.  Musculoskeletal: No lower extremity tenderness nor edema. No gross deformities of extremities.  Neurologic:  Normal speech and language. No gross focal neurologic deficits are appreciated.  Skin:  Skin is warm, dry and intact. No rash noted.   ____________________________________________   LABS (all labs ordered are listed, but only abnormal results are displayed)  Labs Reviewed  CBC WITH DIFFERENTIAL/PLATELET - Abnormal; Notable for the following components:      Result Value   Abs Immature Granulocytes 0.08 (*)    All other components within normal limits  COMPREHENSIVE  METABOLIC PANEL  LIPASE, BLOOD  TROPONIN I  D-DIMER, QUANTITATIVE (NOT AT [facility])   ____________________________________________  EKG   EKG Interpretation  Date/Time:  Saturday May 01 2019 18:50:20 EDT Ventricular Rate:  78 PR Interval:    QRS Duration: 100 QT Interval:  377 QTC Calculation: 430 R Axis:   76 Text Interpretation:  Sinus rhythm ST elev, probable normal early repol pattern No STEMI.  Similar to 2015 tracing.  Confirmed by Alona Bene [phone]) on 05/01/2019 7:12:25 PM       ____________________________________________  RADIOLOGY  Dg Chest Portable 1 View  Result Date: 05/01/2019 CLINICAL DATA:  Chest pain, dry cough, pain with inspiration, diaphoresis, episodes of confusion over the past 3 days, diarrhea for 4 days EXAM: PORTABLE CHEST 1 VIEW COMPARISON:  Portable exam 1927 hours compared to 01/22/2019 FINDINGS: Normal heart size, mediastinal contours, and pulmonary vascularity. Lungs clear. No infiltrate, pleural effusion or pneumothorax. Bones unremarkable. IMPRESSION: No acute abnormalities. Electronically Signed   By: Ulyses Southward M.D.   On: 05/01/2019 19:44    ____________________________________________   PROCEDURES  Procedure(s) performed:   Procedures  None  ____________________________________________   INITIAL IMPRESSION / ASSESSMENT AND PLAN / ED COURSE  Pertinent labs & imaging results that were available during my care of the patient were reviewed by me and considered in my medical decision making (see chart for details).   Patient presents to the emergency department with dry cough, inspiratory chest pain, diarrhea symptoms typical of his IBS.  He has normal vital signs including heart rate and pulse ox.  His EKG is similar to tracing in 2015 with normal early re-pole pattern.  No evidence of arrhythmia or acute ischemic changes.  Patient does have a positive COVID contact but no severe symptoms.  Suspicion for COVID-19 is low.  Patient does not  meet current testing guidelines for [facility] as he is unlikely to be admitted to the hospital.  Plan for screening chest x-ray, labs including d-dimer, IV fluids.  Considering PE is a possibility but feel this is also less likely with normal vital signs.  Patient's pain is somewhat pleuritic by description. ACS is very unlikely.   Labs pending. CXR reviewed. Care transferred to Dr. Adriana Simas. Anticipate discharge with normal labs. Will not trend troponin. Plan to stay home from work with note provided.   Mitchell Quinn was evaluated in Emergency Department on 05/02/2019 for the symptoms described in the history of present illness. He was evaluated in the context of the global COVID-19 pandemic, which necessitated consideration that the patient might be at risk for infection with the SARS-CoV-2 virus that causes COVID-19. Institutional protocols and algorithms that pertain to the evaluation of patients at risk for COVID-19 are in a state of rapid change based on information released by regulatory bodies including the CDC and federal and state organizations. These policies and algorithms were followed during the patient's care in the ED.  ____________________________________________  FINAL CLINICAL IMPRESSION(S) / ED DIAGNOSES  Final diagnoses:  Pleuritic chest pain  Cough  Acute non intractable tension-type headache    MEDICATIONS GIVEN DURING THIS VISIT:  Medications  sodium chloride 0.9 % bolus 1,000 mL (0 mLs Intravenous Stopped 05/01/19 2044)    Note:  This document was prepared using Dragon voice recognition software and may include unintentional dictation errors.  Alona Bene, MD Emergency Medicine    Sharlyne Koeneman, Arlyss Repress, MD 05/02/19 1213

## 2019-05-01 NOTE — ED Provider Notes (Signed)
Cardiac work-up negative.  Screening labs, chest x-ray, EKG all normal.   Donnetta Hutching, MD 05/01/19 2028

## 2019-05-01 NOTE — ED Triage Notes (Signed)
Patient reports dry cough and pain with inspiration. Patient diaphoretic on arrival. Patient states he has had periods of confusion over the past 3 days. Patient says he has had diarrhea x 4 days.

## 2019-05-01 NOTE — Discharge Instructions (Addendum)

## 2019-07-12 ENCOUNTER — Ambulatory Visit (HOSPITAL_COMMUNITY)
Admission: EM | Admit: 2019-07-12 | Discharge: 2019-07-12 | Disposition: A | Discharge status: Home / Self Care | Payer: Managed Care, Other (non HMO) | Attending: Urgent Care | Admitting: Urgent Care

## 2019-07-12 ENCOUNTER — Encounter (HOSPITAL_COMMUNITY): Payer: Self-pay | Admitting: Urgent Care

## 2019-07-12 ENCOUNTER — Other Ambulatory Visit: Payer: Self-pay

## 2019-07-12 DIAGNOSIS — T148XXA Other injury of unspecified body region, initial encounter: Secondary | ICD-10-CM

## 2019-07-12 DIAGNOSIS — W450XXA Nail entering through skin, initial encounter: Secondary | ICD-10-CM | POA: Diagnosis not present

## 2019-07-12 DIAGNOSIS — S91332A Puncture wound without foreign body, left foot, initial encounter: Secondary | ICD-10-CM | POA: Diagnosis not present

## 2019-07-12 DIAGNOSIS — Z23 Encounter for immunization: Secondary | ICD-10-CM

## 2019-07-12 DIAGNOSIS — M79672 Pain in left foot: Secondary | ICD-10-CM

## 2019-07-12 MED ORDER — TETANUS-DIPHTH-ACELL PERTUSSIS 5-2.5-18.5 LF-MCG/0.5 IM SUSP
INTRAMUSCULAR | Status: AC
  Filled 2019-07-12: qty 0.5

## 2019-07-12 MED ORDER — TETANUS-DIPHTH-ACELL PERTUSSIS 5-2.5-18.5 LF-MCG/0.5 IM SUSP
0.5000 mL | Freq: Once | INTRAMUSCULAR | Status: AC
  Administered 2019-07-12: 20:00:00 0.5 mL via INTRAMUSCULAR

## 2019-07-12 NOTE — ED Triage Notes (Signed)
Pt presents with puncture wound on left foot after stepping on a nail today.

## 2019-07-12 NOTE — Discharge Instructions (Addendum)
You may take 500mg  Tylenol with ibuprofen 400-600mg  every 6 hours for pain and inflammation. If you develop worsening redness, drainage of pus or bleeding about your wound, then please come back so that we can evaluate you and start antibiotics as these may be signs that your wound developed infection. But otherwise, keep your wound, dry, covered.

## 2019-07-12 NOTE — ED Provider Notes (Addendum)
  MRN: [id number] DOB: 1991-12-17  Subjective:   Mitchell Quinn is a 28 y.o. male presenting for acute onset of mild to moderate left foot pain from injury sustained while at work today.  Patient reports that he stepped on a very rusty nail with his left foot.  Currently reports some mild pain.  He cannot recall his last Tdap.  The [facility] registry search shows the patient has not had a Tdap and is only done the DTaP's as a child.  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  ROS  Objective:   Vitals: BP (!) 148/91 (BP Location: Left Arm)   Pulse 86   Temp 98.2 F (36.8 C) (Oral)   Resp 18   SpO2 98%   Physical Exam Constitutional:      Appearance: Normal appearance. He is well-developed and normal weight.  HENT:     Head: Normocephalic and atraumatic.     Right Ear: External ear normal.     Left Ear: External ear normal.     Nose: Nose normal.     Mouth/Throat:     Pharynx: Oropharynx is clear.  Eyes:     Extraocular Movements: Extraocular movements intact.     Pupils: Pupils are equal, round, and reactive to light.  Cardiovascular:     Rate and Rhythm: Normal rate.  Pulmonary:     Effort: Pulmonary effort is normal.  Musculoskeletal:       Feet:  Neurological:     Mental Status: He is alert and oriented to person, place, and time.  Psychiatric:        Mood and Affect: Mood normal.        Behavior: Behavior normal.     Assessment and Plan :   1. Puncture wound   2. Left foot pain     Counseled patient on signs of infection, updated his Tdap.  Use Tylenol and ibuprofen for pain and inflammation.  Counseled patient on potential for adverse effects with medications prescribed/recommended today, ER and return-to-clinic precautions discussed, patient verbalized understanding.   Jaynee Eagles, PA-C 07/13/19 1002

## 2019-07-25 ENCOUNTER — Other Ambulatory Visit: Payer: Self-pay

## 2019-07-25 ENCOUNTER — Encounter (HOSPITAL_COMMUNITY): Payer: Self-pay | Admitting: Physician Assistant

## 2019-07-25 ENCOUNTER — Ambulatory Visit (HOSPITAL_COMMUNITY)
Admission: EM | Admit: 2019-07-25 | Discharge: 2019-07-25 | Disposition: A | Discharge status: Home / Self Care | Payer: Managed Care, Other (non HMO) | Attending: Physician Assistant | Admitting: Physician Assistant

## 2019-07-25 DIAGNOSIS — R197 Diarrhea, unspecified: Secondary | ICD-10-CM | POA: Diagnosis not present

## 2019-07-25 DIAGNOSIS — K6289 Other specified diseases of anus and rectum: Secondary | ICD-10-CM

## 2019-07-25 DIAGNOSIS — R1084 Generalized abdominal pain: Secondary | ICD-10-CM

## 2019-07-25 MED ORDER — HYDROCORTISONE ACETATE 25 MG RE SUPP: 25.0000 mg | Freq: Two times a day (BID) | RECTAL | 0 refills | Status: DC

## 2019-07-25 MED ORDER — DICYCLOMINE HCL 20 MG PO TABS: 20.0000 mg | ORAL_TABLET | Freq: Two times a day (BID) | ORAL | 0 refills | Status: DC

## 2019-07-25 MED ORDER — NYSTATIN 100000 UNIT/GM EX CREA: TOPICAL_CREAM | CUTANEOUS | 0 refills | Status: DC

## 2019-07-25 NOTE — ED Provider Notes (Signed)
[facility]    CSN: [id number] Arrival date & time: 07/25/19  1321     History   Chief Complaint Chief Complaint  Patient presents with  . Hemorrhoids    HPI Mitchell Quinn is a 28 y.o. male.   28 year old male with history of hemorrhoids comes in for 2 day history of diarrhea, abdominal pain, perirectal pain. Patient had 7 episodes of diarrhea yesterday, and started having blood with wiping after the first 1-2 BMs. Diarrhea has improved since yesterday. He has generalized abdominal pain that occurs right before a bowel movement, and resolves during BMs. He denies nausea, vomiting. Denies fever, chills, body aches. Denies URI symptoms such as cough, congestion, sore throat. Denies loss of taste/smell, shortness of breath. Since diarrhea, he has had perirectal pain that is worse with BM. He has been using a suppository, but unsure what type, which is not helping.      Past Medical History:  Diagnosis Date  . IBS (irritable bowel syndrome)   . Pneumonia     Patient Active Problem List   Diagnosis Date Noted  . IRRITABLE BOWEL SYNDROME 10/23/2010  . FOLLICULITIS 10/23/2010  . ABDOMINAL PAIN 10/03/2010  . DIARRHEA 09/20/2010  . Sprain of ankle, unspecified site 03/26/2010    Past Surgical History:  Procedure Laterality Date  . ORIF WRIST FRACTURE         Home Medications    Prior to Admission medications   Medication Sig Start Date End Date Taking? Authorizing Provider  acetaminophen (TYLENOL) 500 MG tablet Take 1,000 mg by mouth every 6 (six) hours as needed for headache.    [provider]  albuterol (PROVENTIL HFA;VENTOLIN HFA) 108 (90 Base) MCG/ACT inhaler Inhale 2 puffs into the lungs every 4 (four) hours as needed for wheezing or shortness of breath. 01/25/19   Arnaldo Nataliamond, Michael S, MD  atomoxetine (STRATTERA) 40 MG capsule Take 40 mg by mouth daily.    [provider]  dicyclomine (BENTYL) 20 MG tablet Take 1 tablet (20 mg total)  by mouth 2 (two) times daily. 07/25/19   Belinda FisherYu, Alessio Bogan V, PA-C  hydrocortisone (ANUSOL-HC) 25 MG suppository Place 1 suppository (25 mg total) rectally 2 (two) times daily. 07/25/19   Belinda FisherYu, Aneliz Carbary V, PA-C  nystatin cream (MYCOSTATIN) Apply to affected area 2 times daily 07/25/19   Cathie HoopsYu, Lynette Topete V, PA-C  ondansetron (ZOFRAN-ODT) 4 MG disintegrating tablet Take 1 tablet (4 mg total) by mouth every 8 (eight) hours as needed for nausea or vomiting. 07/09/18   Mardella LaymanHagler, Brian, MD    Family History Family History  Problem Relation Age of Onset  . Healthy Mother   . Healthy Father     Social History Social History   Tobacco Use  . Smoking status: Never Smoker  . Smokeless tobacco: Current User    Types: Snuff  Substance Use Topics  . Alcohol use: No  . Drug use: Yes    Comment: occas     Allergies   Patient has no known allergies.   Review of Systems Review of Systems  Reason unable to perform ROS: See HPI as above.     Physical Exam Triage Vital Signs ED Triage Vitals  Enc Vitals Group     BP 07/25/19 1420 120/75     Pulse Rate 07/25/19 1419 61     Resp 07/25/19 1419 16     Temp --      Temp src --      SpO2 07/25/19  1419 98 %     Weight --      Height --      Head Circumference --      Peak Flow --      Pain Score --      Pain Loc --      Pain Edu? --      Excl. in GC? --    No data found.  Updated Vital Signs BP 120/75   Pulse 61   Resp 16   SpO2 98%   Visual Acuity Right Eye Distance:   Left Eye Distance:   Bilateral Distance:    Right Eye Near:   Left Eye Near:    Bilateral Near:     Physical Exam Exam conducted with a chaperone present.  Constitutional:      General: He is not in acute distress.    Appearance: He is well-developed. He is not diaphoretic.  HENT:     Head: Normocephalic and atraumatic.  Eyes:     Conjunctiva/sclera: Conjunctivae normal.     Pupils: Pupils are equal, round, and reactive to light.  Cardiovascular:     Rate and Rhythm: Normal  rate and regular rhythm.     Heart sounds: Normal heart sounds. No murmur. No friction rub. No gallop.   Pulmonary:     Effort: Pulmonary effort is normal.     Breath sounds: Normal breath sounds. No wheezing or rales.  Abdominal:     General: Bowel sounds are normal.     Palpations: Abdomen is soft.     Tenderness: There is no abdominal tenderness. There is no right CVA tenderness, left CVA tenderness, guarding or rebound.  Genitourinary:    Comments: Erythema to the perirectal area extending to the perineum. No warmth, no tenderness.   No obvious external hemorrhoid seen. Mild tenderness to palpation of the rectum. No warmth, no swelling, no fluctuance.  DRE without tenderness to the rectal wall.  Skin:    General: Skin is warm and dry.  Neurological:     Mental Status: He is alert and oriented to person, place, and time.  Psychiatric:        Behavior: Behavior normal.        Judgment: Judgment normal.      UC Treatments / Results  Labs (all labs ordered are listed, but only abnormal results are displayed) Labs Reviewed - No data to display  EKG   Radiology No results found.  Procedures Procedures (including critical care time)  Medications Ordered in UC Medications - No data to display  Initial Impression / Assessment and Plan / UC Course  I have reviewed the triage vital signs and the nursing notes.  Pertinent labs & imaging results that were available during my care of the patient were reviewed by me and considered in my medical decision making (see chart for details).    Will provide bentyl to help with abdominal pain during BMs. Discussed no obvious hemorrhoids seen on exam. ?irritation from diarrhea. Erythema around rectal area with possible yeast dermatitis. Will provide nystatin. anusol as needed. Return precautions given. Patient expresses understanding and agrees to plan.  Final Clinical Impressions(s) / UC Diagnoses   Final diagnoses:  Diarrhea,  unspecified type  Generalized abdominal pain  Rectal pain     Discharge Instructions     Start bentyl as directed for abdominal cramping. Anusol as needed for hemorrhoids. Nystatin for area surrounding rectum to cover for possible yeast. Sitz baths daily. Keep hydrated, you  urine should be clear to pale yellow in color. Bland diet, advance as tolerated. Monitor for any worsening of symptoms, nausea or vomiting not controlled by medication, worsening abdominal pain, fever, follow-up for reevaluation. If worsening pain to the rectum with swelling, fever, go to the ED for further evaluation needed.     ED Prescriptions    Medication Sig Dispense Auth. Provider   hydrocortisone (ANUSOL-HC) 25 MG suppository Place 1 suppository (25 mg total) rectally 2 (two) times daily. 12 suppository Phylicia Mcgaugh V, PA-C   nystatin cream (MYCOSTATIN) Apply to affected area 2 times daily 30 g Saralynn Langhorst V, PA-C   dicyclomine (BENTYL) 20 MG tablet Take 1 tablet (20 mg total) by mouth 2 (two) times daily. 20 tablet Tobin Chad, [place] 07/25/19 2125

## 2019-07-25 NOTE — Discharge Instructions (Signed)
Start bentyl as directed for abdominal cramping. Anusol as needed for hemorrhoids. Nystatin for area surrounding rectum to cover for possible yeast. Sitz baths daily. Keep hydrated, you urine should be clear to pale yellow in color. Bland diet, advance as tolerated. Monitor for any worsening of symptoms, nausea or vomiting not controlled by medication, worsening abdominal pain, fever, follow-up for reevaluation. If worsening pain to the rectum with swelling, fever, go to the ED for further evaluation needed.

## 2019-07-25 NOTE — ED Triage Notes (Signed)
Pt has hx of hemmoroids and has been having a flare up and hurting.Pt having blood in stool. Using suppositories but not helping.

## 2019-07-30 ENCOUNTER — Telehealth: Payer: Managed Care, Other (non HMO) | Admitting: Nurse Practitioner

## 2019-07-30 DIAGNOSIS — R410 Disorientation, unspecified: Secondary | ICD-10-CM

## 2019-07-30 DIAGNOSIS — R0602 Shortness of breath: Secondary | ICD-10-CM

## 2019-07-30 DIAGNOSIS — M791 Myalgia, unspecified site: Secondary | ICD-10-CM

## 2019-07-30 DIAGNOSIS — R059 Cough, unspecified: Secondary | ICD-10-CM

## 2019-07-30 DIAGNOSIS — R519 Headache, unspecified: Secondary | ICD-10-CM

## 2019-07-30 DIAGNOSIS — Z20822 Contact with and (suspected) exposure to covid-19: Secondary | ICD-10-CM

## 2019-07-30 DIAGNOSIS — R05 Cough: Secondary | ICD-10-CM

## 2019-07-30 MED ORDER — ALBUTEROL SULFATE HFA 108 (90 BASE) MCG/ACT IN AERS: 2.0000 | INHALATION_SPRAY | Freq: Four times a day (QID) | RESPIRATORY_TRACT | 0 refills | Status: DC | PRN

## 2019-07-30 MED ORDER — BENZONATATE 100 MG PO CAPS: 100.0000 mg | ORAL_CAPSULE | Freq: Three times a day (TID) | ORAL | 0 refills | Status: DC | PRN

## 2019-07-30 NOTE — Progress Notes (Signed)
E-Visit for Corona Virus Screening   Your current symptoms could be consistent with the coronavirus.  Many health care providers can now test patients at their office but not all are.  [place] has multiple testing sites. For information on our COVID testing locations and hours go to [email or web address]  Please quarantine yourself while awaiting your test results.  We are enrolling you in our MyChart Home Montioring for COVID19 . Daily you will receive a questionnaire within the MyChart website. Our COVID 19 response team willl be monitoriing your responses daily.  * you no longer need an order or an appointment to be corona tested. Just open the web site above and you will be able to see the locations and times for testing.   COVID-19 is a respiratory illness with symptoms that are similar to the flu. Symptoms are typically mild to moderate, but there have been cases of severe illness and death due to the virus. The following symptoms may appear 2-14 days after exposure: . Fever . Cough . Shortness of breath or difficulty breathing . Chills . Repeated shaking with chills . Muscle pain . Headache . Sore throat . New loss of taste or smell . Fatigue . Congestion or runny nose . Nausea or vomiting . Diarrhea  It is vitally important that if you feel that you have an infection such as this virus or any other virus that you stay home and away from places where you may spread it to others.  You should self-quarantine for 14 days if you have symptoms that could potentially be coronavirus or have been in close contact a with a person diagnosed with COVID-19 within the last 2 weeks. You should avoid contact with people age 30 and older.   You should wear a mask or cloth face covering over your nose and mouth if you must be around other people or animals, including pets (even at home). Try to stay at least 6 feet away from other people. This will protect the people  around you.  You can use medication such as A prescription cough medication called Tessalon Perles 100 mg. You may take 1-2 capsules every 8 hours as needed for cough and A prescription inhaler called Albuterol MDI 90 mcg /actuation 2 puffs every 4 hours as needed for shortness of breath, wheezing, cough  You may also take acetaminophen (Tylenol) as needed for fever.   Reduce your risk of any infection by using the same precautions used for avoiding the common cold or flu:  Marland Kitchen. Wash your hands often with soap and warm water for at least 20 seconds.  If soap and water are not readily available, use an alcohol-based hand sanitizer with at least 60% alcohol.  . If coughing or sneezing, cover your mouth and nose by coughing or sneezing into the elbow areas of your shirt or coat, into a tissue or into your sleeve (not your hands). . Avoid shaking hands with others and consider head nods or verbal greetings only. . Avoid touching your eyes, nose, or mouth with unwashed hands.  . Avoid close contact with people who are sick. . Avoid places or events with large numbers of people in one location, like concerts or sporting events. . Carefully consider travel plans you have or are making. . If you are planning any travel outside or inside the [place], visit the CDC's Travelers' Health webpage for the latest health notices. . If you have some symptoms but not all symptoms, continue to monitor at  home and seek medical attention if your symptoms worsen. . If you are having a medical emergency, call 911.  HOME CARE . Only take medications as instructed by your medical team. . Drink plenty of fluids and get plenty of rest. . A steam or ultrasonic humidifier can help if you have congestion.   GET HELP RIGHT AWAY IF YOU HAVE EMERGENCY WARNING SIGNS** FOR COVID-19. If you or someone is showing any of these signs seek emergency medical care immediately. Call 911 or proceed to your closest emergency facility if: . You  develop worsening high fever. . Trouble breathing . Bluish lips or face . Persistent pain or pressure in the chest . New confusion . Inability to wake or stay awake . You cough up blood. . Your symptoms become more severe  **This list is not all possible symptoms. Contact your medical provider for any symptoms that are sever or concerning to you.   MAKE SURE YOU   Understand these instructions.  Will watch your condition.  Will get help right away if you are not doing well or get worse.  Your e-visit answers were reviewed by a board certified advanced clinical practitioner to complete your personal care plan.  Depending on the condition, your plan could have included both over the counter or prescription medications.  If there is a problem please reply once you have received a response from your provider.  Your safety is important to [place].  If you have drug allergies check your prescription carefully.    You can use MyChart to ask questions about today's visit, request a non-urgent call back, or ask for a work or school excuse for 24 hours related to this e-Visit. If it has been greater than 24 hours you will need to follow up with your provider, or enter a new e-Visit to address those concerns. You will get an e-mail in the next two days asking about your experience.  I hope that your e-visit has been valuable and will speed your recovery. Thank you for using e-visits.   5-10 minutes spent reviewing and documenting in chart.

## 2019-08-02 ENCOUNTER — Other Ambulatory Visit: Payer: Self-pay

## 2019-08-02 DIAGNOSIS — Z20822 Contact with and (suspected) exposure to covid-19: Secondary | ICD-10-CM

## 2019-08-02 NOTE — Progress Notes (Signed)
lab

## 2019-08-04 LAB — NOVEL CORONAVIRUS, NAA: SARS-CoV-2, NAA: NOT DETECTED

## 2019-08-25 ENCOUNTER — Encounter (HOSPITAL_COMMUNITY): Payer: Self-pay | Admitting: Emergency Medicine

## 2019-08-25 ENCOUNTER — Other Ambulatory Visit: Payer: Self-pay

## 2019-08-25 ENCOUNTER — Ambulatory Visit (HOSPITAL_COMMUNITY)
Admission: EM | Admit: 2019-08-25 | Discharge: 2019-08-25 | Disposition: A | Discharge status: Home / Self Care | Payer: Managed Care, Other (non HMO) | Attending: Internal Medicine | Admitting: Internal Medicine

## 2019-08-25 DIAGNOSIS — U071 COVID-19: Secondary | ICD-10-CM | POA: Diagnosis present

## 2019-08-25 DIAGNOSIS — B349 Viral infection, unspecified: Secondary | ICD-10-CM

## 2019-08-25 DIAGNOSIS — Z79899 Other long term (current) drug therapy: Secondary | ICD-10-CM | POA: Insufficient documentation

## 2019-08-25 NOTE — ED Notes (Signed)
POCT Urinalysis dipstick cancelled by Dr. Lanny Cramp. TB

## 2019-08-25 NOTE — ED Provider Notes (Signed)
[place]    CSN: [id number] Arrival date & time: 08/25/19  1414      History   Chief Complaint Chief Complaint  Patient presents with  . Fever    HPI Mitchell Quinn is a 28 y.o. male with no past medical history comes to urgent care with complaints of runny nose, sore throat cough and a fever of 102.5 Fahrenheit.  Patient symptoms have been ongoing for the past couple of days.  Cough is not productive of sputum.  He denies any shortness of breath.  He has been having alternating constipation and diarrhea over the past several days.  Is positive history of sick contacts in the family with similar symptoms.  Patient works in [employer].  No abdominal distention.  No vomiting.  Appetite is preserved.  HPI  Past Medical History:  Diagnosis Date  . IBS (irritable bowel syndrome)   . Pneumonia     Patient Active Problem List   Diagnosis Date Noted  . IRRITABLE BOWEL SYNDROME 10/23/2010  . FOLLICULITIS 40/98/1191  . ABDOMINAL PAIN 10/03/2010  . DIARRHEA 09/20/2010  . Sprain of ankle, unspecified site 03/26/2010    Past Surgical History:  Procedure Laterality Date  . ORIF WRIST FRACTURE         Home Medications    Prior to Admission medications   Medication Sig Start Date End Date Taking? Authorizing Provider  dicyclomine (BENTYL) 20 MG tablet Take 1 tablet (20 mg total) by mouth 2 (two) times daily. 07/25/19  Yes Yu, Amy V, PA-C  acetaminophen (TYLENOL) 500 MG tablet Take 1,000 mg by mouth every 6 (six) hours as needed for headache.    [provider]  albuterol (PROVENTIL HFA;VENTOLIN HFA) 108 (90 Base) MCG/ACT inhaler Inhale 2 puffs into the lungs every 4 (four) hours as needed for wheezing or shortness of breath. 01/25/19   Harrie Foreman, MD  albuterol (VENTOLIN HFA) 108 (90 Base) MCG/ACT inhaler Inhale 2 puffs into the lungs every 6 (six) hours as needed for wheezing or shortness of breath. 07/30/19   Hassell Done, Mary-Margaret, FNP   atomoxetine (STRATTERA) 40 MG capsule Take 40 mg by mouth daily.    [provider]  benzonatate (TESSALON PERLES) 100 MG capsule Take 1 capsule (100 mg total) by mouth 3 (three) times daily as needed. 07/30/19   Hassell Done, Mary-Margaret, FNP  hydrocortisone (ANUSOL-HC) 25 MG suppository Place 1 suppository (25 mg total) rectally 2 (two) times daily. 07/25/19   Ok Edwards, PA-C  nystatin cream (MYCOSTATIN) Apply to affected area 2 times daily 07/25/19   Tasia Catchings, Amy V, PA-C  ondansetron (ZOFRAN-ODT) 4 MG disintegrating tablet Take 1 tablet (4 mg total) by mouth every 8 (eight) hours as needed for nausea or vomiting. 07/09/18   Vanessa Kick, MD    Family History Family History  Problem Relation Age of Onset  . Healthy Mother   . Healthy Father     Social History Social History   Tobacco Use  . Smoking status: Never Smoker  . Smokeless tobacco: Current User    Types: Snuff  Substance Use Topics  . Alcohol use: No  . Drug use: Yes    Comment: occas     Allergies   Patient has no known allergies.   Review of Systems Review of Systems  Constitutional: Negative.   HENT: Positive for congestion, postnasal drip, rhinorrhea and sore throat. Negative for ear discharge, ear pain, facial swelling, hearing loss, mouth sores, nosebleeds, sinus pressure, sinus  pain and trouble swallowing.   Eyes: Negative.   Respiratory: Negative for cough, shortness of breath and wheezing.   Cardiovascular: Negative.   Gastrointestinal: Positive for abdominal pain. Negative for nausea and vomiting.  Genitourinary: Negative for dysuria, frequency and urgency.  Musculoskeletal: Negative.   Skin: Negative.   Neurological: Positive for headaches. Negative for dizziness and weakness.     Physical Exam Triage Vital Signs ED Triage Vitals  Enc Vitals Group     BP 08/25/19 1454 124/84     Pulse Rate 08/25/19 1454 (!) 109     Resp 08/25/19 1454 16     Temp 08/25/19 1454 (!) 102.4 F (39.1 C)     Temp  Source 08/25/19 1454 Oral     SpO2 08/25/19 1454 97 %     Weight --      Height --      Head Circumference --      Peak Flow --      Pain Score 08/25/19 1455 7     Pain Loc --      Pain Edu? --      Excl. in GC? --    No data found.  Updated Vital Signs BP 124/84   Pulse (!) 109   Temp (!) 102.4 F (39.1 C) (Oral)   Resp 16   SpO2 97%   Visual Acuity Right Eye Distance:   Left Eye Distance:   Bilateral Distance:    Right Eye Near:   Left Eye Near:    Bilateral Near:     Physical Exam HENT:     Right Ear: Tympanic membrane normal.     Left Ear: Tympanic membrane normal.     Nose: Congestion and rhinorrhea present.     Mouth/Throat:     Mouth: Mucous membranes are moist.     Pharynx: No posterior oropharyngeal erythema.  Cardiovascular:     Rate and Rhythm: Normal rate and regular rhythm.  Pulmonary:     Effort: Pulmonary effort is normal.     Breath sounds: Normal breath sounds.  Skin:    General: Skin is warm.     Findings: No bruising, lesion or rash.      UC Treatments / Results  Labs (all labs ordered are listed, but only abnormal results are displayed) Labs Reviewed  NOVEL CORONAVIRUS, NAA (HOSP ORDER, SEND-OUT TO REF LAB; TAT 18-24 HRS)    EKG   Radiology No results found.  Procedures Procedures (including critical care time)  Medications Ordered in UC Medications - No data to display  Initial Impression / Assessment and Plan / UC Course  I have reviewed the triage vital signs and the nursing notes.  Pertinent labs & imaging results that were available during my care of the patient were reviewed by me and considered in my medical decision making (see chart for details).     1.  Upper respiratory infection symptoms: COVID-19 testing done Supportive care Tylenol/Motrin over-the-counter for fever or body aches Patient is advised to self isolate until the COVID-19 test results are available.  Patient is advised to return to urgent care  if his symptoms get worse or if he starts developing shortness of breath with fever or change in mental status. Final Clinical Impressions(s) / UC Diagnoses   Final diagnoses:  Viral illness   Discharge Instructions   None    ED Prescriptions    None     Controlled Substance Prescriptions [place] Controlled Substance Registry consulted? No   Darely Becknell,  Britta MccreedyPhilip O, MD 08/26/19 1750

## 2019-08-25 NOTE — ED Triage Notes (Signed)
Fever, head pressure, diarrhea, abdominal pain. PT is having alternating diarrhea and hard stool.   Also having hemmorhoid issues and bleeding.

## 2019-08-27 ENCOUNTER — Telehealth (HOSPITAL_COMMUNITY): Payer: Self-pay | Admitting: Emergency Medicine

## 2019-08-27 LAB — NOVEL CORONAVIRUS, NAA (HOSP ORDER, SEND-OUT TO REF LAB; TAT 18-24 HRS): SARS-CoV-2, NAA: DETECTED — AB

## 2019-08-27 NOTE — Telephone Encounter (Signed)
Patient contacted and made aware of  covid  results, all questions answered  

## 2019-08-29 ENCOUNTER — Emergency Department (HOSPITAL_COMMUNITY)
Admission: EM | Admit: 2019-08-29 | Discharge: 2019-08-29 | Disposition: A | Discharge status: Home / Self Care | Payer: Managed Care, Other (non HMO) | Attending: Emergency Medicine | Admitting: Emergency Medicine

## 2019-08-29 ENCOUNTER — Encounter (HOSPITAL_COMMUNITY): Payer: Self-pay | Admitting: Emergency Medicine

## 2019-08-29 ENCOUNTER — Emergency Department (HOSPITAL_COMMUNITY): Payer: Managed Care, Other (non HMO)

## 2019-08-29 ENCOUNTER — Other Ambulatory Visit: Payer: Self-pay

## 2019-08-29 DIAGNOSIS — R42 Dizziness and giddiness: Secondary | ICD-10-CM | POA: Insufficient documentation

## 2019-08-29 DIAGNOSIS — R55 Syncope and collapse: Secondary | ICD-10-CM

## 2019-08-29 DIAGNOSIS — Z79899 Other long term (current) drug therapy: Secondary | ICD-10-CM | POA: Diagnosis not present

## 2019-08-29 DIAGNOSIS — R509 Fever, unspecified: Secondary | ICD-10-CM | POA: Diagnosis present

## 2019-08-29 DIAGNOSIS — U071 COVID-19: Secondary | ICD-10-CM | POA: Diagnosis not present

## 2019-08-29 DIAGNOSIS — Z720 Tobacco use: Secondary | ICD-10-CM | POA: Insufficient documentation

## 2019-08-29 LAB — COMPREHENSIVE METABOLIC PANEL
ALT: 32 U/L (ref 0–44)
AST: 36 U/L (ref 15–41)
Albumin: 4.2 g/dL (ref 3.5–5.0)
Alkaline Phosphatase: 67 U/L (ref 38–126)
Anion gap: 9 (ref 5–15)
BUN: 9 mg/dL (ref 6–20)
CO2: 27 mmol/L (ref 22–32)
Calcium: 8.5 mg/dL — ABNORMAL LOW (ref 8.9–10.3)
Chloride: 101 mmol/L (ref 98–111)
Creatinine, Ser: 1.02 mg/dL (ref 0.61–1.24)
GFR calc Af Amer: >60 mL/min (ref >60)
GFR calc non Af Amer: >60 mL/min (ref >60)
Glucose, Bld: 101 mg/dL — ABNORMAL HIGH (ref 70–99)
Potassium: 3.8 mmol/L (ref 3.5–5.1)
Sodium: 137 mmol/L (ref 135–145)
Total Bilirubin: 0.4 mg/dL (ref 0.3–1.2)
Total Protein: 7.9 g/dL (ref 6.5–8.1)

## 2019-08-29 LAB — CBC WITH DIFFERENTIAL/PLATELET
Abs Immature Granulocytes: 0.05 10*3/uL (ref 0.00–0.07)
Basophils Absolute: 0 10*3/uL (ref 0.0–0.1)
Basophils Relative: 0 %
Eosinophils Absolute: 0 10*3/uL (ref 0.0–0.5)
Eosinophils Relative: 0 %
HCT: 41 % (ref 39.0–52.0)
Hemoglobin: 13.9 g/dL (ref 13.0–17.0)
Immature Granulocytes: 1 %
Lymphocytes Relative: 17 %
Lymphs Abs: 1.1 10*3/uL (ref 0.7–4.0)
MCH: 31.2 pg (ref 26.0–34.0)
MCHC: 33.9 g/dL (ref 30.0–36.0)
MCV: 92.1 fL (ref 80.0–100.0)
Monocytes Absolute: 0.3 10*3/uL (ref 0.1–1.0)
Monocytes Relative: 4 %
Neutro Abs: 5.2 10*3/uL (ref 1.7–7.7)
Neutrophils Relative %: 78 %
Platelets: 175 10*3/uL (ref 150–400)
RBC: 4.45 MIL/uL (ref 4.22–5.81)
RDW: 12.4 % (ref 11.5–15.5)
WBC: 6.6 10*3/uL (ref 4.0–10.5)
nRBC: 0 % (ref 0.0–0.2)

## 2019-08-29 LAB — LIPASE, BLOOD: Lipase: 38 U/L (ref 11–51)

## 2019-08-29 MED ORDER — FAMOTIDINE IN NACL 20-0.9 MG/50ML-% IV SOLN
20.0000 mg | Freq: Once | INTRAVENOUS | Status: AC
  Administered 2019-08-29: 17:00:00 20 mg via INTRAVENOUS
  Filled 2019-08-29: qty 50

## 2019-08-29 MED ORDER — ONDANSETRON 4 MG PO TBDP: 4.0000 mg | ORAL_TABLET | Freq: Three times a day (TID) | ORAL | 0 refills | Status: DC | PRN

## 2019-08-29 MED ORDER — ACETAMINOPHEN 500 MG PO TABS
1000.0000 mg | ORAL_TABLET | Freq: Once | ORAL | Status: AC
  Administered 2019-08-29: 1000 mg via ORAL
  Filled 2019-08-29: qty 2

## 2019-08-29 MED ORDER — IBUPROFEN 800 MG PO TABS
800.0000 mg | ORAL_TABLET | Freq: Once | ORAL | Status: AC
  Administered 2019-08-29: 800 mg via ORAL
  Filled 2019-08-29: qty 1

## 2019-08-29 MED ORDER — FAMOTIDINE 20 MG PO TABS: 20.0000 mg | ORAL_TABLET | Freq: Two times a day (BID) | ORAL | 0 refills | Status: DC

## 2019-08-29 MED ORDER — ONDANSETRON HCL 4 MG/2ML IJ SOLN
4.0000 mg | Freq: Once | INTRAMUSCULAR | Status: AC
  Administered 2019-08-29: 17:00:00 4 mg via INTRAVENOUS
  Filled 2019-08-29: qty 2

## 2019-08-29 MED ORDER — AZITHROMYCIN 250 MG PO TABS: 250.0000 mg | ORAL_TABLET | Freq: Every day | ORAL | 0 refills | Status: DC

## 2019-08-29 MED ORDER — DEXAMETHASONE SODIUM PHOSPHATE 10 MG/ML IJ SOLN
10.0000 mg | Freq: Once | INTRAMUSCULAR | Status: AC
  Administered 2019-08-29: 17:00:00 10 mg via INTRAVENOUS
  Filled 2019-08-29: qty 1

## 2019-08-29 MED ORDER — ALBUTEROL SULFATE HFA 108 (90 BASE) MCG/ACT IN AERS
2.0000 | INHALATION_SPRAY | Freq: Once | RESPIRATORY_TRACT | Status: AC
  Administered 2019-08-29: 17:00:00 2 via RESPIRATORY_TRACT
  Filled 2019-08-29: qty 6.7

## 2019-08-29 MED ORDER — SODIUM CHLORIDE 0.9 % IV BOLUS
1000.0000 mL | Freq: Once | INTRAVENOUS | Status: AC
  Administered 2019-08-29: 18:00:00 1000 mL via INTRAVENOUS

## 2019-08-29 NOTE — Discharge Instructions (Addendum)
You are seen in the emergency department today after passing out with respiratory and GI symptoms and known COVID-19.  Your lab work was overall reassuring. Your EKG did not show any significant abnormalities. Your chest x-ray showed findings of a mildly enlarged heart which should be rechecked by her primary care provider as well as an infiltrate/pneumonia.  We are starting you on azithromycin, an antibiotic, to treat the pneumonia, please take this as prescribed.  We are also sending you home with Zofran to take every 8 hours as needed for nausea and vomiting, Pepcid to take twice per day as needed for stomach upset, and inhaler to use every 4-6 hours 2 puffs as needed for trouble breathing/wheezing.  We have prescribed you new medication(s) today. Discuss the medications prescribed today with your pharmacist as they can have adverse effects and interactions with your other medicines including over the counter and prescribed medications. Seek medical evaluation if you start to experience new or abnormal symptoms after taking one of these medicines, seek care immediately if you start to experience difficulty breathing, feeling of your throat closing, facial swelling, or rash as these could be indications of a more serious allergic reaction  Please continue to take Motrin/Tylenol at home for over-the-counter dosing to help with fevers  You are instructing patients with coronavirus quarantine themselves for 14 days. You may be able to discontinue self quarantine if the following conditions are met:   Persons with COVID-19 who have symptoms and were directed to care for themselves at home may discontinue home isolation under the  following conditions: - It has been at least 7 days have passed since symptoms first appeared. - AND at least 3 days (72 hours) have passed since recovery defined as resolution of fever without the use of fever-reducing medications and improvement in respiratory symptoms  (e.g., cough, shortness of breath)  Please follow the below quarantine instructions.   Please follow up with primary care within 3-5 days for re-evaluation- call prior to going to the office to make them aware of your symptoms. Return to the ER for new or worsening symptoms including but not limited to increased work of breathing, fever, chest pain, inability to keep fluids down, or any other concerns.       Person Under Monitoring Name: Mitchell Quinn  Location: [place] [address]   Infection Prevention Recommendations for Individuals Confirmed to have, or Being Evaluated for, 2019 Novel Coronavirus (COVID-19) Infection Who Receive Care at Home  Individuals who are confirmed to have, or are being evaluated for, COVID-19 should follow the prevention steps below until a healthcare provider or local or state health department says they can return to normal activities.  Stay home except to get medical care You should restrict activities outside your home, except for getting medical care. Do not go to work, school, or public areas, and do not use public transportation or taxis.  Call ahead before visiting your doctor Before your medical appointment, call the healthcare provider and tell them that you have, or are being evaluated for, COVID-19 infection. This will help the healthcare providers office take steps to keep other people from getting infected. Ask your healthcare provider to call the local or state health department.  Monitor your symptoms Seek prompt medical attention if your illness is worsening (e.g., difficulty breathing). Before going to your medical appointment, call the healthcare provider and tell them that you have, or are being evaluated for, COVID-19 infection. Ask your healthcare provider  to call the local or state health department.  Wear a facemask You should wear a facemask that covers your nose and mouth when you are in the same room  with other people and when you visit a healthcare provider. People who live with or visit you should also wear a facemask while they are in the same room with you.  Separate yourself from other people in your home As much as possible, you should stay in a different room from other people in your home. Also, you should use a separate bathroom, if available.  Avoid sharing household items You should not share dishes, drinking glasses, cups, eating utensils, towels, bedding, or other items with other people in your home. After using these items, you should wash them thoroughly with soap and water.  Cover your coughs and sneezes Cover your mouth and nose with a tissue when you cough or sneeze, or you can cough or sneeze into your sleeve. Throw used tissues in a lined trash can, and immediately wash your hands with soap and water for at least 20 seconds or use an alcohol-based hand rub.  Wash your [facility] your hands often and thoroughly with soap and water for at least 20 seconds. You can use an alcohol-based hand sanitizer if soap and water are not available and if your hands are not visibly dirty. Avoid touching your eyes, nose, and mouth with unwashed hands.   Prevention Steps for Caregivers and Household Members of Individuals Confirmed to have, or Being Evaluated for, COVID-19 Infection Being Cared for in the Home  If you live with, or provide care at home for, a person confirmed to have, or being evaluated for, COVID-19 infection please follow these guidelines to prevent infection:  Follow healthcare providers instructions Make sure that you understand and can help the patient follow any healthcare provider instructions for all care.  Provide for the patients basic needs You should help the patient with basic needs in the home and provide support for getting groceries, prescriptions, and other personal needs.  Monitor the patients symptoms If they are getting sicker, call  his or her medical provider and tell them that the patient has, or is being evaluated for, COVID-19 infection. This will help the healthcare providers office take steps to keep other people from getting infected. Ask the healthcare provider to call the local or state health department.  Limit the number of people who have contact with the patient If possible, have only one caregiver for the patient. Other household members should stay in another home or place of residence. If this is not possible, they should stay in another room, or be separated from the patient as much as possible. Use a separate bathroom, if available. Restrict visitors who do not have an essential need to be in the home.  Keep older adults, very young children, and other sick people away from the patient Keep older adults, very young children, and those who have compromised immune systems or chronic health conditions away from the patient. This includes people with chronic heart, lung, or kidney conditions, diabetes, and cancer.  Ensure good ventilation Make sure that shared spaces in the home have good air flow, such as from an air conditioner or an opened window, weather permitting.  Wash your hands often Wash your hands often and thoroughly with soap and water for at least 20 seconds. You can use an alcohol based hand sanitizer if soap and water are not available and if your hands are  not visibly dirty. Avoid touching your eyes, nose, and mouth with unwashed hands. Use disposable paper towels to dry your hands. If not available, use dedicated cloth towels and replace them when they become wet.  Wear a facemask and gloves Wear a disposable facemask at all times in the room and gloves when you touch or have contact with the patients blood, body fluids, and/or secretions or excretions, such as sweat, saliva, sputum, nasal mucus, vomit, urine, or feces.  Ensure the mask fits over your nose and mouth tightly, and do not  touch it during use. Throw out disposable facemasks and gloves after using them. Do not reuse. Wash your hands immediately after removing your facemask and gloves. If your personal clothing becomes contaminated, carefully remove clothing and launder. Wash your hands after handling contaminated clothing. Place all used disposable facemasks, gloves, and other waste in a lined container before disposing them with other household waste. Remove gloves and wash your hands immediately after handling these items.  Do not share dishes, glasses, or other household items with the patient Avoid sharing household items. You should not share dishes, drinking glasses, cups, eating utensils, towels, bedding, or other items with a patient who is confirmed to have, or being evaluated for, COVID-19 infection. After the person uses these items, you should wash them thoroughly with soap and water.  Wash laundry thoroughly Immediately remove and wash clothes or bedding that have blood, body fluids, and/or secretions or excretions, such as sweat, saliva, sputum, nasal mucus, vomit, urine, or feces, on them. Wear gloves when handling laundry from the patient. Read and follow directions on labels of laundry or clothing items and detergent. In general, wash and dry with the warmest temperatures recommended on the label.  Clean all areas the individual has used often Clean all touchable surfaces, such as counters, tabletops, doorknobs, bathroom fixtures, toilets, phones, keyboards, tablets, and bedside tables, every day. Also, clean any surfaces that may have blood, body fluids, and/or secretions or excretions on them. Wear gloves when cleaning surfaces the patient has come in contact with. Use a diluted bleach solution (e.g., dilute bleach with 1 part bleach and 10 parts water) or a household disinfectant with a label that says EPA-registered for coronaviruses. To make a bleach solution at home, add 1 tablespoon of bleach  to 1 quart (4 cups) of water. For a larger supply, add  cup of bleach to 1 gallon (16 cups) of water. Read labels of cleaning products and follow recommendations provided on product labels. Labels contain instructions for safe and effective use of the cleaning product including precautions you should take when applying the product, such as wearing gloves or eye protection and making sure you have good ventilation during use of the product. Remove gloves and wash hands immediately after cleaning.  Monitor yourself for signs and symptoms of illness Caregivers and household members are considered close contacts, should monitor their health, and will be asked to limit movement outside of the home to the extent possible. Follow the monitoring steps for close contacts listed on the symptom monitoring form.   ? If you have additional questions, contact your local health department or call the epidemiologist on call at [phone] (available 24/7). ? This guidance is subject to change. For the most up-to-date guidance from [facility], please refer to their website: [email or web address]

## 2019-08-29 NOTE — ED Provider Notes (Signed)
[facility] EMERGENCY DEPARTMENT Provider Note   CSN: [id number] Arrival date & time: 08/29/19  1531     History   Chief Complaint Chief Complaint  Patient presents with  . Fever    Covid Positive    HPI Mitchell Quinn is a 28 y.o. male with a hx of IBS who presents to the ED w/ complaints of fever x 5 days. Patient states sxs began w/ fever & generalized body aches, he saw PCP who tested him for COVID which resulted positive. Since then he feels he has been unable to get his fever to break, body aches have worsened, & he is having dry cough, dyspnea (worse with exertion), nausea, vomiting, abdominal discomfort, & poor appetite. He has been treating sxs w/ ibuprofen/tylenol- last doses of each was 0900 this AM. He states that today he transitioned from sitting to standing had increased nausea with lightheadedness & had brief syncope episode. No head injury occurred. No chest pain or increased dyspnea prior to syncope. He states with how much worse he was feeling he felt he needed to come get checked out. His wife has also tested positive for covid. Denies hematemesis, melena, hematochezia, chest pain, or unilateral leg pain/swelling.      HPI  Past Medical History:  Diagnosis Date  . IBS (irritable bowel syndrome)   . Pneumonia     Patient Active Problem List   Diagnosis Date Noted  . IRRITABLE BOWEL SYNDROME 10/23/2010  . FOLLICULITIS 29/92/4268  . ABDOMINAL PAIN 10/03/2010  . DIARRHEA 09/20/2010  . Sprain of ankle, unspecified site 03/26/2010    Past Surgical History:  Procedure Laterality Date  . ORIF WRIST FRACTURE          Home Medications    Prior to Admission medications   Medication Sig Start Date End Date Taking? Authorizing Provider  acetaminophen (TYLENOL) 500 MG tablet Take 1,000 mg by mouth every 6 (six) hours as needed for headache.    [provider]  albuterol (PROVENTIL HFA;VENTOLIN HFA) 108 (90 Base) MCG/ACT inhaler Inhale 2 puffs into  the lungs every 4 (four) hours as needed for wheezing or shortness of breath. 01/25/19   Harrie Foreman, MD  albuterol (VENTOLIN HFA) 108 (90 Base) MCG/ACT inhaler Inhale 2 puffs into the lungs every 6 (six) hours as needed for wheezing or shortness of breath. 07/30/19   Hassell Done, Mary-Margaret, FNP  atomoxetine (STRATTERA) 40 MG capsule Take 40 mg by mouth daily.    [provider]  benzonatate (TESSALON PERLES) 100 MG capsule Take 1 capsule (100 mg total) by mouth 3 (three) times daily as needed. 07/30/19   Hassell Done, Mary-Margaret, FNP  dicyclomine (BENTYL) 20 MG tablet Take 1 tablet (20 mg total) by mouth 2 (two) times daily. 07/25/19   Ok Edwards, PA-C  hydrocortisone (ANUSOL-HC) 25 MG suppository Place 1 suppository (25 mg total) rectally 2 (two) times daily. 07/25/19   Ok Edwards, PA-C  nystatin cream (MYCOSTATIN) Apply to affected area 2 times daily 07/25/19   Tasia Catchings, Amy V, PA-C  ondansetron (ZOFRAN-ODT) 4 MG disintegrating tablet Take 1 tablet (4 mg total) by mouth every 8 (eight) hours as needed for nausea or vomiting. 07/09/18   Vanessa Kick, MD    Family History Family History  Problem Relation Age of Onset  . Healthy Mother   . Healthy Father     Social History Social History   Tobacco Use  . Smoking status: Never Smoker  . Smokeless tobacco: Current User  Types: Snuff  Substance Use Topics  . Alcohol use: No  . Drug use: Never     Allergies   Patient has no known allergies.   Review of Systems Review of Systems  Constitutional: Positive for appetite change, chills and fever.  HENT: Positive for congestion.   Respiratory: Positive for cough and shortness of breath.   Cardiovascular: Negative for chest pain.  Gastrointestinal: Positive for abdominal pain, nausea and vomiting. Negative for blood in stool and diarrhea.  Genitourinary: Negative for dysuria.  Neurological: Positive for syncope, weakness (generalized) and light-headedness.  All other systems reviewed  and are negative.    Physical Exam Updated Vital Signs BP 107/71 (BP Location: Right Arm)   Pulse (!) 105   Temp (!) 101.9 F (38.8 C) (Oral)   Resp 20   Ht 6' (1.829 m)   Wt 115.7 kg   SpO2 93%   BMI 34.58 kg/m   Physical Exam Vitals signs and nursing note reviewed.  Constitutional:      General: He is not in acute distress.    Appearance: He is well-developed. He is not toxic-appearing.  HENT:     Head: Normocephalic and atraumatic.     Nose: Congestion present.     Mouth/Throat:     Mouth: Mucous membranes are dry.     Pharynx: Uvula midline. No oropharyngeal exudate or posterior oropharyngeal erythema.  Eyes:     General:        Right eye: No discharge.        Left eye: No discharge.     Extraocular Movements: Extraocular movements intact.     Conjunctiva/sclera: Conjunctivae normal.     Pupils: Pupils are equal, round, and reactive to light.  Neck:     Musculoskeletal: Normal range of motion and neck supple. No neck rigidity.  Cardiovascular:     Rate and Rhythm: Normal rate and regular rhythm.  Pulmonary:     Effort: Pulmonary effort is normal. No respiratory distress.     Breath sounds: Normal breath sounds. No wheezing, rhonchi or rales.     Comments: SpO2 93-98% on RA. I personally ambulated the patient throughout his exam room and SpO2 maintained at 96% without significant tachypnea.  Abdominal:     General: There is no distension.     Palpations: Abdomen is soft.     Tenderness: There is no abdominal tenderness. There is no right CVA tenderness, left CVA tenderness, guarding or rebound.  Musculoskeletal:        General: No tenderness.     Right lower leg: No edema.     Left lower leg: No edema.  Skin:    General: Skin is warm and dry.     Findings: No rash.  Neurological:     General: No focal deficit present.     Mental Status: He is alert.     Comments: Clear speech.   Psychiatric:        Behavior: Behavior normal.    ED Treatments / Results   Labs (all labs ordered are listed, but only abnormal results are displayed) Labs Reviewed  COMPREHENSIVE METABOLIC PANEL - Abnormal; Notable for the following components:      Result Value   Glucose, Bld 101 (*)    Calcium 8.5 (*)    All other components within normal limits  LIPASE, BLOOD  CBC WITH DIFFERENTIAL/PLATELET    EKG EKG Interpretation  Date/Time:  Sunday August 29 2019 16:58:41 EDT Ventricular Rate:  95 PR Interval:  QRS Duration: 96 QT Interval:  336 QTC Calculation: 423 R Axis:   57 Text Interpretation:  Sinus rhythm Consider left atrial enlargement Confirmed by Donnetta Hutchingook, Brian ([id number]) on 08/29/2019 6:39:19 PM   Radiology Dg Chest Portable 1 View  Result Date: 08/29/2019 CLINICAL DATA:  Cough.  Dyspnea. EXAM: PORTABLE CHEST 1 VIEW COMPARISON:  May 01, 2019 FINDINGS: There is an airspace opacity at the right lung base, new since prior x-ray in May 2020. The heart size remains mildly enlarged. There is no pneumothorax. No large pleural effusion. No acute osseous abnormality. IMPRESSION: 1. Airspace opacity at the right lung base concerning for infiltrate secondary to viral or bacterial pneumonia. 2. Borderline cardiomegaly. Electronically Signed   By: Katherine Mantlehristopher  Green M.D.   On: 08/29/2019 16:57    Procedures Procedures (including critical care time)  Medications Ordered in ED Medications - No data to display   Initial Impression / Assessment and Plan / ED Course  I have reviewed the triage vital signs and the nursing notes.  Pertinent labs & imaging results that were available during my care of the patient were reviewed by me and considered in my medical decision making (see chart for details).   Patient w/ confirmed covid 19 presents w/ worsening fever, GI/respiratory sxs, & syncope episode this AM. He is nontoxic appearing, noted to be febrile on arrival. On exam he does not appear to be in respiratory distress, lungs are clear, abdomen soft & nontender, no  focal neuro deficits. He has had overall poor PO intake and did become a bit lightheaded when he stood up to ambulate- suspect his syncopal episodes was dehydration related w/ orthostatic hypotension- will administer fluids. Will proceed w/ labs, EKG, & CXR. Medications ordered to assist w/ sxs as well.   CBC: No leukocytosis/anemia.  CMP: Mild hypocalcemia, no significant electrolyte derangement. LFTs & renal function preserved.  Lipase: WNL EKG/cardiac monitor: No significant arrhythmias.  CXR:  1. Airspace opacity at the right lung base concerning for infiltrate secondary to viral or bacterial pneumonia. 2. Borderline cardiomegaly.--> will tx w/ azithromycin.   18:15: Fever worsened, will administer ibuprofen. PO challenge.   Blood pressure 108/71, pulse 95, temperature 99.1 F (37.3 C), temperature source Oral, resp. rate 18, height 6' (1.829 m), weight 115.7 kg, SpO2 94 %.  19:40: RE-EVAL: Patient feeling improved, tolerating PO, temperature has normalized, he is overall feeling improved.  Repeat abdominal exam remains without peritoneal signs, doubt acute surgical abdomen.  Lungs remain clear, ambulatory SPO2 94 to 96%, does not appear to require hospitalization for hypoxia related to his COVID-19/pneumonia.  Will treat the infiltrate on his chest x-ray with azithromycin.  Again feel that his syncopal episode was related to dehydration with poor p.o. intake, encourage oral fluids at home, he has received 2 L of fluids in the emergency department.  Will discharge home with additional supportive measures including Zofran, Pepcid, and albuterol inhaler which was provided in the ER.  Discussed quarantine.  PCP follow-up. I discussed results, treatment plan, need for follow-up, and return precautions with the patient. Provided opportunity for questions, patient confirmed understanding and is in agreement with plan.   Findings and plan of care discussed with supervising physician Dr. Adriana Simasook who is in  agreement.   Final Clinical Impressions(s) / ED Diagnoses   Final diagnoses:  COVID-19  Syncope, unspecified syncope type    ED Discharge Orders         Ordered    azithromycin (ZITHROMAX) 250 MG tablet  Daily  08/29/19 1949    ondansetron (ZOFRAN ODT) 4 MG disintegrating tablet  Every 8 hours PRN     08/29/19 1949    famotidine (PEPCID) 20 MG tablet  2 times daily     08/29/19 1949           Desmond Lopeetrucelli,  R, PA-C 08/29/19 2004    Donnetta Hutchingook, Brian, MD 09/01/19 1447

## 2019-08-29 NOTE — ED Triage Notes (Signed)
Pt reports having positive covid test on 08/27/19 and has been feeling really bad since. Has fever, cough, body aches, had syncopal episode this morning, and has not been able to get fever to break. Also vomiting today. Last Tylenol this morning.

## 2019-08-29 NOTE — ED Notes (Signed)
Pt tolerating oral fluids 

## 2019-08-29 NOTE — ED Notes (Signed)
Pt verbalized understanding of DC instructions

## 2019-09-02 ENCOUNTER — Inpatient Hospital Stay (HOSPITAL_COMMUNITY)
Admission: EM | Admit: 2019-09-02 | Discharge: 2019-09-06 | DRG: 177 | Disposition: A | Discharge status: Home / Self Care | Payer: Managed Care, Other (non HMO) | Attending: Internal Medicine | Admitting: Internal Medicine

## 2019-09-02 ENCOUNTER — Emergency Department (HOSPITAL_COMMUNITY): Payer: Managed Care, Other (non HMO)

## 2019-09-02 ENCOUNTER — Other Ambulatory Visit: Payer: Self-pay

## 2019-09-02 ENCOUNTER — Encounter (HOSPITAL_COMMUNITY): Payer: Self-pay

## 2019-09-02 DIAGNOSIS — Z720 Tobacco use: Secondary | ICD-10-CM | POA: Diagnosis not present

## 2019-09-02 DIAGNOSIS — R0602 Shortness of breath: Secondary | ICD-10-CM | POA: Diagnosis present

## 2019-09-02 DIAGNOSIS — R0902 Hypoxemia: Secondary | ICD-10-CM

## 2019-09-02 DIAGNOSIS — U071 COVID-19: Principal | ICD-10-CM | POA: Diagnosis present

## 2019-09-02 DIAGNOSIS — J1289 Other viral pneumonia: Secondary | ICD-10-CM | POA: Diagnosis present

## 2019-09-02 DIAGNOSIS — R55 Syncope and collapse: Secondary | ICD-10-CM | POA: Diagnosis present

## 2019-09-02 DIAGNOSIS — J069 Acute upper respiratory infection, unspecified: Secondary | ICD-10-CM | POA: Diagnosis not present

## 2019-09-02 DIAGNOSIS — Z791 Long term (current) use of non-steroidal anti-inflammatories (NSAID): Secondary | ICD-10-CM

## 2019-09-02 DIAGNOSIS — J1282 Pneumonia due to coronavirus disease 2019: Secondary | ICD-10-CM

## 2019-09-02 DIAGNOSIS — J9601 Acute respiratory failure with hypoxia: Secondary | ICD-10-CM | POA: Diagnosis present

## 2019-09-02 DIAGNOSIS — Z79899 Other long term (current) drug therapy: Secondary | ICD-10-CM | POA: Diagnosis not present

## 2019-09-02 DIAGNOSIS — J9600 Acute respiratory failure, unspecified whether with hypoxia or hypercapnia: Secondary | ICD-10-CM | POA: Diagnosis present

## 2019-09-02 LAB — CBC WITH DIFFERENTIAL/PLATELET
Abs Immature Granulocytes: 0.56 10*3/uL — ABNORMAL HIGH (ref 0.00–0.07)
Basophils Absolute: 0 10*3/uL (ref 0.0–0.1)
Basophils Relative: 0 %
Eosinophils Absolute: 0 10*3/uL (ref 0.0–0.5)
Eosinophils Relative: 0 %
HCT: 40.2 % (ref 39.0–52.0)
Hemoglobin: 13.6 g/dL (ref 13.0–17.0)
Immature Granulocytes: 6 %
Lymphocytes Relative: 14 %
Lymphs Abs: 1.4 10*3/uL (ref 0.7–4.0)
MCH: 30.7 pg (ref 26.0–34.0)
MCHC: 33.8 g/dL (ref 30.0–36.0)
MCV: 90.7 fL (ref 80.0–100.0)
Monocytes Absolute: 0.6 10*3/uL (ref 0.1–1.0)
Monocytes Relative: 6 %
Neutro Abs: 7.5 10*3/uL (ref 1.7–7.7)
Neutrophils Relative %: 74 %
Platelets: 281 10*3/uL (ref 150–400)
RBC: 4.43 MIL/uL (ref 4.22–5.81)
RDW: 12.3 % (ref 11.5–15.5)
WBC: 10 10*3/uL (ref 4.0–10.5)
nRBC: 0 % (ref 0.0–0.2)

## 2019-09-02 LAB — FERRITIN: Ferritin: 1072 ng/mL — ABNORMAL HIGH (ref 24–336)

## 2019-09-02 LAB — TRIGLYCERIDES: Triglycerides: 139 mg/dL (ref <150)

## 2019-09-02 LAB — LACTIC ACID, PLASMA: Lactic Acid, Venous: 1 mmol/L (ref 0.5–1.9)

## 2019-09-02 LAB — COMPREHENSIVE METABOLIC PANEL
ALT: 29 U/L (ref 0–44)
AST: 32 U/L (ref 15–41)
Albumin: 3.5 g/dL (ref 3.5–5.0)
Alkaline Phosphatase: 63 U/L (ref 38–126)
Anion gap: 12 (ref 5–15)
BUN: 14 mg/dL (ref 6–20)
CO2: 24 mmol/L (ref 22–32)
Calcium: 8.6 mg/dL — ABNORMAL LOW (ref 8.9–10.3)
Chloride: 100 mmol/L (ref 98–111)
Creatinine, Ser: 0.88 mg/dL (ref 0.61–1.24)
GFR calc Af Amer: >60 mL/min (ref >60)
GFR calc non Af Amer: >60 mL/min (ref >60)
Glucose, Bld: 103 mg/dL — ABNORMAL HIGH (ref 70–99)
Potassium: 3.7 mmol/L (ref 3.5–5.1)
Sodium: 136 mmol/L (ref 135–145)
Total Bilirubin: 0.7 mg/dL (ref 0.3–1.2)
Total Protein: 7.4 g/dL (ref 6.5–8.1)

## 2019-09-02 LAB — C-REACTIVE PROTEIN: CRP: 16.4 mg/dL — ABNORMAL HIGH (ref <1.0)

## 2019-09-02 LAB — FIBRINOGEN: Fibrinogen: 736 mg/dL — ABNORMAL HIGH (ref 210–475)

## 2019-09-02 LAB — PROCALCITONIN: Procalcitonin: <0.1 ng/mL

## 2019-09-02 LAB — LACTATE DEHYDROGENASE: LDH: 279 U/L — ABNORMAL HIGH (ref 98–192)

## 2019-09-02 LAB — D-DIMER, QUANTITATIVE: D-Dimer, Quant: 1.15 ug/mL-FEU — ABNORMAL HIGH (ref 0.00–0.50)

## 2019-09-02 LAB — TROPONIN I (HIGH SENSITIVITY): Troponin I (High Sensitivity): 5 ng/L (ref <18)

## 2019-09-02 MED ORDER — FOLIC ACID 1 MG PO TABS
1.0000 mg | ORAL_TABLET | Freq: Every day | ORAL | Status: DC
  Administered 2019-09-02 – 2019-09-06 (×5): 1 mg via ORAL
  Filled 2019-09-02 (×5): qty 1

## 2019-09-02 MED ORDER — DICYCLOMINE HCL 20 MG PO TABS: 20.0000 mg | ORAL_TABLET | Freq: Two times a day (BID) | ORAL | Status: DC

## 2019-09-02 MED ORDER — METHYLPREDNISOLONE SODIUM SUCC 125 MG IJ SOLR
125.0000 mg | Freq: Once | INTRAMUSCULAR | Status: AC
  Administered 2019-09-02: 125 mg via INTRAVENOUS
  Filled 2019-09-02: qty 2

## 2019-09-02 MED ORDER — LOPERAMIDE HCL 2 MG PO CAPS: 2.0000 mg | ORAL_CAPSULE | Freq: Four times a day (QID) | ORAL | Status: DC | PRN

## 2019-09-02 MED ORDER — VITAMIN B-1 100 MG PO TABS
100.0000 mg | ORAL_TABLET | Freq: Every day | ORAL | Status: DC
  Administered 2019-09-02 – 2019-09-06 (×5): 100 mg via ORAL
  Filled 2019-09-02 (×5): qty 1

## 2019-09-02 MED ORDER — ENOXAPARIN SODIUM 60 MG/0.6ML ~~LOC~~ SOLN
55.0000 mg | SUBCUTANEOUS | Status: DC
  Administered 2019-09-02 – 2019-09-05 (×4): 55 mg via SUBCUTANEOUS
  Filled 2019-09-02 (×4): qty 0.6

## 2019-09-02 MED ORDER — SODIUM CHLORIDE 0.9 % IV SOLN
1000.0000 mL | INTRAVENOUS | Status: DC
  Administered 2019-09-02: 1000 mL via INTRAVENOUS

## 2019-09-02 MED ORDER — SODIUM CHLORIDE 0.9 % IV SOLN
200.0000 mg | Freq: Once | INTRAVENOUS | Status: AC
  Administered 2019-09-02: 200 mg via INTRAVENOUS
  Filled 2019-09-02: qty 40

## 2019-09-02 MED ORDER — ENOXAPARIN SODIUM 60 MG/0.6ML ~~LOC~~ SOLN: 60.0000 mg | SUBCUTANEOUS | Status: DC

## 2019-09-02 MED ORDER — FAMOTIDINE 20 MG PO TABS
20.0000 mg | ORAL_TABLET | Freq: Two times a day (BID) | ORAL | Status: DC
  Administered 2019-09-02 – 2019-09-06 (×8): 20 mg via ORAL
  Filled 2019-09-02 (×8): qty 1

## 2019-09-02 MED ORDER — ALBUTEROL SULFATE HFA 108 (90 BASE) MCG/ACT IN AERS
2.0000 | INHALATION_SPRAY | RESPIRATORY_TRACT | Status: AC
  Administered 2019-09-02: 2 via RESPIRATORY_TRACT
  Filled 2019-09-02: qty 6.7

## 2019-09-02 MED ORDER — GUAIFENESIN ER 600 MG PO TB12
600.0000 mg | ORAL_TABLET | Freq: Two times a day (BID) | ORAL | Status: DC
  Administered 2019-09-02 – 2019-09-06 (×9): 600 mg via ORAL
  Filled 2019-09-02 (×8): qty 1

## 2019-09-02 MED ORDER — ONDANSETRON 4 MG PO TBDP: 4.0000 mg | ORAL_TABLET | Freq: Three times a day (TID) | ORAL | Status: DC | PRN

## 2019-09-02 MED ORDER — SODIUM CHLORIDE 0.9 % IV SOLN
1000.0000 mL | INTRAVENOUS | Status: DC
  Administered 2019-09-02: 12:00:00 1000 mL via INTRAVENOUS

## 2019-09-02 MED ORDER — VITAMIN C 500 MG PO TABS
500.0000 mg | ORAL_TABLET | Freq: Every day | ORAL | Status: DC
  Administered 2019-09-02 – 2019-09-06 (×5): 500 mg via ORAL
  Filled 2019-09-02 (×5): qty 1

## 2019-09-02 MED ORDER — ALBUTEROL SULFATE HFA 108 (90 BASE) MCG/ACT IN AERS
2.0000 | INHALATION_SPRAY | RESPIRATORY_TRACT | Status: DC | PRN
  Filled 2019-09-02: qty 6.7

## 2019-09-02 MED ORDER — ZINC SULFATE 220 (50 ZN) MG PO CAPS
220.0000 mg | ORAL_CAPSULE | Freq: Every day | ORAL | Status: DC
  Administered 2019-09-02 – 2019-09-06 (×5): 220 mg via ORAL
  Filled 2019-09-02 (×5): qty 1

## 2019-09-02 MED ORDER — METHYLPREDNISOLONE SODIUM SUCC 125 MG IJ SOLR: 60.0000 mg | Freq: Two times a day (BID) | INTRAMUSCULAR | Status: DC

## 2019-09-02 MED ORDER — SODIUM CHLORIDE 0.9 % IV SOLN
100.0000 mg | INTRAVENOUS | Status: DC
  Administered 2019-09-03 – 2019-09-05 (×3): 100 mg via INTRAVENOUS
  Filled 2019-09-02 (×3): qty 20

## 2019-09-02 MED ORDER — ALBUTEROL SULFATE HFA 108 (90 BASE) MCG/ACT IN AERS
2.0000 | INHALATION_SPRAY | Freq: Four times a day (QID) | RESPIRATORY_TRACT | Status: DC
  Administered 2019-09-02 – 2019-09-06 (×14): 2 via RESPIRATORY_TRACT
  Filled 2019-09-02: qty 6.7

## 2019-09-02 MED ORDER — METHYLPREDNISOLONE SODIUM SUCC 125 MG IJ SOLR
60.0000 mg | Freq: Two times a day (BID) | INTRAMUSCULAR | Status: DC
  Administered 2019-09-02 – 2019-09-04 (×4): 60 mg via INTRAVENOUS
  Filled 2019-09-02 (×4): qty 2

## 2019-09-02 MED ORDER — ADULT MULTIVITAMIN W/MINERALS CH
1.0000 | ORAL_TABLET | Freq: Every day | ORAL | Status: DC
  Administered 2019-09-02 – 2019-09-06 (×5): 1 via ORAL
  Filled 2019-09-02 (×5): qty 1

## 2019-09-02 MED ORDER — BENZONATATE 100 MG PO CAPS: 100.0000 mg | ORAL_CAPSULE | Freq: Three times a day (TID) | ORAL | Status: DC

## 2019-09-02 NOTE — ED Triage Notes (Signed)
Pt reports diagnosed last Wednesday with covid.  Reports was here Sunday and was diagnosed with bilateral pneumonia.  Today reports sob worse and c/o pain in r lung.

## 2019-09-02 NOTE — ED Provider Notes (Signed)
[facility] EMERGENCY DEPARTMENT Provider Note   CSN: [id number] Arrival date & time: 09/02/19  1039     History   Chief Complaint Chief Complaint  Patient presents with  . covid  . Shortness of Breath    HPI Mitchell Quinn is a 28 y.o. male.     HPI  This patient is a 28 year old male, he has a known history of irritable bowel syndrome but no other significant chronic medical problems.  He reports that he recently developed symptoms consistent with coronavirus and was tested, in fact his test came back positive and he was seen in the emergency department on August 30, at that time his oxygen level was 94% or better, his fever had come down and he was doing much better however over the last couple of days he has developed increasing chest discomfort, shortness of breath, severe fatigue and weakness.  There is no nausea vomiting or diarrhea but he is continuing to cough which seems to be getting worse, the pain in his chest is right of sternum.  He denies swelling of the legs vomiting or diarrhea.  Found to have oxygen levels of 87% on arrival.  No prior significant lung disease.  Past Medical History:  Diagnosis Date  . IBS (irritable bowel syndrome)   . Pneumonia     Patient Active Problem List   Diagnosis Date Noted  . Acute respiratory disease due to COVID-19 virus 09/02/2019  . IRRITABLE BOWEL SYNDROME 10/23/2010  . FOLLICULITIS 10/23/2010  . ABDOMINAL PAIN 10/03/2010  . DIARRHEA 09/20/2010  . Sprain of ankle, unspecified site 03/26/2010    Past Surgical History:  Procedure Laterality Date  . ORIF WRIST FRACTURE          Home Medications    Prior to Admission medications   Medication Sig Start Date End Date Taking? Authorizing Provider  albuterol (VENTOLIN HFA) 108 (90 Base) MCG/ACT inhaler Inhale 2 puffs into the lungs every 6 (six) hours as needed for wheezing or shortness of breath. 07/30/19  Yes Daphine DeutscherMartin, Mary-Margaret, FNP  azithromycin (ZITHROMAX) 250 MG  tablet Take 1 tablet (250 mg total) by mouth daily. Take first 2 tablets together, then 1 every day until finished. 08/29/19  Yes Petrucelli, Samantha R, PA-C  famotidine (PEPCID) 20 MG tablet Take 1 tablet (20 mg total) by mouth 2 (two) times daily. 08/29/19  Yes Petrucelli, Samantha R, PA-C  ibuprofen (ADVIL) 400 MG tablet Take 400 mg by mouth every 6 (six) hours as needed.   Yes [provider]  ondansetron (ZOFRAN ODT) 4 MG disintegrating tablet Take 1 tablet (4 mg total) by mouth every 8 (eight) hours as needed for nausea or vomiting. 08/29/19  Yes Petrucelli, Samantha R, PA-C  albuterol (PROVENTIL HFA;VENTOLIN HFA) 108 (90 Base) MCG/ACT inhaler Inhale 2 puffs into the lungs every 4 (four) hours as needed for wheezing or shortness of breath. Patient not taking: Reported on 08/29/2019 01/25/19   Arnaldo Nataliamond, Michael S, MD  benzonatate (TESSALON PERLES) 100 MG capsule Take 1 capsule (100 mg total) by mouth 3 (three) times daily as needed. Patient not taking: Reported on 08/29/2019 07/30/19   Bennie PieriniMartin, Mary-Margaret, FNP  dicyclomine (BENTYL) 20 MG tablet Take 1 tablet (20 mg total) by mouth 2 (two) times daily. Patient not taking: Reported on 08/29/2019 07/25/19   Belinda FisherYu, Amy V, PA-C  hydrocortisone (ANUSOL-HC) 25 MG suppository Place 1 suppository (25 mg total) rectally 2 (two) times daily. Patient not taking: Reported on 08/29/2019 07/25/19   Linward HeadlandYu, Amy  V, PA-C    Family History Family History  Problem Relation Age of Onset  . Healthy Mother   . Healthy Father     Social History Social History   Tobacco Use  . Smoking status: Never Smoker  . Smokeless tobacco: Current User    Types: Snuff  Substance Use Topics  . Alcohol use: No  . Drug use: Never     Allergies   Patient has no known allergies.   Review of Systems Review of Systems  All other systems reviewed and are negative.    Physical Exam Updated Vital Signs BP 127/82   Pulse 80   Temp 99.2 F (37.3 C) (Oral)   Resp (!)  21   Ht 1.829 m (6')   Wt 52.2 kg   SpO2 97%   BMI 15.60 kg/m   Physical Exam Vitals signs and nursing note reviewed.  Constitutional:      General: He is in acute distress.     Appearance: He is well-developed. He is ill-appearing and diaphoretic.  HENT:     Head: Normocephalic and atraumatic.     Mouth/Throat:     Pharynx: No oropharyngeal exudate.  Eyes:     General: No scleral icterus.       Right eye: No discharge.        Left eye: No discharge.     Conjunctiva/sclera: Conjunctivae normal.     Pupils: Pupils are equal, round, and reactive to light.  Neck:     Musculoskeletal: Normal range of motion and neck supple.     Thyroid: No thyromegaly.     Vascular: No JVD.  Cardiovascular:     Rate and Rhythm: Normal rate and regular rhythm.     Heart sounds: Normal heart sounds. No murmur. No friction rub. No gallop.   Pulmonary:     Effort: Tachypnea and respiratory distress present.     Breath sounds: Examination of the right-middle field reveals rales. Examination of the left-middle field reveals rales. Examination of the right-lower field reveals rales. Examination of the left-lower field reveals rales. Rales present. No wheezing.  Abdominal:     General: Bowel sounds are normal. There is no distension.     Palpations: Abdomen is soft. There is no mass.     Tenderness: There is no abdominal tenderness.  Musculoskeletal: Normal range of motion.        General: No tenderness.  Lymphadenopathy:     Cervical: No cervical adenopathy.  Skin:    General: Skin is warm.     Findings: No erythema or rash.  Neurological:     Mental Status: He is alert.     Coordination: Coordination normal.  Psychiatric:        Behavior: Behavior normal.      ED Treatments / Results  Labs (all labs ordered are listed, but only abnormal results are displayed) Labs Reviewed  CBC WITH DIFFERENTIAL/PLATELET - Abnormal; Notable for the following components:      Result Value   Abs Immature  Granulocytes 0.56 (*)    All other components within normal limits  COMPREHENSIVE METABOLIC PANEL - Abnormal; Notable for the following components:   Glucose, Bld 103 (*)    Calcium 8.6 (*)    All other components within normal limits  D-DIMER, QUANTITATIVE (NOT AT [facility]) - Abnormal; Notable for the following components:   D-Dimer, Quant 1.15 (*)    All other components within normal limits  LACTATE DEHYDROGENASE - Abnormal; Notable for the  following components:   LDH 279 (*)    All other components within normal limits  FERRITIN - Abnormal; Notable for the following components:   Ferritin 1,072 (*)    All other components within normal limits  FIBRINOGEN - Abnormal; Notable for the following components:   Fibrinogen 736 (*)    All other components within normal limits  C-REACTIVE PROTEIN - Abnormal; Notable for the following components:   CRP 16.4 (*)    All other components within normal limits  CULTURE, BLOOD (ROUTINE X 2)  CULTURE, BLOOD (ROUTINE X 2)  LACTIC ACID, PLASMA  PROCALCITONIN  TRIGLYCERIDES  TROPONIN I (HIGH SENSITIVITY)    EKG None  Radiology Dg Chest Port 1 View  Result Date: 09/02/2019 CLINICAL DATA:  Cough and shortness of breath. Known COVID-19 infection. EXAM: PORTABLE CHEST 1 VIEW COMPARISON:  August 29, 2019 FINDINGS: Cardiomediastinal silhouette is normal. Mediastinal contours appear intact. There is no evidence of pleural effusion or pneumothorax. Bilateral patchy airspace consolidation with lower lobe predominance. Low lung volumes. Osseous structures are without acute abnormality. Soft tissues are grossly normal. IMPRESSION: 1. Bilateral patchy airspace consolidation with lower lobe predominance, consistent with atypical multifocal pneumonia. 2. Low lung volumes. Electronically Signed   By: Fidela Salisbury M.D.   On: 09/02/2019 12:26    Procedures .Critical Care Performed by: Noemi Chapel, MD Authorized by: Noemi Chapel, MD   Critical care  provider statement:    Critical care time (minutes):  35   Critical care time was exclusive of:  Separately billable procedures and treating other patients and teaching time   Critical care was necessary to treat or prevent imminent or life-threatening deterioration of the following conditions:  Respiratory failure   Critical care was time spent personally by me on the following activities:  Blood draw for specimens, development of treatment plan with patient or surrogate, discussions with consultants, evaluation of patient's response to treatment, examination of patient, obtaining history from patient or surrogate, ordering and performing treatments and interventions, ordering and review of laboratory studies, ordering and review of radiographic studies, pulse oximetry, re-evaluation of patient's condition and review of old charts   (including critical care time)  Medications Ordered in ED Medications  0.9 %  sodium chloride infusion (0 mLs Intravenous Stopped 09/02/19 1240)  methylPREDNISolone sodium succinate (SOLU-MEDROL) 125 mg/2 mL injection 125 mg (125 mg Intravenous Given 09/02/19 1311)  albuterol (VENTOLIN HFA) 108 (90 Base) MCG/ACT inhaler 2 puff (2 puffs Inhalation Given 09/02/19 1311)     Initial Impression / Assessment and Plan / ED Course  I have reviewed the triage vital signs and the nursing notes.  Pertinent labs & imaging results that were available during my care of the patient were reviewed by me and considered in my medical decision making (see chart for details).  Clinical Course as of Sep 02 1339  Thu Sep 02, 2019  1212 I have personally viewed the patient's x-ray, there does appear to be multifocal infiltrates today including both bases.  Compared to yesterday where there was just more of a right-sided infiltrate.  This is abnormal and progressive.  The patient is requiring 2 L of oxygen by nasal cannula, labs are pending at this time   [BM]    Clinical Course User Index  [BM] Noemi Chapel, MD       The patient is having increasing chest pain with shortness of breath and now has rales in his bilateral lung fields which would be consistent with a progressive pneumonia  likely from COVID.  We will obtain the COVID labs as well as a chest x-ray and an EKG.  His oxygen level ranges between 85 and 90%.  [facility].  He is not hypotensive nor is he febrile at this time.  Heart rate ranges between 80 and 90  I have reviewed the medical record which shows that the patient had an airspace opacity at the right lung base thought to be secondary to pneumonia, he had been prescribed Zofran, Pepcid and Zithromax four days ago during his visit   D/w hospitalist who will admit [facility] and pneumonia  Final Clinical Impressions(s) / ED Diagnoses   Final diagnoses:  Pneumonia due to COVID-19 virus  Hypoxia    ED Discharge Orders    None       Eber HongMiller, Mikinzie Maciejewski, MD 09/02/19 1340

## 2019-09-02 NOTE — H&P (Signed)
Patient Demographics:    Mitchell Quinn, is a 28 y.o. male  MRN: [id number]   DOB - 10/15/1991  Admit Date - 09/02/2019  [facility]   Assessment & Plan:    Principal Problem:   Acute respiratory disease due to COVID-19 virus Active Problems:   Pneumonia due to COVID-19 virus   Respiratory Hypoxic failure, acute -Covid Related   1)Acute hypoxic respiratory failure secondary to COVID-19 infection/pneumonia--- The treatment plan and use of medications  for treatment of COVID-19 infection and possible side effects were discussed with patient,  explained that there is No proven definitive treatment for COVID-19 infection, any medications used here are based on published clinical articles/anecdotal data which at times and not yet peer-reviewed or randomized control trials. Complete risks and long-term side effects are unknown, however in the best clinical judgment they seem to be of some clinical benefit . --potential side effects of Remdesivir including, but not limited to allergic reaction, nausea, vomiting, elevated LFTs discussed with patient /family ,also discussed potential steroid side effect including elevated blood sugars, elevated blood pressure, psychosis/anxiety,  insomnia --Patient verbalizes understanding and agrees to treatment protocols   --Patient is positive for COVID-19 infection, chest x-ray with findings of infiltrates/opacities,  patient is hypoxic and requiring continuous supplemental oxygen---patient meets criteria for initiation of Remdesivir AND Steroid therapy per protocol  --Check and trend fibrinogen, CRP, pro calcitonin, CBC, BMP, d-dimer, LDH, ferritin and LFTs --Supplemental oxygen to keep O2 sats above 93% -Follow serial chest x-rays and ABGs  as indicated --- Encourage prone positioning for More than 16 hours/day in increments of 2 to 3 hours at a time if able to tolerate --Attempt to maintain euvolemic state --Zinc and vitamin C as ordered -Albuterol inhaler as needed --- [facility] -Pharmacy [facility]  2)Vomiting and Diarrhea----suspect to be related, gentle hydration, goal is to maintain euvolemia -Check and replace electrolytes -PRN Imodium -PRN Zofran   With History of - Reviewed by me  Past Medical History:  Diagnosis Date  . IBS (irritable bowel syndrome)   . Pneumonia       Past Surgical History:  Procedure Laterality Date  . ORIF WRIST FRACTURE        Chief Complaint  Patient presents with  . covid  . Shortness of Breath      HPI:    Mitchell Quinn  is a 28 y.o. male without significant past medical history who was seen in urgent care on 08/25/2019 with fevers above 102 and respiratory symptoms--- he was tested for COVID at the time, was found to be positive for COVID-19 on 08/25/2019 test --Patient's wife is also positive for COVID-19 infection  --Patient does continue to have fevers, cough, shortness of breath, sore throat, runny nose, vomiting or diarrhea -Emesis is without blood or bile, diarrhea is watery without mucus or  blood -Cough is wet but not very productive --Patient had episodes of near syncope after vomiting and coughing  --Repeat imaging study in ED suggest bilateral multifocal pneumonia suggestive of COVID pneumonia--- compared to chest x-ray from 08/29/2019 findings have worsened --He was started on azithromycin apparently on 08/29/2019  --Found to be hypoxic with O2 sats in the 85 to 87 % range  Lactic acid is 1.0 -CBC is unremarkable with WBC of 10.0 -CMP is mostly unremarkable, creatinine 0.88, LFTs are not elevated - blood cultures pending -Procalcitonin <  0.10, triglycerides are 139, troponin is 5  --LDH is elevated at 279, ferritin is elevated at 1072, fibrinogen is elevated at 736, CRP is elevated at 16.4, d-dimer is elevated at 1.15  --Patient received IV steroids, bronchodilators and supplemental oxygen   Review of systems:    In addition to the HPI above,   A full Review of  Systems was done, all other systems reviewed are negative except as noted above in HPI , .    Social History:  Reviewed by me   Social History   Tobacco Use  . Smoking status: Never Smoker  . Smokeless tobacco: Current User    Types: Snuff  Substance Use Topics  . Alcohol use: No     Family History :  Reviewed by me   Family History  Problem Relation Age of Onset  . Healthy Mother   . Healthy Father      Home Medications:   Prior to Admission medications   Medication Sig Start Date End Date Taking? Authorizing Provider  albuterol (VENTOLIN HFA) 108 (90 Base) MCG/ACT inhaler Inhale 2 puffs into the lungs every 6 (six) hours as needed for wheezing or shortness of breath. 07/30/19  Yes Hassell Done, Mary-Margaret, FNP  azithromycin (ZITHROMAX) 250 MG tablet Take 1 tablet (250 mg total) by mouth daily. Take first 2 tablets together, then 1 every day until finished. 08/29/19  Yes Petrucelli, Samantha R, PA-C  famotidine (PEPCID) 20 MG tablet Take 1 tablet (20 mg total) by mouth 2 (two) times daily. 08/29/19  Yes Petrucelli, Samantha R, PA-C  ibuprofen (ADVIL) 400 MG tablet Take 400 mg by mouth every 6 (six) hours as needed.   Yes [provider]  ondansetron (ZOFRAN ODT) 4 MG disintegrating tablet Take 1 tablet (4 mg total) by mouth every 8 (eight) hours as needed for nausea or vomiting. 08/29/19  Yes Petrucelli, Samantha R, PA-C  albuterol (PROVENTIL HFA;VENTOLIN HFA) 108 (90 Base) MCG/ACT inhaler Inhale 2 puffs into the lungs every 4 (four) hours as needed for wheezing or shortness of breath. Patient not taking: Reported on 08/29/2019 01/25/19   Harrie Foreman, MD  benzonatate (TESSALON  PERLES) 100 MG capsule Take 1 capsule (100 mg total) by mouth 3 (three) times daily as needed. Patient not taking: Reported on 08/29/2019 07/30/19   Chevis Pretty, FNP  dicyclomine (BENTYL) 20 MG tablet Take 1 tablet (20 mg total) by mouth 2 (two) times daily. Patient not taking: Reported on 08/29/2019 07/25/19   Ok Edwards, PA-C  hydrocortisone (ANUSOL-HC) 25 MG suppository Place 1 suppository (25 mg total) rectally 2 (two) times daily. Patient not taking: Reported on 08/29/2019 07/25/19   Ok Edwards, PA-C     Allergies:    No Known Allergies   Physical Exam:   Vitals  Blood pressure 131/83, pulse 82, temperature 98.6 F (37 C), temperature source Oral, resp. rate (!) 21, height 6' (1.829 m), weight 108.9 kg, SpO2 94 %.  Temp:  [98.6 F (37 C)-99.2 F (37.3 C)] 98.6 F (37 C) (09/03 1554) Pulse Rate:  [77-94] 82 (09/03 1620) Resp:  [18-31] 21 (09/03 1620) BP: (92-140)/(61-116) 131/83 (09/03 1620) SpO2:  [87 %-100 %] 94 % (09/03 1620) FiO2 (%):  [97 %] 97 % (09/03 1147) Weight:  [52.2 kg-108.9 kg] 108.9 kg (09/03 1542)  Physical Examination: General appearance - alert, ill appearing,  Mental status - alert, oriented to person, place, and time,  Eyes - sclera anicteric Nose- Earling 2L/min Neck - supple, no JVD elevation , Chest -Diminished breath sounds bilaterally, scattered rhonchi  Heart - S1 and S2 normal, regular  Abdomen - soft, nontender, nondistended, no masses or organomegaly Neurological - screening mental status exam normal, neck supple without rigidity, cranial nerves II through XII intact, DTR's normal and symmetric Extremities - no pedal edema noted, intact peripheral pulses  Skin - warm, dry   Data Review:    CBC Recent Labs  Lab 08/29/19 1633 09/02/19 1128  WBC 6.6 10.0  HGB 13.9 13.6  HCT 41.0 40.2  PLT 175 281  MCV 92.1 90.7  MCH 31.2 30.7  MCHC 33.9 33.8  RDW 12.4 12.3  LYMPHSABS 1.1 1.4  MONOABS 0.3 0.6  EOSABS 0.0 0.0  BASOSABS 0.0 0.0    Chemistries  Recent Labs  Lab 08/29/19 1633 09/02/19 1128  NA 137 136  K 3.8 3.7  CL 101 100  CO2 27 24  GLUCOSE 101* 103*  BUN 9 14  CREATININE 1.02 0.88  CALCIUM 8.5* 8.6*  AST 36 32  ALT 32 29  ALKPHOS 67 63  BILITOT 0.4 0.7   ------------------------------------------------------------------------------------------------------------------ estimated creatinine clearance is 156.4 mL/min (by C-G formula based on SCr of 0.88 mg/dL). ------------------------------------------------------------------------------------------------------------------ No results for input(s): TSH, T4TOTAL, T3FREE, THYROIDAB in the last 72 hours.  Invalid input(s): FREET3   Coagulation profile No results for input(s): INR, PROTIME in the last 168 hours. ------------------------------------------------------------------------------------------------------------------- Recent Labs    09/02/19 1128  DDIMER 1.15*   Cardiac Enzymes No results for input(s): CKMB, TROPONINI, MYOGLOBIN in the last 168 hours.  Invalid input(s): CK ------------------------------------------------------------------------------------------------------------------ No results found for: BNP  Urinalysis    Component Value Date/Time   LABSPEC 1.020 02/01/2016 1953   PHURINE 7.0 02/01/2016 1953   GLUCOSEU NEGATIVE 02/01/2016 1953   HGBUR NEGATIVE 02/01/2016 1953   BILIRUBINUR NEGATIVE 02/01/2016 1953   KETONESUR NEGATIVE 02/01/2016 1953   PROTEINUR NEGATIVE 02/01/2016 1953   UROBILINOGEN 0.2 02/01/2016 1953   NITRITE NEGATIVE 02/01/2016 1953   LEUKOCYTESUR NEGATIVE 02/01/2016 1953   ----------------------------------------------------------------------------------------------------------------   Imaging Results:    Dg Chest Port 1 View  Result Date: 09/02/2019 CLINICAL DATA:  Cough and shortness of breath. Known COVID-19 infection. EXAM: PORTABLE CHEST 1 VIEW COMPARISON:  August 29, 2019 FINDINGS:  Cardiomediastinal silhouette is normal. Mediastinal contours appear intact. There is no evidence of pleural effusion or pneumothorax. Bilateral patchy airspace consolidation with lower lobe predominance. Low lung volumes. Osseous structures are without acute abnormality. Soft tissues are grossly normal. IMPRESSION: 1. Bilateral patchy airspace consolidation with lower lobe predominance, consistent with atypical multifocal pneumonia. 2. Low lung volumes. Electronically Signed   By: Ted Mcalpine M.D.   On: 09/02/2019 12:26   Radiological Exams on Admission: Dg Chest Port 1 View  Result Date: 09/02/2019 CLINICAL DATA:  Cough and shortness of breath. Known COVID-19 infection. EXAM: PORTABLE CHEST 1 VIEW COMPARISON:  August 29, 2019 FINDINGS: Cardiomediastinal silhouette is normal. Mediastinal contours appear intact. There is no evidence of  pleural effusion or pneumothorax. Bilateral patchy airspace consolidation with lower lobe predominance. Low lung volumes. Osseous structures are without acute abnormality. Soft tissues are grossly normal. IMPRESSION: 1. Bilateral patchy airspace consolidation with lower lobe predominance, consistent with atypical multifocal pneumonia. 2. Low lung volumes. Electronically Signed   By: Ted Mcalpineobrinka  Dimitrova M.D.   On: 09/02/2019 12:26   DVT Prophylaxis -SCD/Lovenox 0.5 mg sq daily AM Labs Ordered, also please review Full Orders  Family Communication: Admission, patients condition and plan of care including tests being ordered have been discussed with the patient  who indicate understanding and agree with the plan   Code Status - Full Code  [facility]  Condition   Stable  Shon Haleourage Amaria Mundorf M.D on 09/02/2019 at 4:50 PM Go to [email or web address] -  for contact info  [facility] - Office  [phone]

## 2019-09-02 NOTE — Progress Notes (Signed)
Family dropped off items for patient. This RN gave items to primary RN to place in patient's room.

## 2019-09-02 NOTE — Progress Notes (Addendum)
Report given to nurse that came in at 2300 to take over care of patient.  HS meds given to patient and assessment to 3rd shift nurse.    Message left for patient's wife on her only number.  Patient stated that he has talked to her a couple times since he has been here and he did not think she had any questions.  Message left said she could call anytime for updates.  Patient is in no acute distress presently.

## 2019-09-03 LAB — CBC WITH DIFFERENTIAL/PLATELET
Abs Immature Granulocytes: 0.59 10*3/uL — ABNORMAL HIGH (ref 0.00–0.07)
Basophils Absolute: 0.1 10*3/uL (ref 0.0–0.1)
Basophils Relative: 2 %
Eosinophils Absolute: 0 10*3/uL (ref 0.0–0.5)
Eosinophils Relative: 0 %
HCT: 38.5 % — ABNORMAL LOW (ref 39.0–52.0)
Hemoglobin: 13.3 g/dL (ref 13.0–17.0)
Immature Granulocytes: 9 %
Lymphocytes Relative: 18 %
Lymphs Abs: 1.2 10*3/uL (ref 0.7–4.0)
MCH: 31 pg (ref 26.0–34.0)
MCHC: 34.5 g/dL (ref 30.0–36.0)
MCV: 89.7 fL (ref 80.0–100.0)
Monocytes Absolute: 0.1 10*3/uL (ref 0.1–1.0)
Monocytes Relative: 1 %
Neutro Abs: 4.5 10*3/uL (ref 1.7–7.7)
Neutrophils Relative %: 70 %
Platelets: 325 10*3/uL (ref 150–400)
RBC: 4.29 MIL/uL (ref 4.22–5.81)
RDW: 12.1 % (ref 11.5–15.5)
WBC: 6.5 10*3/uL (ref 4.0–10.5)
nRBC: 0 % (ref 0.0–0.2)

## 2019-09-03 LAB — D-DIMER, QUANTITATIVE: D-Dimer, Quant: 0.67 ug/mL-FEU — ABNORMAL HIGH (ref 0.00–0.50)

## 2019-09-03 LAB — COMPREHENSIVE METABOLIC PANEL
ALT: 33 U/L (ref 0–44)
AST: 30 U/L (ref 15–41)
Albumin: 3.6 g/dL (ref 3.5–5.0)
Alkaline Phosphatase: 65 U/L (ref 38–126)
Anion gap: 10 (ref 5–15)
BUN: 14 mg/dL (ref 6–20)
CO2: 25 mmol/L (ref 22–32)
Calcium: 8.7 mg/dL — ABNORMAL LOW (ref 8.9–10.3)
Chloride: 103 mmol/L (ref 98–111)
Creatinine, Ser: 0.52 mg/dL — ABNORMAL LOW (ref 0.61–1.24)
GFR calc Af Amer: >60 mL/min (ref >60)
GFR calc non Af Amer: >60 mL/min (ref >60)
Glucose, Bld: 141 mg/dL — ABNORMAL HIGH (ref 70–99)
Potassium: 3.8 mmol/L (ref 3.5–5.1)
Sodium: 138 mmol/L (ref 135–145)
Total Bilirubin: 1 mg/dL (ref 0.3–1.2)
Total Protein: 7.6 g/dL (ref 6.5–8.1)

## 2019-09-03 LAB — FERRITIN: Ferritin: 784 ng/mL — ABNORMAL HIGH (ref 24–336)

## 2019-09-03 LAB — LACTATE DEHYDROGENASE: LDH: 262 U/L — ABNORMAL HIGH (ref 98–192)

## 2019-09-03 LAB — ABO/RH: ABO/RH(D): O POS

## 2019-09-03 LAB — C-REACTIVE PROTEIN: CRP: 12.4 mg/dL — ABNORMAL HIGH (ref <1.0)

## 2019-09-03 LAB — BRAIN NATRIURETIC PEPTIDE: B Natriuretic Peptide: 19.8 pg/mL (ref 0.0–100.0)

## 2019-09-03 LAB — MAGNESIUM: Magnesium: 2.7 mg/dL — ABNORMAL HIGH (ref 1.7–2.4)

## 2019-09-03 NOTE — Progress Notes (Signed)
Attempted to call patient wife for update but no answer. Did not leave message.

## 2019-09-03 NOTE — TOC Initial Note (Signed)
Transition of Care [facility]) - Initial/Assessment Note    Patient Details  Name: Mitchell Quinn MRN: [id number] Date of Birth: October 11, 1991  [facility] CM/SW Contact:    Ninfa Meeker, RN Phone Number: [phone] (working remotely) 09/03/2019, 9:50 AM  Clinical Narrative:    28 yr old gentleman admitted from home, COVID+. Case manager will continue to monitor for any discharge needs as he medically improves. May he be blessed to do so.                     Patient Goals and CMS Choice        Expected Discharge Plan and Services           Expected Discharge Date: 09/06/19                                    Prior Living Arrangements/Services                       Activities of Daily Living Home Assistive Devices/Equipment: None ADL Screening (condition at time of admission) Patient's cognitive ability adequate to safely complete daily activities?: Yes Is the patient deaf or have difficulty hearing?: No Does the patient have difficulty seeing, even when wearing glasses/contacts?: No Does the patient have difficulty concentrating, remembering, or making decisions?: No Patient able to express need for assistance with ADLs?: Yes Does the patient have difficulty dressing or bathing?: No Independently performs ADLs?: Yes (appropriate for developmental age) Does the patient have difficulty walking or climbing stairs?: No Weakness of Legs: None Weakness of Arms/Hands: None  Permission Sought/Granted                  Emotional Assessment              Admission diagnosis:  Hypoxia [R09.02] Pneumonia due to COVID-19 virus [U07.1, J12.89] Patient Active Problem List   Diagnosis Date Noted  . Acute respiratory disease due to COVID-19 virus 09/02/2019  . Pneumonia due to COVID-19 virus 09/02/2019  . Respiratory Hypoxic failure, acute -Covid Related 09/02/2019  . IRRITABLE BOWEL SYNDROME 10/23/2010  . FOLLICULITIS 26/94/8546  .  ABDOMINAL PAIN 10/03/2010  . DIARRHEA 09/20/2010  . Sprain of ankle, unspecified site 03/26/2010   PCP:  [facility] [place]:   [address], [address] AT [address] [place] [address] Phone: [phone] Fax: [phone]  [id number] - [place], [address] AT [place] [address] [address] Phone: [phone] Fax: [phone]  [place], [place] - [address] AT [facility] [address] [place] [address] Phone: [phone] Fax: [phone]  [facility] [phone] - [place], [place] AT [place] [address] [place] [address] Phone: [phone] Fax: [phone]     Social Determinants of Health (SDOH) Interventions    Readmission Risk Interventions No flowsheet data found.

## 2019-09-03 NOTE — Progress Notes (Signed)
PROGRESS NOTE                                                                                                                                                                                                             Patient Demographics:    Mitchell Quinn, is a 28 y.o. male, DOB - 1991/10/17, TDH:[id number]  Outpatient Primary [facility] for the [facility] is [facility] [place] date - 09/02/2019    Chief Complaint  Patient presents with  . covid  . Shortness of Breath       Brief Narrative   Mitchell Quinn  is a 28 y.o. male without significant past medical history who was seen in urgent care on 08/25/2019 with fevers above 102 and respiratory symptoms--- he was tested for COVID at the time, was found to be positive for COVID-19 on 08/25/2019 test, he was at that time sent home he continued to run fevers subsequently developed some nausea vomiting and then few days later started developing some shortness of breath and cough.  Came to the ER on 09/02/2019 where he was diagnosed with COVID-19 pneumonia with hypoxic respiratory failure and admitted.   Subjective:    Mitchell Quinn today has, No headache, No chest pain, No abdominal pain - No Nausea, No new weakness tingling or numbness, improved Cough - SOB.    Assessment  & Plan :     1. Acute Hypoxic Resp. Failure due to Acute Covid 19 Viral Pneumonitis during the ongoing 2020 Covid 19 Pandemic - he has been started on combination of IV Remdisvir and steroids with good effect, continue both.  Monitor inflammatory markers closely.  He seems to have clinically stabilized.  He has been encouraged to sit up in chair in the morning use flutter valve for pulmonary toiletry and prone at night in bed.  If he gets worse Actemra will be used.  Actemra off label use - patient was told that if COVID-19 pneumonitis gets worse we might potentially use Actemra off label, she  denies any known history of tuberculosis or hepatitis, understands the risks and benefits and wants to proceed with Actemra treatment if required.   COVID-19 Labs  Recent Labs    09/02/19 1128 09/03/19 0203  DDIMER 1.15* 0.67*  FERRITIN 1,072* 784*  LDH 279* 262*  CRP  16.4* 12.4*    Lab Results  Component Value Date   SARSCOV2NAA DETECTED (A) 08/25/2019   [place] Not Detected 08/02/2019     Hepatic Function Latest Ref Rng & Units 09/03/2019 09/02/2019 08/29/2019  Total Protein 6.5 - 8.1 g/dL 7.6 7.4 7.9  Albumin 3.5 - 5.0 g/dL 3.6 3.5 4.2  AST 15 - 41 U/L 30 32 36  ALT 0 - 44 U/L 33 29 32  Alk Phosphatase 38 - 126 U/L 65 63 67  Total Bilirubin 0.3 - 1.2 mg/dL 1.0 0.7 0.4        Component Value Date/Time   BNP 19.8 09/03/2019 0203      2.  Occasional smoking.  Counseled to quit.      Condition - Fair  Family Communication  :  None  Code Status :  Full  Diet :   Diet Order            Diet regular Room service appropriate? Yes; Fluid consistency: Thin  Diet effective now               Disposition Plan  :  TBD  Consults  :  None  Procedures  :   PUD Prophylaxis :    DVT Prophylaxis  :  Lovenox   Lab Results  Component Value Date   PLT 325 09/03/2019    Inpatient Medications  Scheduled Meds: . albuterol  2 puff Inhalation QID  . enoxaparin (LOVENOX) injection  55 mg Subcutaneous Q24H  . famotidine  20 mg Oral BID  . folic acid  1 mg Oral Daily  . guaiFENesin  600 mg Oral BID  . methylPREDNISolone (SOLU-MEDROL) injection  60 mg Intravenous Q12H  . multivitamin with minerals  1 tablet Oral Daily  . thiamine  100 mg Oral Daily  . vitamin C  500 mg Oral Daily  . zinc sulfate  220 mg Oral Daily   Continuous Infusions: . sodium chloride Stopped (09/02/19 2230)  . remdesivir 100 mg in NS 250 mL     PRN Meds:.albuterol, loperamide, ondansetron  Antibiotics  :    Anti-infectives (From admission, onward)   Start     Dose/Rate Route  Frequency Ordered Stop   09/03/19 1900  remdesivir 100 mg in sodium chloride 0.9 % 250 mL IVPB     100 mg 500 mL/hr over 30 Minutes Intravenous Every 24 hours 09/02/19 1755 09/07/19 1859   09/02/19 1900  remdesivir 200 mg in sodium chloride 0.9 % 250 mL IVPB     200 mg 500 mL/hr over 30 Minutes Intravenous Once 09/02/19 1755 09/02/19 2002       Time Spent in minutes  30   [name] M.D on 09/03/2019 at 10:38 AM  To page go to [email or web address] - password [facility]  [facility] -  Office  [phone]    See all Orders from today for further details    Objective:   Vitals:   09/03/19 0200 09/03/19 0256 09/03/19 0300 09/03/19 0835  BP:  110/69  116/74  Pulse: 76  73 96  Resp: (!) 22  (!) 23 (!) 25  Temp:  98.6 F (37 C)  97.7 F (36.5 C)  TempSrc:  Oral  Oral  SpO2: (!) 88% 90% (!) 89% 90%  Weight:      Height:        Wt Readings from Last 3 Encounters:  09/02/19 108.9 kg  08/29/19 115.7 kg  05/01/19 108.9 kg     Intake/Output Summary (  Last 24 hours) at 09/03/2019 1038 Last data filed at 09/03/2019 0507 Gross per 24 hour  Intake 535.83 ml  Output 1000 ml  Net -464.17 ml     Physical Exam  Awake Alert, Oriented X 3, No new F.N deficits, Normal affect .AT,PERRAL Supple Neck,No JVD, No cervical lymphadenopathy appriciated.  Symmetrical Chest wall movement, Good air movement bilaterally, CTAB RRR,No Gallops,Rubs or new Murmurs, No Parasternal Heave +ve B.Sounds, Abd Soft, No tenderness, No organomegaly appriciated, No rebound - guarding or rigidity. No Cyanosis, Clubbing or edema, No new Rash or bruise       Data Review:    CBC Recent Labs  Lab 08/29/19 1633 09/02/19 1128 09/03/19 0203  WBC 6.6 10.0 6.5  HGB 13.9 13.6 13.3  HCT 41.0 40.2 38.5*  PLT 175 281 325  MCV 92.1 90.7 89.7  MCH 31.2 30.7 31.0  MCHC 33.9 33.8 34.5  RDW 12.4 12.3 12.1  LYMPHSABS 1.1 1.4 1.2  MONOABS 0.3 0.6 0.1  EOSABS 0.0 0.0 0.0  BASOSABS 0.0 0.0 0.1     Chemistries  Recent Labs  Lab 08/29/19 1633 09/02/19 1128 09/03/19 0203  NA 137 136 138  K 3.8 3.7 3.8  CL 101 100 103  CO2 _0 GLUCOSE 101* 103* 141*  BUN _1 CREATININE 1.02 0.88 0.52*  CALCIUM 8.5* 8.6* 8.7*  MG  --   --  2.7*  AST 36 32 30  ALT 32 29 33  ALKPHOS 67 63 65  BILITOT 0.4 0.7 1.0   ------------------------------------------------------------------------------------------------------------------ Recent Labs    09/02/19 1128  TRIG 139    No results found for: HGBA1C ------------------------------------------------------------------------------------------------------------------ No results for input(s): TSH, T4TOTAL, T3FREE, THYROIDAB in the last 72 hours.  Invalid input(s): FREET3  Cardiac Enzymes No results for input(s): CKMB, TROPONINI, MYOGLOBIN in the last 168 hours.  Invalid input(s): CK ------------------------------------------------------------------------------------------------------------------    Component Value Date/Time   BNP 19.8 09/03/2019 0203    Micro Results Recent Results (from the past 240 hour(s))  Novel Coronavirus, NAA (Hosp order, Send-out to Ref Lab; TAT 18-24 hrs     Status: Abnormal   Collection Time: 08/25/19  3:48 PM   Specimen: Nasopharyngeal Swab; Respiratory  Result Value Ref Range Status   SARS-CoV-2, NAA DETECTED (A) NOT DETECTED Final    Comment: RESULT CALLED TO, READ BACK BY AND VERIFIED WITH: RN C GOSS [id number] MLM (NOTE)                  Client Requested Flag This test was developed and its performance characteristics determined by [employer]. This test has not been FDA cleared or approved. This test has been authorized by FDA under an Emergency Use Authorization (EUA). This test is only authorized for the duration of time the declaration that circumstances exist justifying the authorization of the emergency use of in vitro diagnostic tests for detection of SARS-CoV-2 virus  and/or diagnosis of COVID-19 infection under section 564(b)(1) of the Act, 21 U.S.C. 465KCL-2(X)(5), unless the authorization is terminated or revoked sooner. When diagnostic testing is negative, the possibility of a false negative result should be considered in the context of a patient's recent exposures and the presence of clinical signs and symptoms consistent with COVID-19. An individual without symptoms of COVID-19 and  who is not shedding SARS-CoV-2 virus would expect to have a negative (not detected) result in this assay. Performed At: [facility] [place], [place] [id number] Rush Farmer MD WH:[id number]  Coronavirus Source NASOPHARYNGEAL  Final    Comment: Performed at [facility], [place] [address] [place], [place] [address]  Blood Culture (routine x 2)     Status: None (Preliminary result)   Collection Time: 09/02/19 11:29 AM   Specimen: Right Antecubital; Blood  Result Value Ref Range Status   Specimen Description RIGHT ANTECUBITAL  Final   Special Requests   Final    BOTTLES DRAWN AEROBIC AND ANAEROBIC Blood Culture adequate volume   Culture   Final    NO GROWTH < 24 HOURS Performed at [facility], [address], [place], [place] [address]    Report Status PENDING  Incomplete  Blood Culture (routine x 2)     Status: None (Preliminary result)   Collection Time: 09/02/19 11:34 AM   Specimen: Left Antecubital; Blood  Result Value Ref Range Status   Specimen Description LEFT ANTECUBITAL  Final   Special Requests   Final    BOTTLES DRAWN AEROBIC AND ANAEROBIC Blood Culture adequate volume   Culture   Final    NO GROWTH < 24 HOURS Performed at [facility], [address], [place], [place] [address]    Report Status PENDING  Incomplete    Radiology Reports Dg Chest Port 1 View  Result Date: 09/02/2019 CLINICAL DATA:  Cough and shortness of breath. Known COVID-19 infection. EXAM: PORTABLE CHEST 1 VIEW COMPARISON:  August 29, 2019  FINDINGS: Cardiomediastinal silhouette is normal. Mediastinal contours appear intact. There is no evidence of pleural effusion or pneumothorax. Bilateral patchy airspace consolidation with lower lobe predominance. Low lung volumes. Osseous structures are without acute abnormality. Soft tissues are grossly normal. IMPRESSION: 1. Bilateral patchy airspace consolidation with lower lobe predominance, consistent with atypical multifocal pneumonia. 2. Low lung volumes. Electronically Signed   By: Fidela Salisbury M.D.   On: 09/02/2019 12:26   Dg Chest Portable 1 View  Result Date: 08/29/2019 CLINICAL DATA:  Cough.  Dyspnea. EXAM: PORTABLE CHEST 1 VIEW COMPARISON:  May 01, 2019 FINDINGS: There is an airspace opacity at the right lung base, new since prior x-ray in May 2020. The heart size remains mildly enlarged. There is no pneumothorax. No large pleural effusion. No acute osseous abnormality. IMPRESSION: 1. Airspace opacity at the right lung base concerning for infiltrate secondary to viral or bacterial pneumonia. 2. Borderline cardiomegaly. Electronically Signed   By: Constance Holster M.D.   On: 08/29/2019 16:57

## 2019-09-03 NOTE — Progress Notes (Signed)
Instructed by pt that there is no need to call wife and update on POC.

## 2019-09-04 LAB — CBC WITH DIFFERENTIAL/PLATELET
Abs Immature Granulocytes: 1.33 10*3/uL — ABNORMAL HIGH (ref 0.00–0.07)
Basophils Absolute: 0.2 10*3/uL — ABNORMAL HIGH (ref 0.0–0.1)
Basophils Relative: 1 %
Eosinophils Absolute: 0 10*3/uL (ref 0.0–0.5)
Eosinophils Relative: 0 %
HCT: 37.3 % — ABNORMAL LOW (ref 39.0–52.0)
Hemoglobin: 13 g/dL (ref 13.0–17.0)
Immature Granulocytes: 7 %
Lymphocytes Relative: 9 %
Lymphs Abs: 1.8 10*3/uL (ref 0.7–4.0)
MCH: 31.4 pg (ref 26.0–34.0)
MCHC: 34.9 g/dL (ref 30.0–36.0)
MCV: 90.1 fL (ref 80.0–100.0)
Monocytes Absolute: 0.7 10*3/uL (ref 0.1–1.0)
Monocytes Relative: 4 %
Neutro Abs: 15.3 10*3/uL — ABNORMAL HIGH (ref 1.7–7.7)
Neutrophils Relative %: 79 %
Platelets: 402 10*3/uL — ABNORMAL HIGH (ref 150–400)
RBC: 4.14 MIL/uL — ABNORMAL LOW (ref 4.22–5.81)
RDW: 12.1 % (ref 11.5–15.5)
WBC: 19.2 10*3/uL — ABNORMAL HIGH (ref 4.0–10.5)
nRBC: 0 % (ref 0.0–0.2)

## 2019-09-04 LAB — COMPREHENSIVE METABOLIC PANEL
ALT: 68 U/L — ABNORMAL HIGH (ref 0–44)
AST: 44 U/L — ABNORMAL HIGH (ref 15–41)
Albumin: 3.2 g/dL — ABNORMAL LOW (ref 3.5–5.0)
Alkaline Phosphatase: 69 U/L (ref 38–126)
Anion gap: 12 (ref 5–15)
BUN: 18 mg/dL (ref 6–20)
CO2: 24 mmol/L (ref 22–32)
Calcium: 8.8 mg/dL — ABNORMAL LOW (ref 8.9–10.3)
Chloride: 103 mmol/L (ref 98–111)
Creatinine, Ser: 0.71 mg/dL (ref 0.61–1.24)
GFR calc Af Amer: >60 mL/min (ref >60)
GFR calc non Af Amer: >60 mL/min (ref >60)
Glucose, Bld: 128 mg/dL — ABNORMAL HIGH (ref 70–99)
Potassium: 4.2 mmol/L (ref 3.5–5.1)
Sodium: 139 mmol/L (ref 135–145)
Total Bilirubin: 0.5 mg/dL (ref 0.3–1.2)
Total Protein: 7 g/dL (ref 6.5–8.1)

## 2019-09-04 LAB — MAGNESIUM: Magnesium: 2.5 mg/dL — ABNORMAL HIGH (ref 1.7–2.4)

## 2019-09-04 LAB — D-DIMER, QUANTITATIVE: D-Dimer, Quant: 0.38 ug/mL-FEU (ref 0.00–0.50)

## 2019-09-04 LAB — FERRITIN: Ferritin: 668 ng/mL — ABNORMAL HIGH (ref 24–336)

## 2019-09-04 LAB — BRAIN NATRIURETIC PEPTIDE: B Natriuretic Peptide: 44.5 pg/mL (ref 0.0–100.0)

## 2019-09-04 LAB — C-REACTIVE PROTEIN: CRP: 5.6 mg/dL — ABNORMAL HIGH (ref <1.0)

## 2019-09-04 LAB — LACTATE DEHYDROGENASE: LDH: 277 U/L — ABNORMAL HIGH (ref 98–192)

## 2019-09-04 LAB — PROCALCITONIN: Procalcitonin: <0.1 ng/mL

## 2019-09-04 MED ORDER — METHYLPREDNISOLONE SODIUM SUCC 40 MG IJ SOLR
40.0000 mg | Freq: Two times a day (BID) | INTRAMUSCULAR | Status: DC
  Administered 2019-09-04 – 2019-09-06 (×4): 40 mg via INTRAVENOUS
  Filled 2019-09-04 (×4): qty 1

## 2019-09-04 NOTE — Progress Notes (Signed)
Attempted to call wife for update.  No answer.  

## 2019-09-04 NOTE — Progress Notes (Signed)
PROGRESS NOTE                                                                                                                                                                                                             Patient Demographics:    Mitchell Quinn, is a 28 y.o. male, DOB - [birth year], LYY:[id number]  Outpatient Primary MD for the patient is [facility] [place] date - 09/02/2019    Chief Complaint  Patient presents with  . covid  . Shortness of Breath       Brief Narrative   Mitchell Quinn  is a 28 y.o. male without significant past medical history who was seen in urgent care on 08/25/2019 with fevers above 102 and respiratory symptoms--- he was tested for COVID at the time, was found to be positive for COVID-19 on 08/25/2019 test, he was at that time sent home he continued to run fevers subsequently developed some nausea vomiting and then few days later started developing some shortness of breath and cough.  Came to the ER on 09/02/2019 where he was diagnosed with COVID-19 pneumonia with hypoxic respiratory failure and admitted.   Subjective:   Patient in bed, appears comfortable, denies any headache, no fever, no chest pain or pressure, no shortness of breath , no abdominal pain. No focal weakness.   Assessment  & Plan :     1. Acute Hypoxic Resp. Failure due to Acute Covid 19 Viral Pneumonitis during the ongoing 2020 Covid 19 Pandemic - he has been started on combination of IV Remdisvir and steroids with good effect, continue both.  Monitor inflammatory markers closely which seems to have stabalized.  He seems to have clinically stabilized.  He has been encouraged to sit up in chair in the morning use flutter valve for pulmonary toiletry and prone at night in bed.  If he gets worse Actemra will be used.  Actemra off label use - patient was told that if COVID-19 pneumonitis gets worse we might  potentially use Actemra off label, she denies any known history of tuberculosis or hepatitis, understands the risks and benefits and wants to proceed with Actemra treatment if required.   COVID-19 Labs  Recent Labs    09/02/19 1128 09/03/19 0203 09/04/19 0320  DDIMER 1.15* 0.67* 0.38  FERRITIN 1,072*  784* 668*  LDH 279* 262* 277*  CRP 16.4* 12.4* 5.6*    Lab Results  Component Value Date   SARSCOV2NAA DETECTED (A) 08/25/2019   [place] Not Detected 08/02/2019     Hepatic Function Latest Ref Rng & Units 09/04/2019 09/03/2019 09/02/2019  Total Protein 6.5 - 8.1 g/dL 7.0 7.6 7.4  Albumin 3.5 - 5.0 g/dL 3.2(L) 3.6 3.5  AST 15 - 41 U/L 44(H) 30 32  ALT 0 - 44 U/L 68(H) 33 29  Alk Phosphatase 38 - 126 U/L 69 65 63  Total Bilirubin 0.3 - 1.2 mg/dL 0.5 1.0 0.7        Component Value Date/Time   BNP 44.5 09/04/2019 0320      2.  Occasional smoking.  Counseled to quit.     Condition - Fair  Family Communication  :  None  Code Status :  Full  Diet :   Diet Order            Diet regular Room service appropriate? Yes; Fluid consistency: Thin  Diet effective now               Disposition Plan  :  TBD  Consults  :  None  Procedures  :   PUD Prophylaxis :    DVT Prophylaxis  :  Lovenox   Lab Results  Component Value Date   PLT 402 (H) 09/04/2019    Inpatient Medications  Scheduled Meds: . albuterol  2 puff Inhalation QID  . enoxaparin (LOVENOX) injection  55 mg Subcutaneous Q24H  . famotidine  20 mg Oral BID  . folic acid  1 mg Oral Daily  . guaiFENesin  600 mg Oral BID  . methylPREDNISolone (SOLU-MEDROL) injection  60 mg Intravenous Q12H  . multivitamin with minerals  1 tablet Oral Daily  . thiamine  100 mg Oral Daily  . vitamin C  500 mg Oral Daily  . zinc sulfate  220 mg Oral Daily   Continuous Infusions: . remdesivir 100 mg in NS 250 mL Stopped (09/03/19 1930)   PRN Meds:.albuterol, loperamide, ondansetron  Antibiotics  :     Anti-infectives (From admission, onward)   Start     Dose/Rate Route Frequency Ordered Stop   09/03/19 1900  remdesivir 100 mg in sodium chloride 0.9 % 250 mL IVPB     100 mg 500 mL/hr over 30 Minutes Intravenous Every 24 hours 09/02/19 1755 09/07/19 1859   09/02/19 1900  remdesivir 200 mg in sodium chloride 0.9 % 250 mL IVPB     200 mg 500 mL/hr over 30 Minutes Intravenous Once 09/02/19 1755 09/03/19 0700       Time Spent in minutes  30   [name] M.D on 09/04/2019 at 9:01 AM  To page go to [email or web address] - password [facility]  [facility] -  Office  [phone]    See all Orders from today for further details    Objective:   Vitals:   09/03/19 1900 09/03/19 1915 09/04/19 0420 09/04/19 0800  BP:  105/74 117/72   Pulse: 81 83 71 74  Resp:    19  Temp:  97.7 F (36.5 C) 98.3 F (36.8 C) (!) 97.5 F (36.4 C)  TempSrc:  Oral Oral Axillary  SpO2: 93% 92% 92% 93%  Weight:      Height:        Wt Readings from Last 3 Encounters:  09/02/19 108.9 kg  08/29/19 115.7 kg  05/01/19 108.9 kg  Intake/Output Summary (Last 24 hours) at 09/04/2019 0901 Last data filed at 09/03/2019 2000 Gross per 24 hour  Intake -  Output 400 ml  Net -400 ml     Physical Exam  Awake Alert, Oriented X 3, No new F.N deficits, Normal affect [place].AT,PERRAL Supple Neck,No JVD, No cervical lymphadenopathy appriciated.  Symmetrical Chest wall movement, Good air movement bilaterally, CTAB RRR,No Gallops, Rubs or new Murmurs, No Parasternal Heave +ve B.Sounds, Abd Soft, No tenderness, No organomegaly appriciated, No rebound - guarding or rigidity. No Cyanosis, Clubbing or edema, No new Rash or bruise    Data Review:    CBC Recent Labs  Lab 08/29/19 1633 09/02/19 1128 09/03/19 0203 09/04/19 0320  WBC 6.6 10.0 6.5 19.2*  HGB 13.9 13.6 13.3 13.0  HCT 41.0 40.2 38.5* 37.3*  PLT 175 281 325 402*  MCV 92.1 90.7 89.7 90.1  MCH 31.2 30.7 31.0 31.4  MCHC 33.9 33.8 34.5 34.9  RDW  12.4 12.3 12.1 12.1  LYMPHSABS 1.1 1.4 1.2 1.8  MONOABS 0.3 0.6 0.1 0.7  EOSABS 0.0 0.0 0.0 0.0  BASOSABS 0.0 0.0 0.1 0.2*    Chemistries  Recent Labs  Lab 08/29/19 1633 09/02/19 1128 09/03/19 0203 09/04/19 0320  NA 137 136 138 139  K 3.8 3.7 3.8 4.2  CL 101 100 103 103  CO2 '27 24 25 24  '$ GLUCOSE 101* 103* 141* 128*  BUN '9 14 14 18  '$ CREATININE 1.02 0.88 0.52* 0.71  CALCIUM 8.5* 8.6* 8.7* 8.8*  MG  --   --  2.7* 2.5*  AST 36 32 30 44*  ALT 32 29 33 68*  ALKPHOS 67 63 65 69  BILITOT 0.4 0.7 1.0 0.5   ------------------------------------------------------------------------------------------------------------------ Recent Labs    09/02/19 1128  TRIG 139    No results found for: HGBA1C ------------------------------------------------------------------------------------------------------------------ No results for input(s): TSH, T4TOTAL, T3FREE, THYROIDAB in the last 72 hours.  Invalid input(s): FREET3  Cardiac Enzymes No results for input(s): CKMB, TROPONINI, MYOGLOBIN in the last 168 hours.  Invalid input(s): CK ------------------------------------------------------------------------------------------------------------------    Component Value Date/Time   BNP 44.5 09/04/2019 0320    Micro Results Recent Results (from the past 240 hour(s))  Novel Coronavirus, NAA (Hosp order, Send-out to Ref Lab; TAT 18-24 hrs     Status: Abnormal   Collection Time: 08/25/19  3:48 PM   Specimen: Nasopharyngeal Swab; Respiratory  Result Value Ref Range Status   SARS-CoV-2, NAA DETECTED (A) NOT DETECTED Final    Comment: RESULT CALLED TO, READ BACK BY AND VERIFIED WITH: RN C GOSS [id number] [phone] MLM (NOTE)                  Client Requested Flag This test was developed and its performance characteristics determined by [employer]. This test has not been FDA cleared or approved. This test has been authorized by FDA under an Emergency Use Authorization (EUA). This test is  only authorized for the duration of time the declaration that circumstances exist justifying the authorization of the emergency use of in vitro diagnostic tests for detection of SARS-CoV-2 virus and/or diagnosis of COVID-19 infection under section 564(b)(1) of the Act, 21 U.S.C. 741OIN-8(M)(7), unless the authorization is terminated or revoked sooner. When diagnostic testing is negative, the possibility of a false negative result should be considered in the context of a patient's recent exposures and the presence of clinical signs and symptoms consistent with COVID-19. An individual without symptoms of COVID-19 and  who is not shedding SARS-CoV-2 virus  would expect to have a negative (not detected) result in this assay. Performed At: [facility] [address] [place], [place] [id number] Rush Farmer MD MB:[id number]    [place]  Final    Comment: Performed at [facility], [place] [address], [place], [place] [address]  Blood Culture (routine x 2)     Status: None (Preliminary result)   Collection Time: 09/02/19 11:29 AM   Specimen: Right Antecubital; Blood  Result Value Ref Range Status   Specimen Description RIGHT ANTECUBITAL  Final   Special Requests   Final    BOTTLES DRAWN AEROBIC AND ANAEROBIC Blood Culture adequate volume   Culture   Final    NO GROWTH 2 DAYS Performed at [facility], [address] [place], [place] [address]    Report Status PENDING  Incomplete  Blood Culture (routine x 2)     Status: None (Preliminary result)   Collection Time: 09/02/19 11:34 AM   Specimen: Left Antecubital; Blood  Result Value Ref Range Status   Specimen Description LEFT ANTECUBITAL  Final   Special Requests   Final    BOTTLES DRAWN AEROBIC AND ANAEROBIC Blood Culture adequate volume   Culture   Final    NO GROWTH 2 DAYS Performed at [facility], [address] [place], [place] [address]    Report Status PENDING  Incomplete    Radiology  Reports Dg Chest Port 1 View  Result Date: 09/02/2019 CLINICAL DATA:  Cough and shortness of breath. Known COVID-19 infection. EXAM: PORTABLE CHEST 1 VIEW COMPARISON:  August 29, 2019 FINDINGS: Cardiomediastinal silhouette is normal. Mediastinal contours appear intact. There is no evidence of pleural effusion or pneumothorax. Bilateral patchy airspace consolidation with lower lobe predominance. Low lung volumes. Osseous structures are without acute abnormality. Soft tissues are grossly normal. IMPRESSION: 1. Bilateral patchy airspace consolidation with lower lobe predominance, consistent with atypical multifocal pneumonia. 2. Low lung volumes. Electronically Signed   By: Fidela Salisbury M.D.   On: 09/02/2019 12:26   Dg Chest Portable 1 View  Result Date: 08/29/2019 CLINICAL DATA:  Cough.  Dyspnea. EXAM: PORTABLE CHEST 1 VIEW COMPARISON:  May 01, 2019 FINDINGS: There is an airspace opacity at the right lung base, new since prior x-ray in May 2020. The heart size remains mildly enlarged. There is no pneumothorax. No large pleural effusion. No acute osseous abnormality. IMPRESSION: 1. Airspace opacity at the right lung base concerning for infiltrate secondary to viral or bacterial pneumonia. 2. Borderline cardiomegaly. Electronically Signed   By: Constance Holster M.D.   On: 08/29/2019 16:57

## 2019-09-05 LAB — CBC WITH DIFFERENTIAL/PLATELET
Abs Immature Granulocytes: 1.97 10*3/uL — ABNORMAL HIGH (ref 0.00–0.07)
Basophils Absolute: 0 10*3/uL (ref 0.0–0.1)
Basophils Relative: 0 %
Eosinophils Absolute: 0 10*3/uL (ref 0.0–0.5)
Eosinophils Relative: 0 %
HCT: 36.7 % — ABNORMAL LOW (ref 39.0–52.0)
Hemoglobin: 12.5 g/dL — ABNORMAL LOW (ref 13.0–17.0)
Immature Granulocytes: 11 %
Lymphocytes Relative: 9 %
Lymphs Abs: 1.6 10*3/uL (ref 0.7–4.0)
MCH: 31 pg (ref 26.0–34.0)
MCHC: 34.1 g/dL (ref 30.0–36.0)
MCV: 91.1 fL (ref 80.0–100.0)
Monocytes Absolute: 1 10*3/uL (ref 0.1–1.0)
Monocytes Relative: 6 %
Neutro Abs: 13.4 10*3/uL — ABNORMAL HIGH (ref 1.7–7.7)
Neutrophils Relative %: 74 %
Platelets: 416 10*3/uL — ABNORMAL HIGH (ref 150–400)
RBC: 4.03 MIL/uL — ABNORMAL LOW (ref 4.22–5.81)
RDW: 12.1 % (ref 11.5–15.5)
WBC: 18 10*3/uL — ABNORMAL HIGH (ref 4.0–10.5)
nRBC: 0 % (ref 0.0–0.2)

## 2019-09-05 LAB — BRAIN NATRIURETIC PEPTIDE: B Natriuretic Peptide: 51.8 pg/mL (ref 0.0–100.0)

## 2019-09-05 LAB — COMPREHENSIVE METABOLIC PANEL
ALT: 63 U/L — ABNORMAL HIGH (ref 0–44)
AST: 31 U/L (ref 15–41)
Albumin: 3.2 g/dL — ABNORMAL LOW (ref 3.5–5.0)
Alkaline Phosphatase: 68 U/L (ref 38–126)
Anion gap: 9 (ref 5–15)
BUN: 18 mg/dL (ref 6–20)
CO2: 26 mmol/L (ref 22–32)
Calcium: 8.7 mg/dL — ABNORMAL LOW (ref 8.9–10.3)
Chloride: 104 mmol/L (ref 98–111)
Creatinine, Ser: 0.69 mg/dL (ref 0.61–1.24)
GFR calc Af Amer: >60 mL/min (ref >60)
GFR calc non Af Amer: >60 mL/min (ref >60)
Glucose, Bld: 128 mg/dL — ABNORMAL HIGH (ref 70–99)
Potassium: 4.6 mmol/L (ref 3.5–5.1)
Sodium: 139 mmol/L (ref 135–145)
Total Bilirubin: 0.6 mg/dL (ref 0.3–1.2)
Total Protein: 6.8 g/dL (ref 6.5–8.1)

## 2019-09-05 LAB — MAGNESIUM: Magnesium: 2.5 mg/dL — ABNORMAL HIGH (ref 1.7–2.4)

## 2019-09-05 LAB — PROCALCITONIN: Procalcitonin: <0.1 ng/mL

## 2019-09-05 LAB — C-REACTIVE PROTEIN: CRP: 2.5 mg/dL — ABNORMAL HIGH (ref <1.0)

## 2019-09-05 LAB — FERRITIN: Ferritin: 657 ng/mL — ABNORMAL HIGH (ref 24–336)

## 2019-09-05 LAB — LACTATE DEHYDROGENASE: LDH: 258 U/L — ABNORMAL HIGH (ref 98–192)

## 2019-09-05 LAB — D-DIMER, QUANTITATIVE: D-Dimer, Quant: <0.27 ug/mL-FEU (ref 0.00–0.50)

## 2019-09-05 MED ORDER — SODIUM CHLORIDE 0.9 % IV SOLN
100.0000 mg | INTRAVENOUS | Status: AC
  Administered 2019-09-06: 100 mg via INTRAVENOUS
  Filled 2019-09-05: qty 20

## 2019-09-05 NOTE — Progress Notes (Signed)
PROGRESS NOTE                                                                                                                                                                                                             Patient Demographics:    Mitchell Quinn, is a 28 y.o. male, DOB - 18-Jun-1991, SWH:[id number]  Outpatient Primary MD for the patient is [facility] [place] date - 09/02/2019    Chief Complaint  Patient presents with   covid   Shortness of Breath       Brief Narrative   Mitchell Quinn  is a 28 y.o. male without significant past medical history who was seen in urgent care on 08/25/2019 with fevers above 102 and respiratory symptoms--- he was tested for COVID at the time, was found to be positive for COVID-19 on 08/25/2019 test, he was at that time sent home he continued to run fevers subsequently developed some nausea vomiting and then few days later started developing some shortness of breath and cough.  Came to the ER on 09/02/2019 where he was diagnosed with COVID-19 pneumonia with hypoxic respiratory failure and admitted.   Subjective:   Patient in bed, appears comfortable, denies any headache, no fever, no chest pain or pressure, no shortness of breath , no abdominal pain. No focal weakness.    Assessment  & Plan :     1. Acute Hypoxic Resp. Failure due to Acute Covid 19 Viral Pneumonitis during the ongoing 2020 Covid 19 Pandemic - he has been started on combination of IV Remdisvir and steroids with good effect, continue both.  Monitor inflammatory markers closely which seems to have stabalized.  He seems to have clinically stabilized.  He has been encouraged to sit up in chair in the morning use flutter valve for pulmonary toiletry and prone at night in bed.  If he gets worse Actemra will be used.  Actemra off label use - patient was told that if COVID-19 pneumonitis gets worse we might  potentially use Actemra off label, she denies any known history of tuberculosis or hepatitis, understands the risks and benefits and wants to proceed with Actemra treatment if required.   COVID-19 Labs  Recent Labs    09/03/19 0203 09/04/19 0320 09/05/19 0228  DDIMER 0.67* 0.38 <0.27  FERRITIN  784* 668* 657*  LDH 262* 277* 258*  CRP 12.4* 5.6* 2.5*    Lab Results  Component Value Date   SARSCOV2NAA DETECTED (A) 08/25/2019   [place] Not Detected 08/02/2019     Hepatic Function Latest Ref Rng & Units 09/05/2019 09/04/2019 09/03/2019  Total Protein 6.5 - 8.1 g/dL 6.8 7.0 7.6  Albumin 3.5 - 5.0 g/dL 3.2(L) 3.2(L) 3.6  AST 15 - 41 U/L 31 44(H) 30  ALT 0 - 44 U/L 63(H) 68(H) 33  Alk Phosphatase 38 - 126 U/L 68 69 65  Total Bilirubin 0.3 - 1.2 mg/dL 0.6 0.5 1.0        Component Value Date/Time   BNP 51.8 09/05/2019 0228      2.  Occasional smoking.  Counseled to quit.  3.  Mild COVID-19 related transaminitis.  Stable trend.  Asymptomatic.   Condition - Fair  Family Communication  :  None  Code Status :  Full  Diet :   Diet Order            Diet regular Room service appropriate? Yes; Fluid consistency: Thin  Diet effective now               Disposition Plan  :  TBD  Consults  :  None  Procedures  :   PUD Prophylaxis :    DVT Prophylaxis  :  Lovenox   Lab Results  Component Value Date   PLT 416 (H) 09/05/2019    Inpatient Medications  Scheduled Meds:  albuterol  2 puff Inhalation QID   enoxaparin (LOVENOX) injection  55 mg Subcutaneous Q24H   famotidine  20 mg Oral BID   folic acid  1 mg Oral Daily   guaiFENesin  600 mg Oral BID   methylPREDNISolone (SOLU-MEDROL) injection  40 mg Intravenous Q12H   multivitamin with minerals  1 tablet Oral Daily   thiamine  100 mg Oral Daily   vitamin C  500 mg Oral Daily   zinc sulfate  220 mg Oral Daily   Continuous Infusions:  remdesivir 100 mg in NS 250 mL 100 mg (09/04/19 1814)   PRN  Meds:.albuterol, loperamide, ondansetron  Antibiotics  :    Anti-infectives (From admission, onward)   Start     Dose/Rate Route Frequency Ordered Stop   09/03/19 1900  remdesivir 100 mg in sodium chloride 0.9 % 250 mL IVPB     100 mg 500 mL/hr over 30 Minutes Intravenous Every 24 hours 09/02/19 1755 09/07/19 1859   09/02/19 1900  remdesivir 200 mg in sodium chloride 0.9 % 250 mL IVPB     200 mg 500 mL/hr over 30 Minutes Intravenous Once 09/02/19 1755 09/03/19 0700       Time Spent in minutes  30   [name] M.D on 09/05/2019 at 9:01 AM  To page go to [email or web address] - password [facility]  [facility] -  Office  [phone]    See all Orders from today for further details    Objective:   Vitals:   09/04/19 1800 09/04/19 1948 09/05/19 0409 09/05/19 0730  BP:  129/85 114/73 110/84  Pulse: 76 78 (!) 55 72  Resp:  19 16   Temp:  97.8 F (36.6 C) 97.9 F (36.6 C) 98.3 F (36.8 C)  TempSrc:  Oral Oral Oral  SpO2: 95% 91% 92% 93%  Weight:      Height:        Wt Readings from Last 3 Encounters:  09/02/19 108.9  kg  08/29/19 115.7 kg  05/01/19 108.9 kg     Intake/Output Summary (Last 24 hours) at 09/05/2019 0901 Last data filed at 09/04/2019 1854 Gross per 24 hour  Intake 250 ml  Output --  Net 250 ml     Physical Exam  Awake Alert, Oriented X 3, No new F.N deficits, Normal affect [place].AT,PERRAL Supple Neck,No JVD, No cervical lymphadenopathy appriciated.  Symmetrical Chest wall movement, Good air movement bilaterally, CTAB RRR,No Gallops, Rubs or new Murmurs, No Parasternal Heave +ve B.Sounds, Abd Soft, No tenderness, No organomegaly appriciated, No rebound - guarding or rigidity. No Cyanosis, Clubbing or edema, No new Rash or bruise    Data Review:    CBC Recent Labs  Lab 08/29/19 1633 09/02/19 1128 09/03/19 0203 09/04/19 0320 09/05/19 0228  WBC 6.6 10.0 6.5 19.2* 18.0*  HGB 13.9 13.6 13.3 13.0 12.5*  HCT 41.0 40.2 38.5* 37.3* 36.7*  PLT 175  281 325 402* 416*  MCV 92.1 90.7 89.7 90.1 91.1  MCH 31.2 30.7 31.0 31.4 31.0  MCHC 33.9 33.8 34.5 34.9 34.1  RDW 12.4 12.3 12.1 12.1 12.1  LYMPHSABS 1.1 1.4 1.2 1.8 1.6  MONOABS 0.3 0.6 0.1 0.7 1.0  EOSABS 0.0 0.0 0.0 0.0 0.0  BASOSABS 0.0 0.0 0.1 0.2* 0.0    Chemistries  Recent Labs  Lab 08/29/19 1633 09/02/19 1128 09/03/19 0203 09/04/19 0320 09/05/19 0228  NA 137 136 138 139 139  K 3.8 3.7 3.8 4.2 4.6  CL 101 100 103 103 104  CO2 '27 24 25 24 26  '$ GLUCOSE 101* 103* 141* 128* 128*  BUN '9 14 14 18 18  '$ CREATININE 1.02 0.88 0.52* 0.71 0.69  CALCIUM 8.5* 8.6* 8.7* 8.8* 8.7*  MG  --   --  2.7* 2.5* 2.5*  AST 36 32 30 44* 31  ALT 32 29 33 68* 63*  ALKPHOS 67 63 65 69 68  BILITOT 0.4 0.7 1.0 0.5 0.6   ------------------------------------------------------------------------------------------------------------------ Recent Labs    09/02/19 1128  TRIG 139    No results found for: HGBA1C ------------------------------------------------------------------------------------------------------------------ No results for input(s): TSH, T4TOTAL, T3FREE, THYROIDAB in the last 72 hours.  Invalid input(s): FREET3  Cardiac Enzymes No results for input(s): CKMB, TROPONINI, MYOGLOBIN in the last 168 hours.  Invalid input(s): CK ------------------------------------------------------------------------------------------------------------------    Component Value Date/Time   BNP 51.8 09/05/2019 0228    Micro Results Recent Results (from the past 240 hour(s))  Blood Culture (routine x 2)     Status: None (Preliminary result)   Collection Time: 09/02/19 11:29 AM   Specimen: Right Antecubital; Blood  Result Value Ref Range Status   Specimen Description RIGHT ANTECUBITAL  Final   Special Requests   Final    BOTTLES DRAWN AEROBIC AND ANAEROBIC Blood Culture adequate volume   Culture   Final    NO GROWTH 2 DAYS Performed at [facility], [address], [place], [place] [address]     Report Status PENDING  Incomplete  Blood Culture (routine x 2)     Status: None (Preliminary result)   Collection Time: 09/02/19 11:34 AM   Specimen: Left Antecubital; Blood  Result Value Ref Range Status   Specimen Description LEFT ANTECUBITAL  Final   Special Requests   Final    BOTTLES DRAWN AEROBIC AND ANAEROBIC Blood Culture adequate volume   Culture   Final    NO GROWTH 2 DAYS Performed at [facility], [address] [place], [place] [address]    Report Status PENDING  Incomplete    Radiology Reports Dg Chest Port 1 View  Result Date: 09/02/2019 CLINICAL DATA:  Cough and shortness of breath. Known COVID-19 infection. EXAM: PORTABLE CHEST 1 VIEW COMPARISON:  August 29, 2019 FINDINGS: Cardiomediastinal silhouette is normal. Mediastinal contours appear intact. There is no evidence of pleural effusion or pneumothorax. Bilateral patchy airspace consolidation with lower lobe predominance. Low lung volumes. Osseous structures are without acute abnormality. Soft tissues are grossly normal. IMPRESSION: 1. Bilateral patchy airspace consolidation with lower lobe predominance, consistent with atypical multifocal pneumonia. 2. Low lung volumes. Electronically Signed   By: Fidela Salisbury M.D.   On: 09/02/2019 12:26   Dg Chest Portable 1 View  Result Date: 08/29/2019 CLINICAL DATA:  Cough.  Dyspnea. EXAM: PORTABLE CHEST 1 VIEW COMPARISON:  May 01, 2019 FINDINGS: There is an airspace opacity at the right lung base, new since prior x-ray in May 2020. The heart size remains mildly enlarged. There is no pneumothorax. No large pleural effusion. No acute osseous abnormality. IMPRESSION: 1. Airspace opacity at the right lung base concerning for infiltrate secondary to viral or bacterial pneumonia. 2. Borderline cardiomegaly. Electronically Signed   By: Constance Holster M.D.   On: 08/29/2019 16:57

## 2019-09-06 LAB — COMPREHENSIVE METABOLIC PANEL
ALT: 88 U/L — ABNORMAL HIGH (ref 0–44)
AST: 44 U/L — ABNORMAL HIGH (ref 15–41)
Albumin: 3.4 g/dL — ABNORMAL LOW (ref 3.5–5.0)
Alkaline Phosphatase: 66 U/L (ref 38–126)
Anion gap: 14 (ref 5–15)
BUN: 18 mg/dL (ref 6–20)
CO2: 23 mmol/L (ref 22–32)
Calcium: 9 mg/dL (ref 8.9–10.3)
Chloride: 103 mmol/L (ref 98–111)
Creatinine, Ser: 0.73 mg/dL (ref 0.61–1.24)
GFR calc Af Amer: >60 mL/min (ref >60)
GFR calc non Af Amer: >60 mL/min (ref >60)
Glucose, Bld: 108 mg/dL — ABNORMAL HIGH (ref 70–99)
Potassium: 4.7 mmol/L (ref 3.5–5.1)
Sodium: 140 mmol/L (ref 135–145)
Total Bilirubin: 0.6 mg/dL (ref 0.3–1.2)
Total Protein: 6.8 g/dL (ref 6.5–8.1)

## 2019-09-06 LAB — CBC WITH DIFFERENTIAL/PLATELET
Abs Immature Granulocytes: 0.8 10*3/uL — ABNORMAL HIGH (ref 0.00–0.07)
Basophils Absolute: 0 10*3/uL (ref 0.0–0.1)
Basophils Relative: 0 %
Eosinophils Absolute: 0 10*3/uL (ref 0.0–0.5)
Eosinophils Relative: 0 %
HCT: 40 % (ref 39.0–52.0)
Hemoglobin: 13.5 g/dL (ref 13.0–17.0)
Lymphocytes Relative: 11 %
Lymphs Abs: 1.8 10*3/uL (ref 0.7–4.0)
MCH: 31 pg (ref 26.0–34.0)
MCHC: 33.8 g/dL (ref 30.0–36.0)
MCV: 91.7 fL (ref 80.0–100.0)
Monocytes Absolute: 1 10*3/uL (ref 0.1–1.0)
Monocytes Relative: 6 %
Myelocytes: 5 %
Neutro Abs: 12.5 10*3/uL — ABNORMAL HIGH (ref 1.7–7.7)
Neutrophils Relative %: 78 %
Platelets: 460 10*3/uL — ABNORMAL HIGH (ref 150–400)
RBC: 4.36 MIL/uL (ref 4.22–5.81)
RDW: 12.2 % (ref 11.5–15.5)
WBC: 16 10*3/uL — ABNORMAL HIGH (ref 4.0–10.5)
nRBC: 0 % (ref 0.0–0.2)

## 2019-09-06 LAB — MAGNESIUM: Magnesium: 2.4 mg/dL (ref 1.7–2.4)

## 2019-09-06 LAB — PROCALCITONIN: Procalcitonin: <0.1 ng/mL

## 2019-09-06 LAB — LACTATE DEHYDROGENASE: LDH: 290 U/L — ABNORMAL HIGH (ref 98–192)

## 2019-09-06 LAB — BRAIN NATRIURETIC PEPTIDE: B Natriuretic Peptide: 25.5 pg/mL (ref 0.0–100.0)

## 2019-09-06 LAB — FERRITIN: Ferritin: 802 ng/mL — ABNORMAL HIGH (ref 24–336)

## 2019-09-06 LAB — D-DIMER, QUANTITATIVE: D-Dimer, Quant: 0.45 ug/mL-FEU (ref 0.00–0.50)

## 2019-09-06 LAB — C-REACTIVE PROTEIN: CRP: 1.3 mg/dL — ABNORMAL HIGH (ref <1.0)

## 2019-09-06 NOTE — Progress Notes (Signed)
Nsg Discharge Note  Admit Date:  09/02/2019 Discharge date: 09/06/2019   Mitchell Quinn to be D/C'd Home per MD order.  AVS completed.  Copy for chart, and copy for patient signed, and dated. Patient/caregiver able to verbalize understanding.  Discharge Medication: Allergies as of 09/06/2019   No Known Allergies     Medication List    STOP taking these medications   azithromycin 250 MG tablet Commonly known as: ZITHROMAX   ibuprofen 400 MG tablet Commonly known as: ADVIL     TAKE these medications   albuterol 108 (90 Base) MCG/ACT inhaler Commonly known as: VENTOLIN HFA Inhale 2 puffs into the lungs every 4 (four) hours as needed for wheezing or shortness of breath.   albuterol 108 (90 Base) MCG/ACT inhaler Commonly known as: VENTOLIN HFA Inhale 2 puffs into the lungs every 6 (six) hours as needed for wheezing or shortness of breath.   benzonatate 100 MG capsule Commonly known as: Tessalon Perles Take 1 capsule (100 mg total) by mouth 3 (three) times daily as needed.   dicyclomine 20 MG tablet Commonly known as: BENTYL Take 1 tablet (20 mg total) by mouth 2 (two) times daily.   famotidine 20 MG tablet Commonly known as: PEPCID Take 1 tablet (20 mg total) by mouth 2 (two) times daily.   hydrocortisone 25 MG suppository Commonly known as: ANUSOL-HC Place 1 suppository (25 mg total) rectally 2 (two) times daily.   ondansetron 4 MG disintegrating tablet Commonly known as: Zofran ODT Take 1 tablet (4 mg total) by mouth every 8 (eight) hours as needed for nausea or vomiting.       Discharge Assessment: Vitals:   09/05/19 2030 09/06/19 0853  BP: 112/70 111/74  Pulse: 66 72  Resp: 18 18  Temp: 98.2 F (36.8 C) 98.1 F (36.7 C)  SpO2: 93% 95%   Skin clean, dry and intact without evidence of skin break down, no evidence of skin tears noted. IV catheter discontinued intact. Site without signs and symptoms of complications - no redness or edema noted at insertion  site, patient denies c/o pain - only slight tenderness at site.  Dressing with slight pressure applied.  D/c Instructions-Education: Discharge instructions given to patient/family with verbalized understanding. D/c education completed with patient/family including follow up instructions, medication list, d/c activities limitations if indicated, with other d/c instructions as indicated by MD - patient able to verbalize understanding, all questions fully answered. Patient instructed to return to ED, call 911, or call MD for any changes in condition.  Patient escorted via [name], and D/C home via private auto.  Mitchell Keys, RN 09/06/2019 11:33 AM

## 2019-09-06 NOTE — Plan of Care (Signed)

## 2019-09-06 NOTE — Care Management (Signed)
Patient discharging home, has no case management needs.

## 2019-09-06 NOTE — Discharge Instructions (Signed)
Follow with [facility] in 7 days   Get CBC, CMP, 2 view Chest X ray -  checked next visit within 1 week by Primary [facility]   Activity: As tolerated with Full fall precautions use walker/cane & assistance as needed  Disposition Home    Diet: Heart Healthy    Special Instructions: If you have smoked or chewed Tobacco  in the last 2 yrs please stop smoking, stop any regular Alcohol  and or any Recreational drug use.  On your next visit with your primary care physician please Get Medicines reviewed and adjusted.  Please request your Prim.Quinn to go over all Quinn Tests and Procedure/Radiological results at the follow up, please get all Quinn records sent to your Prim Quinn by signing Quinn release before you go home.  If you experience worsening of your admission symptoms, develop shortness of breath, life threatening emergency, suicidal or homicidal thoughts you must seek medical attention immediately by calling 911 or calling your Quinn immediately  if symptoms less severe.  You Must read complete instructions/literature along with all the possible adverse reactions/side effects for all the Medicines you take and that have been prescribed to you. Take any new Medicines after you have completely understood and accpet all the possible adverse reactions/side effects.                                                                              Mitchell Quinn                            [address]                            [place], [place] [address]      Mitchell Quinn was admitted to the [facility] on 09/02/2019 and Discharged  09/06/2019 and should be excused from work/school   for 21  days starting from date -  09/02/2019 , may return to work/school without any restrictions.  Call Mitchell Quinn, [facility]  [phone] with questions.  Mitchell Raring M.D on 09/06/2019,at 8:26 AM  [facility]   Office  [phone]     Person  Under Monitoring Name: Mitchell Quinn  Location: [address] [place] [address]   Infection Prevention Recommendations for Individuals Confirmed to have, or Being Evaluated for, 2019 Novel Coronavirus (COVID-19) Infection Who Receive Care at Home  Individuals who are confirmed to have, or are being evaluated for, COVID-19 should follow the prevention steps below until a healthcare provider or local or state health department says they can return to normal activities.  Stay home except to get medical care You should restrict activities outside your home, except for getting medical care. Do not go to work, school, or public areas, and do not use public transportation or taxis.  Call ahead before visiting your doctor Before your medical appointment, call the healthcare provider and tell them that you have, or are being evaluated for, COVID-19 infection. This will help the healthcare providers office take steps to keep other people from getting infected. Ask your healthcare provider  to call the local or state health department.  Monitor your symptoms Seek prompt medical attention if your illness is worsening (e.g., difficulty breathing). Before going to your medical appointment, call the healthcare provider and tell them that you have, or are being evaluated for, COVID-19 infection. Ask your healthcare provider to call the local or state health department.  Wear a facemask You should wear a facemask that covers your nose and mouth when you are in the same room with other people and when you visit a healthcare provider. People who live with or visit you should also wear a facemask while they are in the same room with you.  Separate yourself from other people in your home As much as possible, you should stay in a different room from other people in your home. Also, you should use a separate bathroom, if available.  Avoid sharing household items You should not share dishes,  drinking glasses, cups, eating utensils, towels, bedding, or other items with other people in your home. After using these items, you should wash them thoroughly with soap and water.  Cover your coughs and sneezes Cover your mouth and nose with a tissue when you cough or sneeze, or you can cough or sneeze into your sleeve. Throw used tissues in a lined trash can, and immediately wash your hands with soap and water for at least 20 seconds or use an alcohol-based hand rub.  Wash your [employer] Corporationhands Wash your hands often and thoroughly with soap and water for at least 20 seconds. You can use an alcohol-based hand sanitizer if soap and water are not available and if your hands are not visibly dirty. Avoid touching your eyes, nose, and mouth with unwashed hands.   Prevention Steps for Caregivers and Household Members of Individuals Confirmed to have, or Being Evaluated for, COVID-19 Infection Being Cared for in the Home  If you live with, or provide care at home for, a person confirmed to have, or being evaluated for, COVID-19 infection please follow these guidelines to prevent infection:  Follow healthcare providers instructions Make sure that you understand and can help the patient follow any healthcare provider instructions for all care.  Provide for the patients basic needs You should help the patient with basic needs in the home and provide support for getting groceries, prescriptions, and other personal needs.  Monitor the patients symptoms If they are getting sicker, call his or her medical provider and tell them that the patient has, or is being evaluated for, COVID-19 infection. This will help the healthcare providers office take steps to keep other people from getting infected. Ask the healthcare provider to call the local or state health department.  Limit the number of people who have contact with the patient  If possible, have only one caregiver for the patient.  Other  household members should stay in another home or place of residence. If this is not possible, they should stay  in another room, or be separated from the patient as much as possible. Use a separate bathroom, if available.  Restrict visitors who do not have an essential need to be in the home.  Keep older adults, very young children, and other sick people away from the patient Keep older adults, very young children, and those who have compromised immune systems or chronic health conditions away from the patient. This includes people with chronic heart, lung, or kidney conditions, diabetes, and cancer.  Ensure good ventilation Make sure that shared spaces in the home have  good air flow, such as from an air conditioner or an opened window, weather permitting.  Wash your hands often  Wash your hands often and thoroughly with soap and water for at least 20 seconds. You can use an alcohol based hand sanitizer if soap and water are not available and if your hands are not visibly dirty.  Avoid touching your eyes, nose, and mouth with unwashed hands.  Use disposable paper towels to dry your hands. If not available, use dedicated cloth towels and replace them when they become wet.  Wear a facemask and gloves  Wear a disposable facemask at all times in the room and gloves when you touch or have contact with the patients blood, body fluids, and/or secretions or excretions, such as sweat, saliva, sputum, nasal mucus, vomit, urine, or feces.  Ensure the mask fits over your nose and mouth tightly, and do not touch it during use.  Throw out disposable facemasks and gloves after using them. Do not reuse.  Wash your hands immediately after removing your facemask and gloves.  If your personal clothing becomes contaminated, carefully remove clothing and launder. Wash your hands after handling contaminated clothing.  Place all used disposable facemasks, gloves, and other waste in a lined container before  disposing them with other household waste.  Remove gloves and wash your hands immediately after handling these items.  Do not share dishes, glasses, or other household items with the patient  Avoid sharing household items. You should not share dishes, drinking glasses, cups, eating utensils, towels, bedding, or other items with a patient who is confirmed to have, or being evaluated for, COVID-19 infection.  After the person uses these items, you should wash them thoroughly with soap and water.  Wash laundry thoroughly  Immediately remove and wash clothes or bedding that have blood, body fluids, and/or secretions or excretions, such as sweat, saliva, sputum, nasal mucus, vomit, urine, or feces, on them.  Wear gloves when handling laundry from the patient.  Read and follow directions on labels of laundry or clothing items and detergent. In general, wash and dry with the warmest temperatures recommended on the label.  Clean all areas the individual has used often  Clean all touchable surfaces, such as counters, tabletops, doorknobs, bathroom fixtures, toilets, phones, keyboards, tablets, and bedside tables, every day. Also, clean any surfaces that may have blood, body fluids, and/or secretions or excretions on them.  Wear gloves when cleaning surfaces the patient has come in contact with.  Use a diluted bleach solution (e.g., dilute bleach with 1 part bleach and 10 parts water) or a household disinfectant with a label that says EPA-registered for coronaviruses. To make a bleach solution at home, add 1 tablespoon of bleach to 1 quart (4 cups) of water. For a larger supply, add  cup of bleach to 1 gallon (16 cups) of water.  Read labels of cleaning products and follow recommendations provided on product labels. Labels contain instructions for safe and effective use of the cleaning product including precautions you should take when applying the product, such as wearing gloves or eye protection  and making sure you have good ventilation during use of the product.  Remove gloves and wash hands immediately after cleaning.  Monitor yourself for signs and symptoms of illness Caregivers and household members are considered close contacts, should monitor their health, and will be asked to limit movement outside of the home to the extent possible. Follow the monitoring steps for close contacts listed on the symptom  monitoring form.   ? If you have additional questions, contact your local health department or call the epidemiologist on call at [phone] (available 24/7). ? This guidance is subject to change. For the most up-to-date guidance from [facility], please refer to their website: [email or web address]

## 2019-09-06 NOTE — Discharge Summary (Signed)
[name]:[id number] DOB: 06-14-1991 DOA: 09/02/2019  PCP: [facility]  Admit date: 09/02/2019  Discharge date: 09/06/2019  Admitted From: Home   Disposition:  Home   Recommendations for Outpatient Follow-up:   Follow up with PCP in 1-2 weeks  PCP Please obtain BMP/CBC, 2 view CXR in 1week,  (see Discharge instructions)   PCP Please follow up on the following pending results:    Home Health: None   Equipment/Devices: None  Consultations: None  Discharge Condition: Stable    CODE STATUS: Full    Diet Recommendation: Heart Healthy     Chief Complaint  Patient presents with   covid   Shortness of Breath     Brief history of present illness from the day of admission and additional interim summary    [name] a28 y.o.malewithout significant past medical history who was seen in urgent care on 08/25/2019 with fevers above 102 and respiratory symptoms---he was tested for COVID at the time,was found to bepositive for COVID-19 on8/26/2020test, he was at that time sent home he continued to run fevers subsequently developed some nausea vomiting and then few days later started developing some shortness of breath and cough.  Came to the ER on 09/02/2019 where he was diagnosed with COVID-19 pneumonia with hypoxic respiratory failure and admitted.                                                                 Hospital Course    Acute Hypoxic Resp. Failure due to Acute Covid 19 Viral Pneumonitis during the ongoing 2020 Covid 19 Pandemic - he was treated with combination of IV Remdisvir and steroids with good effect, he is now symptom-free on room air.   Will be discharged home on PRN albuterol inhaler once he finishes his IV Remdisvir today.  Inflammatory markers have stabilized.  Lung  sounds are back to baseline.  COVID-19 Labs  Recent Labs    09/04/19 0320 09/05/19 0228 09/06/19 0304  DDIMER 0.38 <0.27 0.45  FERRITIN 668* 657* 802*  LDH 277* 258* 290*  CRP 5.6* 2.5* 1.3*    Lab Results  Component Value Date   SARSCOV2NAA DETECTED (A) 08/25/2019   [place] Not Detected 08/02/2019    Hepatic Function Latest Ref Rng & Units 09/06/2019 09/05/2019 09/04/2019  Total Protein 6.5 - 8.1 g/dL 6.8 6.8 7.0  Albumin 3.5 - 5.0 g/dL 3.4(L) 3.2(L) 3.2(L)  AST 15 - 41 U/L 44(H) 31 44(H)  ALT 0 - 44 U/L 88(H) 63(H) 68(H)  Alk Phosphatase 38 - 126 U/L 66 68 69  Total Bilirubin 0.3 - 1.2 mg/dL 0.6 0.6 0.5     2.  Occasional smoking.  Counseled to quit.  3.  Mild COVID-19 related transaminitis.  Stable trend.  Asymptomatic.  PCP may repeat CMP in 7  to 10 days time.  4.  GERD.  Continue PPI.   Discharge diagnosis     Principal Problem:   Acute respiratory disease due to COVID-19 virus Active Problems:   Pneumonia due to COVID-19 virus   Respiratory Hypoxic failure, acute -Covid Related    Discharge instructions    Discharge Instructions    Diet - low sodium heart healthy   Complete by: As directed    Discharge instructions   Complete by: As directed    Follow with Primary MD [facility] in 7 days   Get CBC, CMP, 2 view Chest X ray -  checked next visit within 1 week by Primary MD   Activity: As tolerated with Full fall precautions use walker/cane & assistance as needed  Disposition Home    Diet: Heart Healthy    Special Instructions: If you have smoked or chewed Tobacco  in the last 2 yrs please stop smoking, stop any regular Alcohol  and or any Recreational drug use.  On your next visit with your primary care physician please Get Medicines reviewed and adjusted.  Please request your Prim.MD to go over all Hospital Tests and Procedure/Radiological results at the follow up, please get all Hospital records sent to your Prim MD by signing  hospital release before you go home.  If you experience worsening of your admission symptoms, develop shortness of breath, life threatening emergency, suicidal or homicidal thoughts you must seek medical attention immediately by calling 911 or calling your MD immediately  if symptoms less severe.  You Must read complete instructions/literature along with all the possible adverse reactions/side effects for all the Medicines you take and that have been prescribed to you. Take any new Medicines after you have completely understood and accpet all the possible adverse reactions/side effects.                                                                              [name]                            [name], [name] [address]      [name] was admitted to the Hospital on 09/02/2019 and Discharged  09/06/2019 and should be excused from work/school   for 21  days starting from date -  09/02/2019 , may return to work/school without any restrictions.  Call Mitchell Lund MD, [facility]  [phone] with questions.  Mitchell Quinn M.D on 09/06/2019,at 8:26 AM  [facility]   Office  [phone]   Increase activity slowly   Complete by: As directed       Discharge Medications   Allergies as of 09/06/2019   No Known Allergies     Medication List    STOP taking these medications   azithromycin 250 MG tablet Commonly known as: ZITHROMAX   ibuprofen 400 MG tablet Commonly known as: ADVIL     TAKE these medications   albuterol 108 (90 Base) MCG/ACT inhaler  Commonly known as: VENTOLIN HFA Inhale 2 puffs into the lungs every 4 (four) hours as needed for wheezing or shortness of breath.   albuterol 108 (90 Base) MCG/ACT inhaler Commonly known as: VENTOLIN HFA Inhale 2 puffs into the lungs every 6 (six) hours as needed for wheezing or shortness of breath.   benzonatate 100 MG capsule Commonly known as:  Tessalon Perles Take 1 capsule (100 mg total) by mouth 3 (three) times daily as needed.   dicyclomine 20 MG tablet Commonly known as: BENTYL Take 1 tablet (20 mg total) by mouth 2 (two) times daily.   famotidine 20 MG tablet Commonly known as: PEPCID Take 1 tablet (20 mg total) by mouth 2 (two) times daily.   hydrocortisone 25 MG suppository Commonly known as: ANUSOL-HC Place 1 suppository (25 mg total) rectally 2 (two) times daily.   ondansetron 4 MG disintegrating tablet Commonly known as: Zofran ODT Take 1 tablet (4 mg total) by mouth every 8 (eight) hours as needed for nausea or vomiting.       Follow-up [place]. Schedule an appointment as soon as possible for a visit in 1 week(s).   Contact information: [place] [address] [phone]           Major procedures and Radiology Reports - PLEASE review detailed and final reports thoroughly  -        Dg Chest Port 1 View  Result Date: 09/02/2019 CLINICAL DATA:  Cough and shortness of breath. Known COVID-19 infection. EXAM: PORTABLE CHEST 1 VIEW COMPARISON:  August 29, 2019 FINDINGS: Cardiomediastinal silhouette is normal. Mediastinal contours appear intact. There is no evidence of pleural effusion or pneumothorax. Bilateral patchy airspace consolidation with lower lobe predominance. Low lung volumes. Osseous structures are without acute abnormality. Soft tissues are grossly normal. IMPRESSION: 1. Bilateral patchy airspace consolidation with lower lobe predominance, consistent with atypical multifocal pneumonia. 2. Low lung volumes. Electronically Signed   By: Fidela Salisbury M.D.   On: 09/02/2019 12:26   Dg Chest Portable 1 View  Result Date: 08/29/2019 CLINICAL DATA:  Cough.  Dyspnea. EXAM: PORTABLE CHEST 1 VIEW COMPARISON:  May 01, 2019 FINDINGS: There is an airspace opacity at the right lung base, new since prior x-ray in May 2020. The heart size remains mildly  enlarged. There is no pneumothorax. No large pleural effusion. No acute osseous abnormality. IMPRESSION: 1. Airspace opacity at the right lung base concerning for infiltrate secondary to viral or bacterial pneumonia. 2. Borderline cardiomegaly. Electronically Signed   By: Constance Holster M.D.   On: 08/29/2019 16:57    Micro Results     Recent Results (from the past 240 hour(s))  Blood Culture (routine x 2)     Status: None (Preliminary result)   Collection Time: 09/02/19 11:29 AM   Specimen: Right Antecubital; Blood  Result Value Ref Range Status   Specimen Description RIGHT ANTECUBITAL  Final   Special Requests   Final    BOTTLES DRAWN AEROBIC AND ANAEROBIC Blood Culture adequate volume   Culture   Final    NO GROWTH 4 DAYS Performed at [facility], [address], [place], [place] [address]    Report Status PENDING  Incomplete  Blood Culture (routine x 2)     Status: None (Preliminary result)   Collection Time: 09/02/19 11:34 AM   Specimen: Left Antecubital; Blood  Result Value Ref Range Status   Specimen Description LEFT ANTECUBITAL  Final   Special  Requests   Final    BOTTLES DRAWN AEROBIC AND ANAEROBIC Blood Culture adequate volume   Culture   Final    NO GROWTH 4 DAYS Performed at [facility], [address], [place], [place] [address]    Report Status PENDING  Incomplete    Today   Subjective    Breion Novacek today has no headache,no chest abdominal pain,no new weakness tingling or numbness, feels much better wants to go home today.     Objective   Blood pressure 112/70, pulse 66, temperature 98.2 F (36.8 C), temperature source Oral, resp. rate 18, height 6' (1.829 m), weight 108.4 kg, SpO2 93 %.   Intake/Output Summary (Last 24 hours) at 09/06/2019 0828 Last data filed at 09/05/2019 1802 Gross per 24 hour  Intake 840 ml  Output 500 ml  Net 340 ml    Exam Awake Alert, Oriented x 3, No new F.N deficits, Normal affect [place].AT,PERRAL Supple Neck,No JVD, No  cervical lymphadenopathy appriciated.  Symmetrical Chest wall movement, Good air movement bilaterally, CTAB RRR,No Gallops,Rubs or new Murmurs, No Parasternal Heave +ve B.Sounds, Abd Soft, Non tender, No organomegaly appriciated, No rebound -guarding or rigidity. No Cyanosis, Clubbing or edema, No new Rash or bruise   Data Review   CBC w Diff:  Lab Results  Component Value Date   WBC 16.0 (H) 09/06/2019   HGB 13.5 09/06/2019   HCT 40.0 09/06/2019   PLT 460 (H) 09/06/2019   LYMPHOPCT 11 09/06/2019   MONOPCT 6 09/06/2019   EOSPCT 0 09/06/2019   BASOPCT 0 09/06/2019    CMP:  Lab Results  Component Value Date   NA 140 09/06/2019   K 4.7 09/06/2019   CL 103 09/06/2019   CO2 23 09/06/2019   BUN 18 09/06/2019   CREATININE 0.73 09/06/2019   PROT 6.8 09/06/2019   ALBUMIN 3.4 (L) 09/06/2019   BILITOT 0.6 09/06/2019   ALKPHOS 66 09/06/2019   AST 44 (H) 09/06/2019   ALT 88 (H) 09/06/2019  .   Total Time in preparing paper work, data evaluation and todays exam - 1 minutes  Mitchell Quinn M.D on 09/06/2019 at 8:28 AM  [facility]   Office  [phone]

## 2019-09-07 LAB — CULTURE, BLOOD (ROUTINE X 2)
Culture: NO GROWTH
Culture: NO GROWTH
Special Requests: ADEQUATE
Special Requests: ADEQUATE

## 2020-01-03 ENCOUNTER — Other Ambulatory Visit: Payer: Managed Care, Other (non HMO)

## 2020-06-28 ENCOUNTER — Emergency Department (HOSPITAL_COMMUNITY)
Admission: EM | Admit: 2020-06-28 | Discharge: 2020-06-29 | Disposition: A | Discharge status: Home / Self Care | Payer: Managed Care, Other (non HMO) | Attending: Emergency Medicine | Admitting: Emergency Medicine

## 2020-06-28 ENCOUNTER — Other Ambulatory Visit: Payer: Self-pay

## 2020-06-28 ENCOUNTER — Encounter (HOSPITAL_COMMUNITY): Payer: Self-pay | Admitting: Emergency Medicine

## 2020-06-28 ENCOUNTER — Emergency Department (HOSPITAL_COMMUNITY): Payer: Managed Care, Other (non HMO)

## 2020-06-28 DIAGNOSIS — Z5321 Procedure and treatment not carried out due to patient leaving prior to being seen by health care provider: Secondary | ICD-10-CM | POA: Insufficient documentation

## 2020-06-28 DIAGNOSIS — R0789 Other chest pain: Secondary | ICD-10-CM | POA: Diagnosis present

## 2020-06-28 LAB — CBC
HCT: 41.7 % (ref 39.0–52.0)
Hemoglobin: 14.7 g/dL (ref 13.0–17.0)
MCH: 30.9 pg (ref 26.0–34.0)
MCHC: 35.3 g/dL (ref 30.0–36.0)
MCV: 87.6 fL (ref 80.0–100.0)
Platelets: 314 10*3/uL (ref 150–400)
RBC: 4.76 MIL/uL (ref 4.22–5.81)
RDW: 11.8 % (ref 11.5–15.5)
WBC: 9.5 10*3/uL (ref 4.0–10.5)
nRBC: 0 % (ref 0.0–0.2)

## 2020-06-28 LAB — BASIC METABOLIC PANEL
Anion gap: 10 (ref 5–15)
BUN: 7 mg/dL (ref 6–20)
CO2: 24 mmol/L (ref 22–32)
Calcium: 9.3 mg/dL (ref 8.9–10.3)
Chloride: 105 mmol/L (ref 98–111)
Creatinine, Ser: 0.92 mg/dL (ref 0.61–1.24)
GFR calc Af Amer: >60 mL/min (ref >60)
GFR calc non Af Amer: >60 mL/min (ref >60)
Glucose, Bld: 97 mg/dL (ref 70–99)
Potassium: 3.9 mmol/L (ref 3.5–5.1)
Sodium: 139 mmol/L (ref 135–145)

## 2020-06-28 LAB — TROPONIN I (HIGH SENSITIVITY)
Troponin I (High Sensitivity): 5 ng/L (ref <18)
Troponin I (High Sensitivity): 4 ng/L (ref <18)

## 2020-06-28 MED ORDER — SODIUM CHLORIDE 0.9% FLUSH: 3.0000 mL | Freq: Once | INTRAVENOUS | Status: DC

## 2020-06-28 NOTE — ED Triage Notes (Signed)
Pt c/o right sided chest pain that radiates to his right shoulder, a headache behind his right eye and intermittent numbness to his right hand that started this morning.

## 2020-06-29 NOTE — ED Notes (Signed)
Pt leaving AMA. Pt has an appointment in am and checking his MyChart.

## 2021-01-11 ENCOUNTER — Ambulatory Visit
Admission: EM | Admit: 2021-01-11 | Discharge: 2021-01-11 | Disposition: A | Discharge status: Home / Self Care | Payer: Managed Care, Other (non HMO) | Attending: Family Medicine | Admitting: Family Medicine

## 2021-01-11 ENCOUNTER — Other Ambulatory Visit: Payer: Self-pay

## 2021-01-11 ENCOUNTER — Encounter: Payer: Self-pay | Admitting: Emergency Medicine

## 2021-01-11 ENCOUNTER — Ambulatory Visit (INDEPENDENT_AMBULATORY_CARE_PROVIDER_SITE_OTHER): Payer: Managed Care, Other (non HMO)

## 2021-01-11 DIAGNOSIS — R059 Cough, unspecified: Secondary | ICD-10-CM

## 2021-01-11 DIAGNOSIS — R0789 Other chest pain: Secondary | ICD-10-CM

## 2021-01-11 DIAGNOSIS — B349 Viral infection, unspecified: Secondary | ICD-10-CM | POA: Diagnosis not present

## 2021-01-11 DIAGNOSIS — R0981 Nasal congestion: Secondary | ICD-10-CM | POA: Diagnosis not present

## 2021-01-11 DIAGNOSIS — R0602 Shortness of breath: Secondary | ICD-10-CM

## 2021-01-11 DIAGNOSIS — R5383 Other fatigue: Secondary | ICD-10-CM

## 2021-01-11 NOTE — Discharge Instructions (Addendum)
Chest xray was negative for pneumonia today  Push fluids and get some rest  Ibuprofen and tylenol as needed for aches and fever  Follow up with this office or with primary care if symptoms are persisting.  Follow up in the ER for high fever, trouble swallowing, trouble breathing, other concerning symptoms.

## 2021-01-11 NOTE — ED Provider Notes (Signed)
[facility]   [id number] 01/11/21 Arrival Time: 1702   CC: COVID symptoms  SUBJECTIVE: History from: patient.  Mitchell Quinn is a 30 y.o. male who presents with SOB and right sided chest pain with exertion and movement, headaches x 2 days. Had negative rapid home covid test. Declines covid testing today. Denies sick exposure to COVID, flu or strep. Denies recent travel. Has positive history of Covid. States that he was hospitalized with Covid with pneumonia when he had it in 08/2019. Has completed Covid vaccines. Has not taken OTC medications for this. Denies previous symptoms in the past. Denies fever, chills, fatigue, sinus pain, rhinorrhea, sore throat, wheezing, nausea, changes in bowel or bladder habits.    ROS: As per HPI.  All other pertinent ROS negative.     Past Medical History:  Diagnosis Date  . IBS (irritable bowel syndrome)   . Pneumonia    Past Surgical History:  Procedure Laterality Date  . ORIF WRIST FRACTURE     No Known Allergies No current facility-administered medications on file prior to encounter.   Current Outpatient Medications on File Prior to Encounter  Medication Sig Dispense Refill  . albuterol (PROVENTIL HFA;VENTOLIN HFA) 108 (90 Base) MCG/ACT inhaler Inhale 2 puffs into the lungs every 4 (four) hours as needed for wheezing or shortness of breath. (Patient not taking: Reported on 08/29/2019) 1 Inhaler 1  . albuterol (VENTOLIN HFA) 108 (90 Base) MCG/ACT inhaler Inhale 2 puffs into the lungs every 6 (six) hours as needed for wheezing or shortness of breath. 18 g 0  . benzonatate (TESSALON PERLES) 100 MG capsule Take 1 capsule (100 mg total) by mouth 3 (three) times daily as needed. (Patient not taking: Reported on 08/29/2019) 20 capsule 0  . dicyclomine (BENTYL) 20 MG tablet Take 1 tablet (20 mg total) by mouth 2 (two) times daily. (Patient not taking: Reported on 08/29/2019) 20 tablet 0  . famotidine (PEPCID) 20 MG tablet Take 1 tablet (20 mg  total) by mouth 2 (two) times daily. 15 tablet 0  . hydrocortisone (ANUSOL-HC) 25 MG suppository Place 1 suppository (25 mg total) rectally 2 (two) times daily. (Patient not taking: Reported on 08/29/2019) 12 suppository 0  . ondansetron (ZOFRAN ODT) 4 MG disintegrating tablet Take 1 tablet (4 mg total) by mouth every 8 (eight) hours as needed for nausea or vomiting. 5 tablet 0   Social History   Socioeconomic History  . Marital status: Married    Spouse name: Not on file  . Number of children: Not on file  . Years of education: Not on file  . Highest education level: Not on file  Occupational History  . Not on file  Tobacco Use  . Smoking status: Never Smoker  . Smokeless tobacco: Current User    Types: Snuff  Vaping Use  . Vaping Use: Former  Substance and Sexual Activity  . Alcohol use: No  . Drug use: Never  . Sexual activity: Not on file  Other Topics Concern  . Not on file  Social History Narrative  . Not on file   Social Determinants of Health   Financial Resource Strain: Not on file  Food Insecurity: Not on file  Transportation Needs: Not on file  Physical Activity: Not on file  Stress: Not on file  Social Connections: Not on file  Intimate Partner Violence: Not on file   Family History  Problem Relation Age of Onset  . Healthy Mother   . Healthy Father  OBJECTIVE:  Vitals:   01/11/21 1821 01/11/21 1824  BP: (!) 141/88   Pulse: 73   Resp: 16   Temp: 98.1 F (36.7 C)   TempSrc: Oral   SpO2: 96%   Weight:  262 lb (118.8 kg)  Height:  5\' 11"  (1.803 m)     General appearance: alert; appears fatigued, but nontoxic; speaking in full sentences and tolerating own secretions HEENT: NCAT; Ears: EACs clear, TMs pearly gray; Eyes: PERRL.  EOM grossly intact. Sinuses: nontender; Nose: nares patent with clear rhinorrhea, Throat: oropharynx erythematous, cobblestoning present, tonsils non erythematous or enlarged, uvula midline  Neck: supple without  LAD Lungs: unlabored respirations, symmetrical air entry; cough: mild; no respiratory distress; diminished lung sounds to bilateral lower lobes Heart: regular rate and rhythm.  Radial pulses 2+ symmetrical bilaterally Skin: warm and dry Psychological: alert and cooperative; normal mood and affect  LABS:  No results found for this or any previous visit (from the past 24 hour(s)).   ASSESSMENT & PLAN:  1. Viral illness   2. SOB (shortness of breath)   3. Cough   4. Nasal congestion   5. Other fatigue   6. Chest discomfort     Chest xray negative today Continue supportive care at home Declines COVID and flu testing  Work note provided Get plenty of rest and push fluids Use OTC zyrtec for nasal congestion, runny nose, and/or sore throat Use OTC flonase for nasal congestion and runny nose Use medications daily for symptom relief Use OTC medications like ibuprofen or tylenol as needed fever or pain Call or go to the ED if you have any new or worsening symptoms such as fever, worsening cough, shortness of breath, chest tightness, chest pain, turning blue, changes in mental status.  Reviewed expectations re: course of current medical issues. Questions answered. Outlined signs and symptoms indicating need for more acute intervention. Patient verbalized understanding. After Visit Summary given.         , NP 01/14/21 1349

## 2021-01-11 NOTE — ED Triage Notes (Signed)
Took home covid test last night and it was negative  Is having SOB and pain on right side of chest on exersion.  headaches

## 2021-02-11 ENCOUNTER — Ambulatory Visit
Admission: EM | Admit: 2021-02-11 | Discharge: 2021-02-11 | Disposition: A | Discharge status: Home / Self Care | Payer: Managed Care, Other (non HMO) | Attending: Emergency Medicine | Admitting: Emergency Medicine

## 2021-02-11 ENCOUNTER — Other Ambulatory Visit: Payer: Self-pay

## 2021-02-11 ENCOUNTER — Encounter: Payer: Self-pay | Admitting: Emergency Medicine

## 2021-02-11 DIAGNOSIS — S0101XA Laceration without foreign body of scalp, initial encounter: Secondary | ICD-10-CM | POA: Diagnosis not present

## 2021-02-11 DIAGNOSIS — S0990XA Unspecified injury of head, initial encounter: Secondary | ICD-10-CM

## 2021-02-11 MED ORDER — MUPIROCIN 2 % EX OINT: 1.0000 "application " | TOPICAL_OINTMENT | Freq: Two times a day (BID) | CUTANEOUS | 0 refills | Status: DC

## 2021-02-11 NOTE — ED Provider Notes (Signed)
[facility]   [id number] 02/11/21 Arrival Time: 7564   Chief Complaint  Patient presents with  . Laceration  . hitting head against object     SUBJECTIVE: History from: patient.  Mitchell Quinn is a 30 y.o. male presented to the urgent care with a complaint of hitting his head against a table at work.  Developed the symptom while lifting an object at work and getting up.  He reports he was dizzy and nauseous right after but symptom is now resolved.  Localized pain to the generalized head and laceration to the occipital head.  He describes the pain as constant and achy.  He has tried OTC medications without relief.  Denies alleviating or aggravating factors.  He denies similar symptoms in the past.  Denies chills, fever, worsening headache, diplopia, blurry vision, LOC, projectile vomiting.  ROS: As per HPI.  All other pertinent ROS negative.      Past Medical History:  Diagnosis Date  . IBS (irritable bowel syndrome)   . Pneumonia    Past Surgical History:  Procedure Laterality Date  . ORIF WRIST FRACTURE     No Known Allergies No current facility-administered medications on file prior to encounter.   Current Outpatient Medications on File Prior to Encounter  Medication Sig Dispense Refill  . albuterol (PROVENTIL HFA;VENTOLIN HFA) 108 (90 Base) MCG/ACT inhaler Inhale 2 puffs into the lungs every 4 (four) hours as needed for wheezing or shortness of breath. (Patient not taking: Reported on 08/29/2019) 1 Inhaler 1  . albuterol (VENTOLIN HFA) 108 (90 Base) MCG/ACT inhaler Inhale 2 puffs into the lungs every 6 (six) hours as needed for wheezing or shortness of breath. 18 g 0  . benzonatate (TESSALON PERLES) 100 MG capsule Take 1 capsule (100 mg total) by mouth 3 (three) times daily as needed. (Patient not taking: Reported on 08/29/2019) 20 capsule 0  . dicyclomine (BENTYL) 20 MG tablet Take 1 tablet (20 mg total) by mouth 2 (two) times daily. (Patient not taking:  Reported on 08/29/2019) 20 tablet 0  . famotidine (PEPCID) 20 MG tablet Take 1 tablet (20 mg total) by mouth 2 (two) times daily. 15 tablet 0  . hydrocortisone (ANUSOL-HC) 25 MG suppository Place 1 suppository (25 mg total) rectally 2 (two) times daily. (Patient not taking: Reported on 08/29/2019) 12 suppository 0  . ondansetron (ZOFRAN ODT) 4 MG disintegrating tablet Take 1 tablet (4 mg total) by mouth every 8 (eight) hours as needed for nausea or vomiting. 5 tablet 0   Social History   Socioeconomic History  . Marital status: Married    Spouse name: Not on file  . Number of children: Not on file  . Years of education: Not on file  . Highest education level: Not on file  Occupational History  . Not on file  Tobacco Use  . Smoking status: Never Smoker  . Smokeless tobacco: Current User    Types: Snuff  Vaping Use  . Vaping Use: Former  Substance and Sexual Activity  . Alcohol use: No  . Drug use: Never  . Sexual activity: Not on file  Other Topics Concern  . Not on file  Social History Narrative  . Not on file   Social Determinants of Health   Financial Resource Strain: Not on file  Food Insecurity: Not on file  Transportation Needs: Not on file  Physical Activity: Not on file  Stress: Not on file  Social Connections: Not on file  Intimate Partner Violence: Not  on file   Family History  Problem Relation Age of Onset  . Healthy Mother   . Healthy Father     OBJECTIVE:  Vitals:   02/11/21 0843  BP: 126/87  Pulse: 80  Resp: 18  Temp: 98.2 F (36.8 C)  TempSrc: Oral  SpO2: 96%     Physical Exam Vitals and nursing note reviewed.  Constitutional:      General: He is not in acute distress.    Appearance: Normal appearance. He is normal weight. He is not ill-appearing, toxic-appearing or diaphoretic.  Cardiovascular:     Rate and Rhythm: Normal rate and regular rhythm.     Pulses: Normal pulses.     Heart sounds: Normal heart sounds. No murmur heard. No  friction rub. No gallop.   Pulmonary:     Effort: Pulmonary effort is normal. No respiratory distress.     Breath sounds: Normal breath sounds. No stridor. No wheezing, rhonchi or rales.  Chest:     Chest wall: No tenderness.  Skin:    Findings: Laceration present.     Comments: Laceration of scalp located  on occipital lobe  Neurological:     General: No focal deficit present.     Mental Status: He is alert and oriented to person, place, and time.     GCS: GCS eye subscore is 4. GCS verbal subscore is 5. GCS motor subscore is 6.     Cranial Nerves: Cranial nerves are intact.     Sensory: Sensation is intact.     Motor: Motor function is intact.     Coordination: Coordination is intact.     Gait: Gait is intact.     LABS:  No results found for this or any previous visit (from the past 24 hour(s)).   ASSESSMENT & PLAN:  1. Laceration of scalp without foreign body, initial encounter   2. Injury of head, initial encounter     Meds ordered this encounter  Medications  . mupirocin ointment (BACTROBAN) 2 %    Sig: Apply 1 application topically 2 (two) times daily.    Dispense:  22 g    Refill:  0   Patient is stable at discharge.  He was advised to go to ER for further evaluation with a CT scan.  Patient declined at this time.  Discharge instructions  Keep laceration clean with warm water and mild soap Apply thin layer of Bactroban daily Follow with PCP Go to ER if you develop any new or worsening symptoms such as worsening headache of your life, projectile vomiting, LOC, confusion, blurry vision, double vision, or any other neurological symptoms  Reviewed expectations re: course of current medical issues. Questions answered. Outlined signs and symptoms indicating need for more acute intervention. Patient verbalized understanding. After Visit Summary given.         Durward Parcel, FNP 02/11/21 [phone]

## 2021-02-11 NOTE — Discharge Instructions (Signed)
Keep laceration clean with warm water and mild soap Apply thin layer of Bactroban daily Follow with PCP Go to ER if you develop any new or worsening symptoms such as worsening headache of your life, projectile vomiting, LOC, confusion, blurry vision, double vision, or any other neurological symptoms

## 2021-02-11 NOTE — ED Triage Notes (Signed)
Pt fell at work last night around 11pm and hit top of his head.  States he felt dizzy and had some mild nausea afterwards.  Pt has lac to top of head with no active bleeding.  Pt is having no further dizziness or nausea at this time.  No neurological deficits observed.

## 2021-03-15 ENCOUNTER — Other Ambulatory Visit: Payer: Self-pay

## 2021-03-15 ENCOUNTER — Encounter: Payer: Self-pay | Admitting: Emergency Medicine

## 2021-03-15 ENCOUNTER — Ambulatory Visit
Admission: EM | Admit: 2021-03-15 | Discharge: 2021-03-15 | Disposition: A | Discharge status: Home / Self Care | Payer: Managed Care, Other (non HMO) | Attending: Family Medicine | Admitting: Family Medicine

## 2021-03-15 DIAGNOSIS — J0110 Acute frontal sinusitis, unspecified: Secondary | ICD-10-CM

## 2021-03-15 MED ORDER — AMOXICILLIN 875 MG PO TABS: 875.0000 mg | ORAL_TABLET | Freq: Two times a day (BID) | ORAL | 0 refills | Status: AC

## 2021-03-15 NOTE — ED Provider Notes (Signed)
  [facility]   [id number] 03/15/21 Arrival Time: 1803  ASSESSMENT & PLAN:  1. Acute non-recurrent frontal sinusitis     Begin: Meds ordered this encounter  Medications  . amoxicillin (AMOXIL) 875 MG tablet    Sig: Take 1 tablet (875 mg total) by mouth 2 (two) times daily for 10 days.    Dispense:  20 tablet    Refill:  0    Discussed typical duration of symptoms. OTC symptom care as needed. Ensure adequate fluid intake and rest.   Follow-up Information    [facility].   Specialty: Urgent Care Why: If worsening or failing to improve as anticipated. Contact information: [address] [place] [address] [phone]              Reviewed expectations re: course of current medical issues. Questions answered. Outlined signs and symptoms indicating need for more acute intervention. Patient verbalized understanding. After Visit Summary given.   SUBJECTIVE: History from: patient.  Mitchell Quinn is a 30 y.o. male who presents with complaint of nasal congestion, post-nasal drainage, and sinus pain. Onset gradual, approx 7-10 d. Respiratory symptoms: none. Fever: absent. Overall normal PO intake without n/v. OTC treatment: none. Seasonal allergies: questions. History of frequent sinus infections: no. No specific aggravating or alleviating factors reported. Social History   Tobacco Use  Smoking Status Never Smoker  Smokeless Tobacco Current User  . Types: Snuff     OBJECTIVE:  Vitals:   03/15/21 1812  BP: (!) 128/94  Pulse: 75  Resp: 19  Temp: 97.7 F (36.5 C)  TempSrc: Oral  SpO2: 96%     General appearance: alert; no distress HEENT: nasal congestion; clear runny nose; throat irritation secondary to post-nasal drainage; bilateral frontal tenderness to palpation; turbinates boggy Neck: supple without LAD; trachea midline Lungs: unlabored respirations, symmetrical air entry; cough:  absent; no respiratory distress Skin: warm and dry Psychological: alert and cooperative; normal mood and affect  No Known Allergies  Past Medical History:  Diagnosis Date  . IBS (irritable bowel syndrome)   . Pneumonia    Family History  Problem Relation Age of Onset  . Healthy Mother   . Healthy Father    Social History   Socioeconomic History  . Marital status: Married    Spouse name: Not on file  . Number of children: Not on file  . Years of education: Not on file  . Highest education level: Not on file  Occupational History  . Not on file  Tobacco Use  . Smoking status: Never Smoker  . Smokeless tobacco: Current User    Types: Snuff  Vaping Use  . Vaping Use: Former  Substance and Sexual Activity  . Alcohol use: No  . Drug use: Never  . Sexual activity: Not on file  Other Topics Concern  . Not on file  Social History Narrative  . Not on file   Social Determinants of Health   Financial Resource Strain: Not on file  Food Insecurity: Not on file  Transportation Needs: Not on file  Physical Activity: Not on file  Stress: Not on file  Social Connections: Not on file  Intimate Partner Violence: Not on file            Mardella Layman, MD 03/15/21 Barry Brunner

## 2021-03-15 NOTE — ED Triage Notes (Signed)
Headache, sore throat, eye pain, RT ear pain. S/s started last Saturday.  Had neg covid test since s/s began.

## 2021-09-11 ENCOUNTER — Ambulatory Visit
Admission: EM | Admit: 2021-09-11 | Discharge: 2021-09-11 | Disposition: A | Discharge status: Home / Self Care | Payer: Managed Care, Other (non HMO) | Attending: Internal Medicine | Admitting: Internal Medicine

## 2021-09-11 ENCOUNTER — Other Ambulatory Visit: Payer: Self-pay

## 2021-09-11 DIAGNOSIS — U071 COVID-19: Secondary | ICD-10-CM

## 2021-09-11 MED ORDER — ALBUTEROL SULFATE HFA 108 (90 BASE) MCG/ACT IN AERS
2.0000 | INHALATION_SPRAY | RESPIRATORY_TRACT | 0 refills | Status: DC | PRN
Start: 2021-09-11 — End: 2023-12-05

## 2021-09-11 NOTE — Discharge Instructions (Addendum)
Use medications as prescribed I think you are going to continue to improve in the coming days Your lung exam is unremarkable Return to urgent care if you have any worsening symptoms You do not qualify for antiviral medication.

## 2021-09-11 NOTE — ED Triage Notes (Signed)
Pt tested positive for covid on Saturday, pt concerned because he was admitted to hospital last time he had covid

## 2021-09-11 NOTE — ED Provider Notes (Signed)
[facility]    CSN: [id number] Arrival date & time: 09/11/21  1012      History   Chief Complaint Chief Complaint  Patient presents with   Covid Positive    HPI Mitchell Quinn is a 30 y.o. male with irritable bowel syndrome comes to urgent care with a 5-day history of nasal congestion, cough.  Patient tested positive for COVID 3 days ago.  His symptoms have been mild.  He started having some wheezing today.  No fever or chills.  His pulse oximetry is 94% with respirations of 20.  No sputum production.  He denies any dizziness near syncope or syncopal episodes.  Patient is fully vaccinated against COVID-19 virus.  He had COVID-19 infection in January 2020. HPI  Past Medical History:  Diagnosis Date   IBS (irritable bowel syndrome)    Pneumonia     Patient Active Problem List   Diagnosis Date Noted   Acute respiratory disease due to COVID-19 virus 09/02/2019   Pneumonia due to COVID-19 virus 09/02/2019   Respiratory Hypoxic failure, acute -Covid Related 09/02/2019   IRRITABLE BOWEL SYNDROME 10/23/2010   FOLLICULITIS 10/23/2010   ABDOMINAL PAIN 10/03/2010   DIARRHEA 09/20/2010   Sprain of ankle, unspecified site 03/26/2010    Past Surgical History:  Procedure Laterality Date   ORIF WRIST FRACTURE         Home Medications    Prior to Admission medications   Medication Sig Start Date End Date Taking? Authorizing Provider  albuterol (VENTOLIN HFA) 108 (90 Base) MCG/ACT inhaler Inhale 2 puffs into the lungs every 4 (four) hours as needed for wheezing or shortness of breath. 09/11/21   Merrilee Jansky, MD  famotidine (PEPCID) 20 MG tablet Take 1 tablet (20 mg total) by mouth 2 (two) times daily. 08/29/19   Petrucelli, Samantha R, PA-C  mupirocin ointment (BACTROBAN) 2 % Apply 1 application topically 2 (two) times daily. 02/11/21   Avegno, Zachery Dakins, FNP  ondansetron (ZOFRAN ODT) 4 MG disintegrating tablet Take 1 tablet (4 mg total) by mouth every 8 (eight)  hours as needed for nausea or vomiting. 08/29/19   Petrucelli, Pleas Koch, PA-C    Family History Family History  Problem Relation Age of Onset   Healthy Mother    Healthy Father     Social History Social History   Tobacco Use   Smoking status: Never   Smokeless tobacco: Current    Types: Snuff  Vaping Use   Vaping Use: Former  Substance Use Topics   Alcohol use: No   Drug use: Never     Allergies   Patient has no known allergies.   Review of Systems Review of Systems  Constitutional: Negative.   HENT:  Positive for congestion. Negative for postnasal drip, sore throat and voice change.   Respiratory:  Positive for cough and chest tightness. Negative for shortness of breath and wheezing.   Cardiovascular: Negative.  Negative for chest pain and palpitations.  Gastrointestinal: Negative.   Neurological: Negative.     Physical Exam Triage Vital Signs ED Triage Vitals  Enc Vitals Group     BP 09/11/21 1056 114/83     Pulse Rate 09/11/21 1056 87     Resp 09/11/21 1056 20     Temp 09/11/21 1056 98 F (36.7 C)     Temp src --      SpO2 09/11/21 1056 94 %     Weight --      Height --  Head Circumference --      Peak Flow --      Pain Score 09/11/21 1053 0     Pain Loc --      Pain Edu? --      Excl. in [place]? --    No data found.  Updated Vital Signs BP 114/83   Pulse 87   Temp 98 F (36.7 C)   Resp 20   SpO2 94%   Visual Acuity Right Eye Distance:   Left Eye Distance:   Bilateral Distance:    Right Eye Near:   Left Eye Near:    Bilateral Near:     Physical Exam Vitals and nursing note reviewed.  Constitutional:      General: He is not in acute distress.    Appearance: He is not ill-appearing.  HENT:     Right Ear: Tympanic membrane normal.     Left Ear: Tympanic membrane normal.  Cardiovascular:     Rate and Rhythm: Normal rate and regular rhythm.     Pulses: Normal pulses.     Heart sounds: Normal heart sounds.  Pulmonary:      Effort: Pulmonary effort is normal.     Breath sounds: Normal breath sounds.  Abdominal:     General: Bowel sounds are normal.     Palpations: Abdomen is soft.  Musculoskeletal:        General: Normal range of motion.  Neurological:     Mental Status: He is alert.     UC Treatments / Results  Labs (all labs ordered are listed, but only abnormal results are displayed) Labs Reviewed - No data to display  EKG   Radiology No results found.  Procedures Procedures (including critical care time)  Medications Ordered in UC Medications - No data to display  Initial Impression / Assessment and Plan / UC Course  I have reviewed the triage vital signs and the nursing notes.  Pertinent labs & imaging results that were available during my care of the patient were reviewed by me and considered in my medical decision making (see chart for details).     1.  COVID-19 infection: Albuterol inhaler as needed for shortness of breath/wheezing Tylenol/Motrin as needed for pain and/or fever Maintain adequate hydration If you have worsening symptoms please return to the urgent care Is no indication for antiviral medication at this time. Final Clinical Impressions(s) / UC Diagnoses   Final diagnoses:  COVID-19 virus infection     Discharge Instructions      Use medications as prescribed I think you are going to continue to improve in the coming days Your lung exam is unremarkable Return to urgent care if you have any worsening symptoms You do not qualify for antiviral medication.     ED Prescriptions     Medication Sig Dispense Auth. Provider   albuterol (VENTOLIN HFA) 108 (90 Base) MCG/ACT inhaler Inhale 2 puffs into the lungs every 4 (four) hours as needed for wheezing or shortness of breath. 1 each Saory Carriero, Britta Mccreedy, MD      PDMP not reviewed this encounter.   Merrilee Jansky, MD 09/11/21 1144

## 2022-01-31 ENCOUNTER — Other Ambulatory Visit: Payer: Self-pay

## 2022-01-31 ENCOUNTER — Ambulatory Visit (HOSPITAL_COMMUNITY)
Admission: EM | Admit: 2022-01-31 | Discharge: 2022-01-31 | Disposition: A | Discharge status: Home / Self Care | Payer: Managed Care, Other (non HMO) | Attending: Family Medicine | Admitting: Family Medicine

## 2022-01-31 ENCOUNTER — Telehealth (HOSPITAL_COMMUNITY): Payer: Self-pay | Admitting: Family Medicine

## 2022-01-31 ENCOUNTER — Encounter (HOSPITAL_COMMUNITY): Payer: Self-pay | Admitting: Emergency Medicine

## 2022-01-31 DIAGNOSIS — R052 Subacute cough: Secondary | ICD-10-CM | POA: Insufficient documentation

## 2022-01-31 DIAGNOSIS — Z20822 Contact with and (suspected) exposure to covid-19: Secondary | ICD-10-CM | POA: Diagnosis not present

## 2022-01-31 DIAGNOSIS — J029 Acute pharyngitis, unspecified: Secondary | ICD-10-CM

## 2022-01-31 DIAGNOSIS — Z8616 Personal history of COVID-19: Secondary | ICD-10-CM | POA: Insufficient documentation

## 2022-01-31 DIAGNOSIS — J0120 Acute ethmoidal sinusitis, unspecified: Secondary | ICD-10-CM

## 2022-01-31 LAB — SARS CORONAVIRUS 2 (TAT 6-24 HRS): SARS Coronavirus 2: NEGATIVE

## 2022-01-31 MED ORDER — AZITHROMYCIN 250 MG PO TABS: 250.0000 mg | ORAL_TABLET | Freq: Every day | ORAL | 0 refills | Status: DC

## 2022-01-31 NOTE — ED Triage Notes (Signed)
Patient has sore throat and pressure in head and eyes, feels weak.  Lungs feel inflamed.  Symptoms started 2 days ago

## 2022-01-31 NOTE — ED Provider Notes (Signed)
[place]    CSN: [id number] Arrival date & time: 01/31/22  1341      History   Chief Complaint Chief Complaint  Patient presents with   Sore Throat    HPI Mitchell Quinn is a 31 y.o. male.   Patient is here for sore throat, pressure in his head, mild difficulty breathing.  Feeling weak.  Symptoms started 2 days ago.  No fevers, no chills.  + cough, dark green mucous.  No otc meds used.  No known sick contacts.  He did have covid in sept, and this does not feel similar.   Past Medical History:  Diagnosis Date   IBS (irritable bowel syndrome)    Pneumonia     Patient Active Problem List   Diagnosis Date Noted   Acute respiratory disease due to COVID-19 virus 09/02/2019   Pneumonia due to COVID-19 virus 09/02/2019   Respiratory Hypoxic failure, acute -Covid Related 09/02/2019   IRRITABLE BOWEL SYNDROME [id number]   FOLLICULITIS [id number]   ABDOMINAL PAIN 10/03/2010   DIARRHEA 09/20/2010   Sprain of ankle, unspecified site 03/26/2010    Past Surgical History:  Procedure Laterality Date   ORIF WRIST FRACTURE         Home Medications    Prior to Admission medications   Medication Sig Start Date End Date Taking? Authorizing Provider  albuterol (VENTOLIN HFA) 108 (90 Base) MCG/ACT inhaler Inhale 2 puffs into the lungs every 4 (four) hours as needed for wheezing or shortness of breath. 09/11/21   Chase Picket, MD  famotidine (PEPCID) 20 MG tablet Take 1 tablet (20 mg total) by mouth 2 (two) times daily. Patient not taking: Reported on 01/31/2022 08/29/19   Petrucelli, Glynda Jaeger, PA-C  mupirocin ointment (BACTROBAN) 2 % Apply 1 application topically 2 (two) times daily. Patient not taking: Reported on 01/31/2022 02/11/21   Emerson Monte, FNP  ondansetron (ZOFRAN ODT) 4 MG disintegrating tablet Take 1 tablet (4 mg total) by mouth every 8 (eight) hours as needed for nausea or vomiting. 08/29/19   Petrucelli, Glynda Jaeger, PA-C    Family  History Family History  Problem Relation Age of Onset   Healthy Mother    Healthy Father     Social History Social History   Tobacco Use   Smoking status: Some Days    Types: Cigarettes   Smokeless tobacco: Former    Types: Snuff  Vaping Use   Vaping Use: Some days  Substance Use Topics   Alcohol use: No   Drug use: Never     Allergies   Patient has no known allergies.   Review of Systems Review of Systems  Constitutional:  Negative for chills and fever.  HENT:  Positive for congestion, ear pain, rhinorrhea, sinus pressure, sinus pain and sore throat.   Eyes: Negative.   Respiratory:  Positive for cough and shortness of breath. Negative for wheezing.   Cardiovascular: Negative.   Gastrointestinal: Negative.     Physical Exam Triage Vital Signs ED Triage Vitals  Enc Vitals Group     BP 01/31/22 1511 128/88     Pulse Rate 01/31/22 1511 76     Resp 01/31/22 1511 20     Temp 01/31/22 1511 98.9 F (37.2 C)     Temp Source 01/31/22 1511 Oral     SpO2 01/31/22 1511 95 %     Weight --      Height --      Head Circumference --  Peak Flow --      Pain Score 01/31/22 1508 7     Pain Loc --      Pain Edu? --      Excl. in [place]? --    No data found.  Updated Vital Signs BP 128/88 (BP Location: Right Arm) Comment (BP Location): large cuff   Pulse 76    Temp 98.9 F (37.2 C) (Oral)    Resp 20    SpO2 95%   Visual Acuity Right Eye Distance:   Left Eye Distance:   Bilateral Distance:    Right Eye Near:   Left Eye Near:    Bilateral Near:     Physical Exam Constitutional:      Appearance: He is well-developed.  HENT:     Head: Normocephalic and atraumatic.     Right Ear: Tympanic membrane normal.     Left Ear: Tympanic membrane normal.     Nose: Congestion and rhinorrhea present.     Right Sinus: Maxillary sinus tenderness present.     Left Sinus: Maxillary sinus tenderness present.     Mouth/Throat:     Pharynx: Posterior oropharyngeal erythema  present. No pharyngeal swelling.     Tonsils: No tonsillar exudate.  Cardiovascular:     Rate and Rhythm: Normal rate and regular rhythm.  Pulmonary:     Effort: Pulmonary effort is normal.     Breath sounds: Normal breath sounds.  Musculoskeletal:     Cervical back: Normal range of motion and neck supple. Tenderness present.  Neurological:     Mental Status: He is alert.     UC Treatments / Results  Labs (all labs ordered are listed, but only abnormal results are displayed) Labs Reviewed - No data to display  EKG   Radiology No results found.  Procedures Procedures (including critical care time)  Medications Ordered in UC Medications - No data to display  Initial Impression / Assessment and Plan / UC Course  I have reviewed the triage vital signs and the nursing notes.  Pertinent labs & imaging results that were available during my care of the patient were reviewed by me and considered in my medical decision making (see chart for details).   Patient was seen today for uri symptoms, dx with sinusitis.  Zithromax to pharmacy today.  Advised otc supportive care as well.  Given his history of covid infection severity, will test for covid today.  This will be resulted tomorrow.  He will follow up if he has worsening symptoms, or not improving as expected.   Final Clinical Impressions(s) / UC Diagnoses   Final diagnoses:  Acute non-recurrent ethmoidal sinusitis  Pharyngitis, unspecified etiology  Subacute cough     Discharge Instructions      You have been diagnosed with a sinus infection today, which is likely causing your sore throat and cough.  I have sent out an antibiotic for you today.  We have also swabbed for covid and this will be resulted tomorrow.  I do recommend over the counter tylenol, and salt water gargles for sore throat.  You may take over the counter mucinex for the cough a well.     ED Prescriptions     Medication Sig Dispense Auth. Provider    azithromycin (ZITHROMAX) 250 MG tablet Take 1 tablet (250 mg total) by mouth daily. Take first 2 tablets together, then 1 every day until finished. 6 tablet Rondel Oh, MD      PDMP not reviewed this encounter.  Rondel Oh, MD 01/31/22 1558

## 2022-01-31 NOTE — Discharge Instructions (Signed)
You have been diagnosed with a sinus infection today, which is likely causing your sore throat and cough.  I have sent out an antibiotic for you today.  We have also swabbed for covid and this will be resulted tomorrow.  I do recommend over the counter tylenol, and salt water gargles for sore throat.  You may take over the counter mucinex for the cough a well.

## 2022-01-31 NOTE — Telephone Encounter (Signed)
Needs med to go to [facility] on [address]

## 2022-08-06 ENCOUNTER — Encounter: Payer: Self-pay | Admitting: Emergency Medicine

## 2022-08-06 ENCOUNTER — Ambulatory Visit
Admission: EM | Admit: 2022-08-06 | Discharge: 2022-08-06 | Disposition: A | Discharge status: Home / Self Care | Payer: Managed Care, Other (non HMO) | Attending: Family Medicine | Admitting: Family Medicine

## 2022-08-06 DIAGNOSIS — L0201 Cutaneous abscess of face: Secondary | ICD-10-CM | POA: Diagnosis not present

## 2022-08-06 MED ORDER — CEPHALEXIN 500 MG PO CAPS: 500.0000 mg | ORAL_CAPSULE | Freq: Two times a day (BID) | ORAL | 0 refills | Status: DC

## 2022-08-06 NOTE — ED Triage Notes (Signed)
Patient c/o left sided facial pain x 1 day, knot on left side of face, hot to touch, no injury to the face.  Patient denies taken any OTC pain meds.

## 2022-08-06 NOTE — ED Provider Notes (Signed)
[facility]    CSN: [id number] Arrival date & time: 08/06/22  1113      History   Chief Complaint Chief Complaint  Patient presents with   Facial Pain   HPI Mitchell Quinn is a 31 y.o. male.   Presenting today with a painful red knot to the left side of his face that he noticed yesterday.  Is gotten slightly larger and more painful, hot to the touch since yesterday.  He denies any drainage, bleeding, fevers, known dental issues, injury to the area.  Had a similar issue on the other side of his face a year or so ago and states that he did not come in soon enough and it got very large and swollen.  Has not tried anything over-the-counter for symptoms thus far.   Past Medical History:  Diagnosis Date   IBS (irritable bowel syndrome)    Pneumonia    Patient Active Problem List   Diagnosis Date Noted   Acute respiratory disease due to COVID-19 virus 09/02/2019   Pneumonia due to COVID-19 virus 09/02/2019   Respiratory Hypoxic failure, acute -Covid Related 09/02/2019   IRRITABLE BOWEL SYNDROME 10/23/2010   FOLLICULITIS 10/23/2010   ABDOMINAL PAIN 10/03/2010   DIARRHEA 09/20/2010   Sprain of ankle, unspecified site 03/26/2010   Past Surgical History:  Procedure Laterality Date   ORIF WRIST FRACTURE       Home Medications    Prior to Admission medications   Medication Sig Start Date End Date Taking? Authorizing Provider  cephALEXin (KEFLEX) 500 MG capsule Take 1 capsule (500 mg total) by mouth 2 (two) times daily. 08/06/22  Yes Particia Nearing, PA-C  albuterol (VENTOLIN HFA) 108 (90 Base) MCG/ACT inhaler Inhale 2 puffs into the lungs every 4 (four) hours as needed for wheezing or shortness of breath. 09/11/21   Lamptey, Britta Mccreedy, MD  azithromycin (ZITHROMAX) 250 MG tablet Take 1 tablet (250 mg total) by mouth daily. Take first 2 tablets together, then 1 every day until finished. 01/31/22   Piontek, Denny Peon, MD  famotidine (PEPCID) 20 MG tablet Take 1 tablet (20  mg total) by mouth 2 (two) times daily. Patient not taking: Reported on 01/31/2022 08/29/19   Petrucelli, Pleas Koch, PA-C  mupirocin ointment (BACTROBAN) 2 % Apply 1 application topically 2 (two) times daily. Patient not taking: Reported on 01/31/2022 02/11/21   Durward Parcel, FNP  ondansetron (ZOFRAN ODT) 4 MG disintegrating tablet Take 1 tablet (4 mg total) by mouth every 8 (eight) hours as needed for nausea or vomiting. 08/29/19   Petrucelli, Pleas Koch, PA-C   Family History Family History  Problem Relation Age of Onset   Healthy Mother    Healthy Father     Social History Social History   Tobacco Use   Smoking status: Some Days    Types: Cigarettes   Smokeless tobacco: Former    Types: Snuff  Vaping Use   Vaping Use: Some days  Substance Use Topics   Alcohol use: No   Drug use: Never     Allergies   Patient has no known allergies.   Review of Systems Review of Systems PER HPI  Physical Exam Triage Vital Signs ED Triage Vitals  Enc Vitals Group     BP 08/06/22 1122 126/87     Pulse Rate 08/06/22 1122 70     Resp 08/06/22 1122 18     Temp 08/06/22 1122 98.2 F (36.8 C)     Temp Source 08/06/22  1122 Oral     SpO2 08/06/22 1122 96 %     Weight 08/06/22 1124 274 lb (124.3 kg)     Height 08/06/22 1124 5\' 11"  (1.803 m)     Head Circumference --      Peak Flow --      Pain Score 08/06/22 1123 6     Pain Loc --      Pain Edu? --      Excl. in [place]? --    No data found.  Updated Vital Signs BP 126/87 (BP Location: Right Arm)   Pulse 70   Temp 98.2 F (36.8 C) (Oral)   Resp 18   Ht 5\' 11"  (1.803 m)   Wt 274 lb (124.3 kg)   SpO2 96%   BMI 38.22 kg/m   Visual Acuity Right Eye Distance:   Left Eye Distance:   Bilateral Distance:    Right Eye Near:   Left Eye Near:    Bilateral Near:     Physical Exam Vitals and nursing note reviewed.  Constitutional:      Appearance: Normal appearance.  HENT:     Head: Atraumatic.     Mouth/Throat:      Comments: No evidence of dental infection or other infectious process within the oral cavity Eyes:     Extraocular Movements: Extraocular movements intact.     Conjunctiva/sclera: Conjunctivae normal.  Cardiovascular:     Rate and Rhythm: Normal rate and regular rhythm.  Pulmonary:     Effort: Pulmonary effort is normal.     Breath sounds: Normal breath sounds.  Musculoskeletal:        General: Normal range of motion.     Cervical back: Normal range of motion and neck supple.  Skin:    General: Skin is warm.     Findings: Erythema present.     Comments: 1 cm erythematous nodule to left lower cheek, tender to palpation, warm.  No fluctuance or induration  Neurological:     General: No focal deficit present.     Mental Status: He is oriented to person, place, and time.  Psychiatric:        Mood and Affect: Mood normal.        Thought Content: Thought content normal.        Judgment: Judgment normal.      UC Treatments / Results  Labs (all labs ordered are listed, but only abnormal results are displayed) Labs Reviewed - No data to display  EKG   Radiology No results found.  Procedures Procedures (including critical care time)  Medications Ordered in UC Medications - No data to display  Initial Impression / Assessment and Plan / UC Course  I have reviewed the triage vital signs and the nursing notes.  Pertinent labs & imaging results that were available during my care of the patient were reviewed by me and considered in my medical decision making (see chart for details).     Infected cyst of face.  Treat with Keflex, warm compresses, ibuprofen as needed.  Return for worsening symptoms.  Final Clinical Impressions(s) / UC Diagnoses   Final diagnoses:  Facial abscess   Discharge Instructions   None    ED Prescriptions     Medication Sig Dispense Auth. Provider   cephALEXin (KEFLEX) 500 MG capsule Take 1 capsule (500 mg total) by mouth 2 (two) times daily. 14  capsule Volney American, [place]      PDMP not reviewed this encounter.  Particia Nearing, [place] 08/06/22 1401

## 2022-11-16 ENCOUNTER — Emergency Department (HOSPITAL_COMMUNITY)
Admission: EM | Admit: 2022-11-16 | Discharge: 2022-11-16 | Disposition: A | Discharge status: Home / Self Care | Payer: Managed Care, Other (non HMO) | Attending: Emergency Medicine | Admitting: Emergency Medicine

## 2022-11-16 ENCOUNTER — Emergency Department (HOSPITAL_COMMUNITY): Payer: Managed Care, Other (non HMO)

## 2022-11-16 ENCOUNTER — Encounter (HOSPITAL_COMMUNITY): Payer: Self-pay | Admitting: Emergency Medicine

## 2022-11-16 ENCOUNTER — Other Ambulatory Visit: Payer: Self-pay

## 2022-11-16 DIAGNOSIS — M545 Low back pain, unspecified: Secondary | ICD-10-CM | POA: Diagnosis present

## 2022-11-16 DIAGNOSIS — M5441 Lumbago with sciatica, right side: Secondary | ICD-10-CM | POA: Diagnosis not present

## 2022-11-16 HISTORY — DX: Essential (primary) hypertension: I10

## 2022-11-16 MED ORDER — KETOROLAC TROMETHAMINE 30 MG/ML IJ SOLN
15.0000 mg | Freq: Once | INTRAMUSCULAR | Status: AC
  Administered 2022-11-16: 15 mg via INTRAMUSCULAR
  Filled 2022-11-16: qty 1

## 2022-11-16 MED ORDER — PREDNISONE 10 MG (21) PO TBPK: ORAL_TABLET | Freq: Every day | ORAL | 0 refills | Status: DC

## 2022-11-16 MED ORDER — IBUPROFEN 800 MG PO TABS: 800.0000 mg | ORAL_TABLET | Freq: Three times a day (TID) | ORAL | 0 refills | Status: DC

## 2022-11-16 MED ORDER — PREDNISONE 50 MG PO TABS
60.0000 mg | ORAL_TABLET | Freq: Once | ORAL | Status: AC
  Administered 2022-11-16: 60 mg via ORAL
  Filled 2022-11-16: qty 1

## 2022-11-16 MED ORDER — LIDOCAINE 5 % EX PTCH
1.0000 | MEDICATED_PATCH | CUTANEOUS | Status: DC
  Administered 2022-11-16: 1 via TRANSDERMAL
  Filled 2022-11-16: qty 1

## 2022-11-16 NOTE — ED Triage Notes (Signed)
Pt to ER with c/o right hip and leg pain that seems to be radiating from lower back.  Pt states has noted soreness for last several days and took a nap today. Upon waking was walking down stairs and was unable to continue due to pain.  Denies known injury or previous back problems.

## 2022-11-16 NOTE — Discharge Instructions (Addendum)
You are seen today in emergency department due to low back pain which moves down your right lower leg.  The x-rays are very reassuring.  Based on your exam I think you most likely have sciatica which is nerve impingement.  This should improve with time, do the rehab exercises attached to this document.  Take the steroid Dosepak as prescribed, this is a medicine that will help reduce inflammation systemically.  Take the ibuprofen 800 mg 3 times daily or every 8 hours for the next week.  This is an anti-inflammatory medicine that helps with pain and inflammation.  They work on different system so he can take them at the same time.  If you continue having persistent pain in the next 2 weeks it is reasonable to follow-up with orthopedics. Information above to call and schedule appointment.  Return to the emergency department if you have numbness in your pelvic area, inability to urinate or defecate, loss of control of your bladder or bowels, fevers, new or concerning symptoms.

## 2022-11-16 NOTE — ED Provider Notes (Signed)
[facility] EMERGENCY DEPARTMENT Provider Note   CSN: [id number] Arrival date & time: 11/16/22  1633     History  Chief Complaint  Patient presents with   Back Pain   Leg Pain    Mitchell Quinn is a 31 y.o. male.   Back Pain Associated symptoms: leg pain   Leg Pain Associated symptoms: back pain      Patient with noncontributory medical history presents today due to lower back pain which radiates down his right lower extremity.  He has been having soreness to his back which intermittently radiates down his leg for the last 13 days.  He has been working as a Agricultural consultant, reports sleeping in uncomfortable beds and sitting for long periods of time.  States pain worsened this morning while he was ambulating.  The pain is worse when he raises lower extremity and ambulates. He denies any urinary tension, fecal incontinence, hematuria, saddle anesthesia, recent injuries, falls, rashes.  No malignancy history, previous surgeries to the lower back.  He tried IcyHot with minimal improvement.  Home Medications Prior to Admission medications   Medication Sig Start Date End Date Taking? Authorizing Provider  ibuprofen (ADVIL) 800 MG tablet Take 1 tablet (800 mg total) by mouth 3 (three) times daily. 11/16/22  Yes Theron Arista, PA-C  predniSONE (STERAPRED UNI-PAK 21 TAB) 10 MG (21) TBPK tablet Take by mouth daily. Take 6 tabs by mouth daily  for 2 days, then 5 tabs for 2 days, then 4 tabs for 2 days, then 3 tabs for 2 days, 2 tabs for 2 days, then 1 tab by mouth daily for 2 days 11/16/22  Yes Theron Arista, PA-C  albuterol (VENTOLIN HFA) 108 (90 Base) MCG/ACT inhaler Inhale 2 puffs into the lungs every 4 (four) hours as needed for wheezing or shortness of breath. 09/11/21   Lamptey, Britta Mccreedy, MD  azithromycin (ZITHROMAX) 250 MG tablet Take 1 tablet (250 mg total) by mouth daily. Take first 2 tablets together, then 1 every day until finished. 01/31/22   Piontek, Denny Peon, MD  cephALEXin  (KEFLEX) 500 MG capsule Take 1 capsule (500 mg total) by mouth 2 (two) times daily. 08/06/22   Particia Nearing, PA-C  famotidine (PEPCID) 20 MG tablet Take 1 tablet (20 mg total) by mouth 2 (two) times daily. Patient not taking: Reported on 01/31/2022 08/29/19   Petrucelli, Pleas Koch, PA-C  mupirocin ointment (BACTROBAN) 2 % Apply 1 application topically 2 (two) times daily. Patient not taking: Reported on 01/31/2022 02/11/21   Durward Parcel, FNP  ondansetron (ZOFRAN ODT) 4 MG disintegrating tablet Take 1 tablet (4 mg total) by mouth every 8 (eight) hours as needed for nausea or vomiting. 08/29/19   Petrucelli, Pleas Koch, PA-C      Allergies    Patient has no known allergies.    Review of Systems   Review of Systems  Musculoskeletal:  Positive for back pain.    Physical Exam Updated Vital Signs BP 129/74 (BP Location: Right Arm)   Pulse 70   Temp 98.6 F (37 C)   Resp 19   Ht 5\' 11"  (1.803 m)   Wt 114.8 kg   SpO2 100%   BMI 35.29 kg/m  Physical Exam Vitals and nursing note reviewed. Exam conducted with a chaperone present.  Constitutional:      Appearance: Normal appearance.  HENT:     Head: Normocephalic and atraumatic.  Eyes:     General: No scleral icterus.  Right eye: No discharge.        Left eye: No discharge.     Extraocular Movements: Extraocular movements intact.     Pupils: Pupils are equal, round, and reactive to light.  Cardiovascular:     Rate and Rhythm: Normal rate and regular rhythm.     Pulses: Normal pulses.     Heart sounds: Normal heart sounds. No murmur heard.    No friction rub. No gallop.  Pulmonary:     Effort: Pulmonary effort is normal. No respiratory distress.     Breath sounds: Normal breath sounds.  Abdominal:     General: Abdomen is flat. Bowel sounds are normal. There is no distension.     Palpations: Abdomen is soft.     Tenderness: There is no abdominal tenderness. There is no right CVA tenderness or left CVA tenderness.   Musculoskeletal:     Comments: Midline lumbar tenderness around L4-L5.  No palpable step-offs, no shingles  Skin:    General: Skin is warm and dry.     Coloration: Skin is not jaundiced.  Neurological:     Mental Status: He is alert. Mental status is at baseline.     Coordination: Coordination normal.     Comments: Positive straight leg raise right lower extremity.  Cranial nerves II through XII are grossly intact right upper and lower extremity strength is symmetric bilaterally.  Plantarflexion and dorsiflexion 5/5 against resistance.  Sensation to light touch is grossly intact to the upper and lower extremities.     ED Results / Procedures / Treatments   Labs (all labs ordered are listed, but only abnormal results are displayed) Labs Reviewed - No data to display  EKG None  Radiology DG Lumbar Spine Complete  Result Date: 11/16/2022 CLINICAL DATA:  Pain EXAM: LUMBAR SPINE - COMPLETE 4+ VIEW COMPARISON:  None Available. FINDINGS: There is no evidence of lumbar spine fracture. Alignment is normal. Intervertebral disc spaces are maintained. IMPRESSION: Negative. Electronically Signed   By: Darliss Cheney M.D.   On: 11/16/2022 17:51    Procedures Procedures    Medications Ordered in ED Medications  lidocaine (LIDODERM) 5 % 1 patch (1 patch Transdermal Patch Applied 11/16/22 1746)  ketorolac (TORADOL) 30 MG/ML injection 15 mg (15 mg Intramuscular Given 11/16/22 1745)  predniSONE (DELTASONE) tablet 60 mg (60 mg Oral Given 11/16/22 1746)    ED Course/ Medical Decision Making/ A&P Clinical Course as of 11/16/22 1853  Sat Nov 16, 2022  1759 DG Lumbar Spine Complete Negative for any acute process. [HS]    Clinical Course User Index [HS] Theron Arista, PA-C                           Medical Decision Making Amount and/or Complexity of Data Reviewed Radiology: ordered. Decision-making details documented in ED Course.  Risk Prescription drug management.   Patient presents  due to low back pain which radiates down his right lower extremity.  Differential includes but not limited to sciatica, cauda equina, malignancy, epidural abscess, DVT.  Patient is neurovascular intact with any appreciable focal deficits.  There is reproducible pain with straight leg raise.  No calf tenderness, no swelling and not consistent with a DVT.  Pulses are intact bilaterally, sensation is intact as well.  He does not have any symptoms that be stress of cauda equina, no malignancy history and is afebrile without risk factors for epidural abscess.   -BP 129/74 (BP Location: Right  Arm)   Pulse 70   Temp 98.6 F (37 C)   Resp 19   Ht 5\' 11"  (1.803 m)   Wt 114.8 kg   SpO2 100%   BMI 35.29 kg/m   I reviewed x-ray which is negative for acute process.  Agree with radiologist.  Reviewed patient's medications.  I ordered Lidoderm patch, Toradol IM, prednisone 60 mg PO.  Based on exam I think that is most likely sciatic pain which is could have been exacerbated by his recent travel and uncomfortable as sitting arrangement and positions.         Final Clinical Impression(s) / ED Diagnoses Final diagnoses:  Acute midline low back pain with right-sided sciatica    Rx / DC Orders ED Discharge Orders          Ordered    ibuprofen (ADVIL) 800 MG tablet  3 times daily        11/16/22 1808    predniSONE (STERAPRED UNI-PAK 21 TAB) 10 MG (21) TBPK tablet  Daily        11/16/22 1808              11/18/22, PA-C 11/16/22 1853    11/18/22, MD 11/17/22 2332

## 2023-01-04 ENCOUNTER — Other Ambulatory Visit: Payer: Self-pay

## 2023-01-04 ENCOUNTER — Emergency Department (HOSPITAL_COMMUNITY): Payer: PRIVATE HEALTH INSURANCE

## 2023-01-04 ENCOUNTER — Emergency Department (HOSPITAL_COMMUNITY)
Admission: EM | Admit: 2023-01-04 | Discharge: 2023-01-04 | Disposition: A | Discharge status: Home / Self Care | Payer: PRIVATE HEALTH INSURANCE | Attending: Emergency Medicine | Admitting: Emergency Medicine

## 2023-01-04 ENCOUNTER — Encounter (HOSPITAL_COMMUNITY): Payer: Self-pay

## 2023-01-04 DIAGNOSIS — R079 Chest pain, unspecified: Secondary | ICD-10-CM | POA: Insufficient documentation

## 2023-01-04 DIAGNOSIS — R112 Nausea with vomiting, unspecified: Secondary | ICD-10-CM

## 2023-01-04 DIAGNOSIS — I10 Essential (primary) hypertension: Secondary | ICD-10-CM | POA: Insufficient documentation

## 2023-01-04 DIAGNOSIS — R5383 Other fatigue: Secondary | ICD-10-CM

## 2023-01-04 DIAGNOSIS — Z20822 Contact with and (suspected) exposure to covid-19: Secondary | ICD-10-CM | POA: Insufficient documentation

## 2023-01-04 DIAGNOSIS — R072 Precordial pain: Secondary | ICD-10-CM

## 2023-01-04 DIAGNOSIS — R4182 Altered mental status, unspecified: Secondary | ICD-10-CM | POA: Insufficient documentation

## 2023-01-04 DIAGNOSIS — R1110 Vomiting, unspecified: Secondary | ICD-10-CM | POA: Insufficient documentation

## 2023-01-04 LAB — RAPID URINE DRUG SCREEN, HOSP PERFORMED
Amphetamines: NOT DETECTED
Barbiturates: NOT DETECTED
Benzodiazepines: NOT DETECTED
Cocaine: NOT DETECTED
Opiates: POSITIVE — AB
Tetrahydrocannabinol: NOT DETECTED

## 2023-01-04 LAB — CBC WITH DIFFERENTIAL/PLATELET
Abs Immature Granulocytes: 0.07 10*3/uL (ref 0.00–0.07)
Basophils Absolute: 0.1 10*3/uL (ref 0.0–0.1)
Basophils Relative: 1 %
Eosinophils Absolute: 0.1 10*3/uL (ref 0.0–0.5)
Eosinophils Relative: 1 %
HCT: 37.2 % — ABNORMAL LOW (ref 39.0–52.0)
Hemoglobin: 13.3 g/dL (ref 13.0–17.0)
Immature Granulocytes: 1 %
Lymphocytes Relative: 21 %
Lymphs Abs: 2.1 10*3/uL (ref 0.7–4.0)
MCH: 31.8 pg (ref 26.0–34.0)
MCHC: 35.8 g/dL (ref 30.0–36.0)
MCV: 89 fL (ref 80.0–100.0)
Monocytes Absolute: 0.7 10*3/uL (ref 0.1–1.0)
Monocytes Relative: 7 %
Neutro Abs: 6.7 10*3/uL (ref 1.7–7.7)
Neutrophils Relative %: 69 %
Platelets: 310 10*3/uL (ref 150–400)
RBC: 4.18 MIL/uL — ABNORMAL LOW (ref 4.22–5.81)
RDW: 12.5 % (ref 11.5–15.5)
WBC: 9.7 10*3/uL (ref 4.0–10.5)
nRBC: 0 % (ref 0.0–0.2)

## 2023-01-04 LAB — URINALYSIS, ROUTINE W REFLEX MICROSCOPIC
Bilirubin Urine: NEGATIVE
Glucose, UA: NEGATIVE mg/dL
Hgb urine dipstick: NEGATIVE
Ketones, ur: NEGATIVE mg/dL
Leukocytes,Ua: NEGATIVE
Nitrite: NEGATIVE
Protein, ur: NEGATIVE mg/dL
Specific Gravity, Urine: 1.016 (ref 1.005–1.030)
pH: 7 (ref 5.0–8.0)

## 2023-01-04 LAB — RESP PANEL BY RT-PCR (RSV, FLU A&B, COVID)  RVPGX2
Influenza A by PCR: NEGATIVE
Influenza B by PCR: NEGATIVE
Resp Syncytial Virus by PCR: NEGATIVE
SARS Coronavirus 2 by RT PCR: NEGATIVE

## 2023-01-04 LAB — ETHANOL: Alcohol, Ethyl (B): <10 mg/dL (ref <10)

## 2023-01-04 LAB — COMPREHENSIVE METABOLIC PANEL
ALT: 23 U/L (ref 0–44)
AST: 26 U/L (ref 15–41)
Albumin: 4.1 g/dL (ref 3.5–5.0)
Alkaline Phosphatase: 59 U/L (ref 38–126)
Anion gap: 10 (ref 5–15)
BUN: 8 mg/dL (ref 6–20)
CO2: 25 mmol/L (ref 22–32)
Calcium: 8.9 mg/dL (ref 8.9–10.3)
Chloride: 102 mmol/L (ref 98–111)
Creatinine, Ser: 1.08 mg/dL (ref 0.61–1.24)
GFR, Estimated: >60 mL/min (ref >60)
Glucose, Bld: 83 mg/dL (ref 70–99)
Potassium: 3.3 mmol/L — ABNORMAL LOW (ref 3.5–5.1)
Sodium: 137 mmol/L (ref 135–145)
Total Bilirubin: 0.7 mg/dL (ref 0.3–1.2)
Total Protein: 6.4 g/dL — ABNORMAL LOW (ref 6.5–8.1)

## 2023-01-04 LAB — TROPONIN I (HIGH SENSITIVITY)
Troponin I (High Sensitivity): 5 ng/L (ref <18)
Troponin I (High Sensitivity): 4 ng/L (ref <18)

## 2023-01-04 LAB — LIPASE, BLOOD: Lipase: 45 U/L (ref 11–51)

## 2023-01-04 LAB — CBG MONITORING, ED: Glucose-Capillary: 105 mg/dL — ABNORMAL HIGH (ref 70–99)

## 2023-01-04 LAB — LACTIC ACID, PLASMA: Lactic Acid, Venous: 1.6 mmol/L (ref 0.5–1.9)

## 2023-01-04 LAB — AMMONIA: Ammonia: <10 umol/L (ref 9–35)

## 2023-01-04 SURGERY — CORONARY/GRAFT ACUTE MI REVASCULARIZATION
Anesthesia: LOCAL

## 2023-01-04 MED ORDER — SODIUM CHLORIDE 0.9 % IV BOLUS (SEPSIS)
1000.0000 mL | Freq: Once | INTRAVENOUS | Status: AC
  Administered 2023-01-04: 1000 mL via INTRAVENOUS

## 2023-01-04 MED ORDER — IOHEXOL 350 MG/ML SOLN
100.0000 mL | Freq: Once | INTRAVENOUS | Status: AC | PRN
  Administered 2023-01-04: 100 mL via INTRAVENOUS

## 2023-01-04 NOTE — ED Provider Notes (Signed)
[facility] EMERGENCY DEPARTMENT Provider Note   CSN: [id number] Arrival date & time: 01/04/23  0132     History  Chief Complaint  Patient presents with   Chest Pain   Level 5 caveat due to altered mental status  Mitchell Quinn is a 32 y.o. male.   Chest Pain Patient with history of hypertension presents with EMS for chest pain.  It is reported that patient was at a movie when he started reporting chest pain and having episodes of vomiting.  EMS was called and he was found lying in the bathroom.  No falls or traumas reported.  EMS gave patient aspirin, nitroglycerin and morphine.  They report he has been "lethargic" the entire trip. EMS was concern for ST elevation MI and a code STEMI was activated    Past Medical History:  Diagnosis Date   Hypertension    IBS (irritable bowel syndrome)    Pneumonia     Home Medications Prior to Admission medications   Medication Sig Start Date End Date Taking? Authorizing Provider  albuterol (VENTOLIN HFA) 108 (90 Base) MCG/ACT inhaler Inhale 2 puffs into the lungs every 4 (four) hours as needed for wheezing or shortness of breath. 09/11/21   Lamptey, Britta Mccreedy, MD      Allergies    Patient has no known allergies.    Review of Systems   Review of Systems  Unable to perform ROS: Mental status change  Cardiovascular:  Positive for chest pain.    Physical Exam Updated Vital Signs BP 110/79   Pulse 72   Temp 98.1 F (36.7 C) (Oral)   Resp 18   Ht 1.803 m (5\' 11" )   Wt 111.6 kg   SpO2 97%   BMI 34.31 kg/m  Physical Exam CONSTITUTIONAL: Somnolent HEAD: Normocephalic/atraumatic, no visible trauma EYES: EOMI/PERRL, no nystagmus ENMT: Mucous membranes moist NECK: supple no meningeal signs SPINE/BACK:entire spine nontender No bruising/crepitance/stepoffs noted to spine CV: S1/S2 noted, no murmurs/rubs/gallops noted LUNGS: Lungs are clear to auscultation bilaterally, no apparent distress ABDOMEN: soft,  nontender NEURO: Pt is somnolent.  He will arouse to voice and follow commands, he moves all extremities x 4.  No focal weakness EXTREMITIES: pulses normal/equalx4, full ROM, no visible trauma SKIN: warm, color normal PSYCH: Unable to assess  ED Results / Procedures / Treatments   Labs (all labs ordered are listed, but only abnormal results are displayed) Labs Reviewed  COMPREHENSIVE METABOLIC PANEL - Abnormal; Notable for the following components:      Result Value   Potassium 3.3 (*)    Total Protein 6.4 (*)    All other components within normal limits  CBC WITH DIFFERENTIAL/PLATELET - Abnormal; Notable for the following components:   RBC 4.18 (*)    HCT 37.2 (*)    All other components within normal limits  RAPID URINE DRUG SCREEN, HOSP PERFORMED - Abnormal; Notable for the following components:   Opiates POSITIVE (*)    All other components within normal limits  CBG MONITORING, ED - Abnormal; Notable for the following components:   Glucose-Capillary 105 (*)    All other components within normal limits  RESP PANEL BY RT-PCR (RSV, FLU A&B, COVID)  [id number]  URINALYSIS, ROUTINE W REFLEX MICROSCOPIC  AMMONIA  LACTIC ACID, PLASMA  ETHANOL  LIPASE, BLOOD  TROPONIN I (HIGH SENSITIVITY)  TROPONIN I (HIGH SENSITIVITY)    EKG EKG Interpretation  Date/Time:  Saturday January 04 2023 06:11:34 EST Ventricular Rate:  59 PR Interval:  216 QRS Duration: 106 QT Interval:  416 QTC Calculation: 413 R Axis:   53 Text Interpretation: Sinus rhythm Prolonged PR interval Interpretation limited secondary to artifact No significant change since last tracing Confirmed by Ripley Fraise [phone]) on 01/04/2023 6:18:03 AM  Radiology CT Angio Chest/Abd/Pel for Dissection W and/or Wo Contrast  Result Date: 01/04/2023 CLINICAL DATA:  Acute aortic syndrome suspected.  Chest pain. EXAM: CT ANGIOGRAPHY CHEST, ABDOMEN AND PELVIS TECHNIQUE: Non-contrast CT of the chest was initially obtained.  Multidetector CT imaging through the chest, abdomen and pelvis was performed using the standard protocol during bolus administration of intravenous contrast. Multiplanar reconstructed images and MIPs were obtained and reviewed to evaluate the vascular anatomy. RADIATION DOSE REDUCTION: This exam was performed according to the departmental dose-optimization program which includes automated exposure control, adjustment of the mA and/or kV according to patient size and/or use of iterative reconstruction technique. CONTRAST:  128mL OMNIPAQUE IOHEXOL 350 MG/ML SOLN COMPARISON:  None Available. FINDINGS: CTA CHEST FINDINGS Cardiovascular: The heart is normal in size and there is no pericardial effusion. The aorta and pulmonary trunk are normal in caliber. No dissection is seen. Mediastinum/Nodes: No enlarged mediastinal, hilar, or axillary lymph nodes. Thyroid gland, trachea, and esophagus demonstrate no significant findings. Lungs/Pleura: Dependent atelectasis is present bilaterally. No effusion or pneumothorax. Musculoskeletal: No chest wall abnormality. No acute or significant osseous findings. Review of the MIP images confirms the above findings. CTA ABDOMEN AND PELVIS FINDINGS VASCULAR Aorta: Normal caliber aorta without aneurysm, dissection, vasculitis or significant stenosis. Celiac: Patent without evidence of aneurysm, dissection, vasculitis or significant stenosis. SMA: Patent without evidence of aneurysm, dissection, vasculitis or significant stenosis. Renals: Both renal arteries are patent without evidence of aneurysm, dissection, vasculitis, fibromuscular dysplasia or significant stenosis. IMA: Patent without evidence of aneurysm, dissection, vasculitis or significant stenosis. Inflow: Patent without evidence of aneurysm, dissection, vasculitis or significant stenosis. Veins: No obvious venous abnormality within the limitations of this arterial phase study. Review of the MIP images confirms the above findings.  NON-VASCULAR Hepatobiliary: No focal liver abnormality is seen. No gallstones, gallbladder wall thickening, or biliary dilatation. Pancreas: Unremarkable. No pancreatic ductal dilatation or surrounding inflammatory changes. Spleen: Normal in size without focal abnormality. Adrenals/Urinary Tract: Adrenal glands are unremarkable. Kidneys are normal, without renal calculi, focal lesion, or hydronephrosis. Bladder is unremarkable. Stomach/Bowel: Stomach is within normal limits. Appendix appears normal. No evidence of bowel wall thickening, distention, or inflammatory changes. No free air or pneumatosis. Lymphatic: No abdominal or pelvic lymphadenopathy. Reproductive: Prostate is unremarkable. Other: No ascites. Musculoskeletal: No acute osseous abnormality. Review of the MIP images confirms the above findings. IMPRESSION: 1. No evidence of aortic aneurysm or dissection. 2. No acute process in the chest, abdomen, or pelvis. Electronically Signed   By: Brett Fairy M.D.   On: 01/04/2023 04:11   CT HEAD WO CONTRAST  Result Date: 01/04/2023 CLINICAL DATA:  Chest pain and altered mental status. EXAM: CT HEAD WITHOUT CONTRAST TECHNIQUE: Contiguous axial images were obtained from the base of the skull through the vertex without intravenous contrast. RADIATION DOSE REDUCTION: This exam was performed according to the departmental dose-optimization program which includes automated exposure control, adjustment of the mA and/or kV according to patient size and/or use of iterative reconstruction technique. COMPARISON:  November 05, 2006 FINDINGS: Brain: No evidence of acute infarction, hemorrhage, hydrocephalus, extra-axial collection or mass lesion/mass effect. Vascular: No hyperdense vessel or unexpected calcification. Skull: Normal. Negative for fracture or focal lesion. Sinuses/Orbits: No acute finding. Other: None. IMPRESSION: No  acute intracranial pathology. Electronically Signed   By: Virgina Norfolk M.D.   On:  01/04/2023 02:08   DG Chest Port 1 View  Result Date: 01/04/2023 CLINICAL DATA:  Altered mental status and chest pain. EXAM: PORTABLE CHEST 1 VIEW COMPARISON:  None Available. FINDINGS: The heart size and mediastinal contours are within normal limits. Low lung volumes are noted. Mild to moderate severity infiltrate is seen within the retrocardiac region of the left lung base. There is no evidence of a pleural effusion or pneumothorax. The visualized skeletal structures are unremarkable. IMPRESSION: Mild to moderate severity left basilar infiltrate. Electronically Signed   By: Virgina Norfolk M.D.   On: 01/04/2023 02:02    Procedures .Critical Care  Performed by: Ripley Fraise, MD Authorized by: Ripley Fraise, MD   Critical care provider statement:    Critical care time (minutes):  45   Critical care start time:  01/04/2023 2:30 AM   Critical care end time:  01/04/2023 3:15 AM   Critical care time was exclusive of:  Separately billable procedures and treating other patients   Critical care was necessary to treat or prevent imminent or life-threatening deterioration of the following conditions:  Cardiac failure and circulatory failure   Critical care was time spent personally by me on the following activities:  Examination of patient, obtaining history from patient or surrogate, development of treatment plan with patient or surrogate, evaluation of patient's response to treatment, re-evaluation of patient's condition, pulse oximetry, ordering and review of radiographic studies, ordering and review of laboratory studies and ordering and performing treatments and interventions   I assumed direction of critical care for this patient from another provider in my specialty: no       Medications Ordered in ED Medications  sodium chloride 0.9 % bolus 1,000 mL (1,000 mLs Intravenous New Bag/Given 01/04/23 0228)  iohexol (OMNIPAQUE) 350 MG/ML injection 100 mL (100 mLs Intravenous Contrast Given 01/04/23  0351)    ED Course/ Medical Decision Making/ A&P Clinical Course as of 01/04/23 [id number]  Sat Jan 04, 2023  0140 Code STEMI canceled by Dr. Ali Lowe after arrival [DW]  0206 Patient initially presented as a code STEMI from [facility].  This was promptly canceled on arrival by cardiology.  However patient is somnolent and very difficult to get any information from.  Extensive workup has been initiated. [DW]  0227 Patient still somnolent.  He is now reporting the chest pain radiates into his back.  Will obtain CT dissection study for further evaluation of acute aortic syndrome.  Family at bedside report patient has been under increased stress recently with his job [DW]  [phone] Patient feeling improved.  He is awake and alert.  Vitals have overall been appropriate.  Initial troponin negative.  CT dissection study is negative.  Wife reports he did not start feeling sick until after the movie, he reported chest pain and had vomiting.  He then laid down on the floor of the bathroom.  He had been well prior to this.  Will continue to monitor [DW]  0515 Drug screen positive for opiates, but he was given morphine by EMS [DW]  [phone] Patient feels improved, ambulatory.  He would like to be discharged home.  I suspect patient may have had a near syncopal event due to vasovagal episode from vomiting.  He has been hemodynamically appropriate here in the ER.  He will follow-up as an outpatient. [DW]    Clinical Course User Index [DW] Ripley Fraise, MD  Medical Decision Making Amount and/or Complexity of Data Reviewed Labs: ordered. Radiology: ordered. ECG/medicine tests: ordered.  Risk Prescription drug management.   This patient presents to the ED for concern of chest pain, this involves an extensive number of treatment options, and is a complaint that carries with it a high risk of complications and morbidity.  The differential diagnosis includes but is not limited to acute  coronary syndrome, aortic dissection, pulmonary embolism, pericarditis, pneumothorax, pneumonia, myocarditis, pleurisy, esophageal rupture    Comorbidities that complicate the patient evaluation: Patient's presentation is complicated by their history of hypertension  Social Determinants of Health: Patient's lack of insurance  increases the complexity of managing their presentation  Additional history obtained: Additional history obtained from EMS discussed with paramedic at bedside Records reviewed  urgent care notes reviewed  Lab Tests: I Ordered, and personally interpreted labs.  The pertinent results include: Labs overall unremarkable  Imaging Studies ordered: I ordered imaging studies including CT scan head and X-ray chest   I independently visualized and interpreted imaging which showed no acute findings I agree with the radiologist interpretation  Cardiac Monitoring: The patient was maintained on a cardiac monitor.  I personally viewed and interpreted the cardiac monitor which showed an underlying rhythm of:  sinus rhythm  Medicines ordered and prescription drug management: I ordered medication including IV fluids for dehydration Reevaluation of the patient after these medicines showed that the patient    improved   Critical Interventions:   IV fluids and monitoring  Consultations Obtained: Seen by cardiology who do not feel this was a STEMI  Reevaluation: After the interventions noted above, I reevaluated the patient and found that they have :improved  Complexity of problems addressed: Patient's presentation is most consistent with  acute presentation with potential threat to life or bodily function  Disposition: After consideration of the diagnostic results and the patient's response to treatment,  I feel that the patent would benefit from discharge   .           Final Clinical Impression(s) / ED Diagnoses Final diagnoses:  None    Rx / DC Orders ED  Discharge Orders     None         Ripley Fraise, MD 01/04/23 [phone]

## 2023-01-04 NOTE — Progress Notes (Signed)
Interventional Cardiology Note:  Called to ER by EMS for STEMI activation.  On presentation here, the patient is lethargic.  EKG does not demonstrate STEMI.  Discussed with ED staff; will defer emergency coronary angiography at this time.

## 2023-01-04 NOTE — ED Triage Notes (Signed)
Pt BIB EMS with c/o CP that started while he was at the movies. Pt endorses n/v. Per EMS, pt seemed more sleepy enroute.     Nitro x1 4mg  of morphine

## 2023-01-04 NOTE — ED Notes (Signed)
Patient transported to CT 

## 2023-01-04 NOTE — Discharge Instructions (Signed)

## 2023-09-01 ENCOUNTER — Emergency Department (HOSPITAL_COMMUNITY): Payer: 59

## 2023-09-01 ENCOUNTER — Other Ambulatory Visit: Payer: Self-pay

## 2023-09-01 ENCOUNTER — Emergency Department (HOSPITAL_COMMUNITY)
Admission: EM | Admit: 2023-09-01 | Discharge: 2023-09-01 | Disposition: A | Discharge status: Home / Self Care | Payer: 59 | Attending: Emergency Medicine | Admitting: Emergency Medicine

## 2023-09-01 ENCOUNTER — Encounter (HOSPITAL_COMMUNITY): Payer: Self-pay

## 2023-09-01 DIAGNOSIS — Z79899 Other long term (current) drug therapy: Secondary | ICD-10-CM | POA: Insufficient documentation

## 2023-09-01 DIAGNOSIS — R0600 Dyspnea, unspecified: Secondary | ICD-10-CM | POA: Diagnosis not present

## 2023-09-01 DIAGNOSIS — R079 Chest pain, unspecified: Secondary | ICD-10-CM | POA: Insufficient documentation

## 2023-09-01 DIAGNOSIS — I10 Essential (primary) hypertension: Secondary | ICD-10-CM | POA: Insufficient documentation

## 2023-09-01 DIAGNOSIS — R112 Nausea with vomiting, unspecified: Secondary | ICD-10-CM | POA: Diagnosis not present

## 2023-09-01 DIAGNOSIS — R61 Generalized hyperhidrosis: Secondary | ICD-10-CM | POA: Diagnosis not present

## 2023-09-01 LAB — CBC WITH DIFFERENTIAL/PLATELET
Abs Immature Granulocytes: 0.07 10*3/uL (ref 0.00–0.07)
Basophils Absolute: 0.1 10*3/uL (ref 0.0–0.1)
Basophils Relative: 1 %
Eosinophils Absolute: 0.4 10*3/uL (ref 0.0–0.5)
Eosinophils Relative: 5 %
HCT: 39.4 % (ref 39.0–52.0)
Hemoglobin: 13.9 g/dL (ref 13.0–17.0)
Immature Granulocytes: 1 %
Lymphocytes Relative: 36 %
Lymphs Abs: 2.7 10*3/uL (ref 0.7–4.0)
MCH: 31.9 pg (ref 26.0–34.0)
MCHC: 35.3 g/dL (ref 30.0–36.0)
MCV: 90.4 fL (ref 80.0–100.0)
Monocytes Absolute: 0.7 10*3/uL (ref 0.1–1.0)
Monocytes Relative: 9 %
Neutro Abs: 3.7 10*3/uL (ref 1.7–7.7)
Neutrophils Relative %: 48 %
Platelets: 289 10*3/uL (ref 150–400)
RBC: 4.36 MIL/uL (ref 4.22–5.81)
RDW: 11.8 % (ref 11.5–15.5)
WBC: 7.6 10*3/uL (ref 4.0–10.5)
nRBC: 0 % (ref 0.0–0.2)

## 2023-09-01 LAB — BASIC METABOLIC PANEL
Anion gap: 6 (ref 5–15)
BUN: 12 mg/dL (ref 6–20)
CO2: 27 mmol/L (ref 22–32)
Calcium: 9 mg/dL (ref 8.9–10.3)
Chloride: 104 mmol/L (ref 98–111)
Creatinine, Ser: 1.03 mg/dL (ref 0.61–1.24)
GFR, Estimated: >60 mL/min (ref >60)
Glucose, Bld: 108 mg/dL — ABNORMAL HIGH (ref 70–99)
Potassium: 3.6 mmol/L (ref 3.5–5.1)
Sodium: 137 mmol/L (ref 135–145)

## 2023-09-01 LAB — D-DIMER, QUANTITATIVE: D-Dimer, Quant: <0.27 ug{FEU}/mL (ref 0.00–0.50)

## 2023-09-01 LAB — TROPONIN I (HIGH SENSITIVITY)
Troponin I (High Sensitivity): 7 ng/L (ref <18)
Troponin I (High Sensitivity): 7 ng/L (ref <18)

## 2023-09-01 MED ORDER — PANTOPRAZOLE SODIUM 40 MG PO TBEC: 40.0000 mg | DELAYED_RELEASE_TABLET | Freq: Every day | ORAL | 0 refills | Status: DC

## 2023-09-01 MED ORDER — ASPIRIN 81 MG PO CHEW
324.0000 mg | CHEWABLE_TABLET | Freq: Once | ORAL | Status: AC
  Administered 2023-09-01: 324 mg via ORAL
  Filled 2023-09-01: qty 4

## 2023-09-01 MED ORDER — ONDANSETRON HCL 4 MG/2ML IJ SOLN
4.0000 mg | Freq: Once | INTRAMUSCULAR | Status: AC
  Administered 2023-09-01: 4 mg via INTRAVENOUS
  Filled 2023-09-01: qty 2

## 2023-09-01 MED ORDER — PANTOPRAZOLE SODIUM 40 MG PO TBEC: 40.0000 mg | DELAYED_RELEASE_TABLET | Freq: Once | ORAL | Status: DC

## 2023-09-01 NOTE — ED Provider Notes (Signed)
[place] [facility]ERGENCY DEPARTMENT AT [facility] Provider Note   CSN: [id number] Arrival date & time: 09/01/23  0430     History  Chief Complaint  Patient presents with   Chest Pain    Mitchell Quinn is a 32 y.o. male.  The history is provided by the patient.  Chest Pain He has history of hypertension and comes in because of chest pain which woke him up.  He states it has been waking him up for the last several nights, and also had a similar episode several weeks ago.  Pain is in the midsternal area with radiation of the back.  He describes a burning pain with associated nausea, vomiting, diaphoresis, dyspnea.  Nothing seems to make his pain better and nothing makes it worse.  He does not smoke but he vapes.  There is a strong family history of premature coronary atherosclerosis with both parents having had heart attacks at age 40.  Also, he works as a Agricultural consultant.   Home Medications Prior to Admission medications   Medication Sig Start Date End Date Taking? Authorizing Provider  losartan (COZAAR) 25 MG tablet Take 25 mg by mouth daily. 08/16/23  Yes [provider]  pantoprazole (PROTONIX) 40 MG tablet Take 1 tablet (40 mg total) by mouth daily. 09/01/23  Yes Dione Booze, MD  albuterol (VENTOLIN HFA) 108 (90 Base) MCG/ACT inhaler Inhale 2 puffs into the lungs every 4 (four) hours as needed for wheezing or shortness of breath. 09/11/21   Lamptey, Britta Mccreedy, MD      Allergies    Patient has no known allergies.    Review of Systems   Review of Systems  Cardiovascular:  Positive for chest pain.  All other systems reviewed and are negative.   Physical Exam Updated Vital Signs BP 126/80 (BP Location: Left Arm)   Pulse (!) 49   Resp 16   Ht 5\' 11"  (1.803 m)   Wt 112.5 kg   SpO2 97%   BMI 34.59 kg/m  Physical Exam Vitals and nursing note reviewed.   32 year old male, resting comfortably and in no acute distress. Vital signs are significant for  slow heart rate. Oxygen saturation is 97%, which is normal. Head is normocephalic and atraumatic. PERRLA, EOMI. Oropharynx is clear. Neck is nontender and supple without adenopathy or JVD. Back is nontender and there is no CVA tenderness. Lungs are clear without rales, wheezes, or rhonchi. Chest is nontender. Heart has regular rate and rhythm without murmur. Abdomen is soft, flat, nontender. Extremities have no cyanosis or edema, full range of motion is present. Skin is warm and dry without rash. Neurologic: Mental status is normal, cranial nerves are intact, moves all extremities equally.  ED Results / Procedures / Treatments   Labs (all labs ordered are listed, but only abnormal results are displayed) Labs Reviewed  BASIC METABOLIC PANEL - Abnormal; Notable for the following components:      Result Value   Glucose, Bld 108 (*)    All other components within normal limits  CBC WITH DIFFERENTIAL/PLATELET  D-DIMER, QUANTITATIVE (NOT AT [facility])  TROPONIN I (HIGH SENSITIVITY)  TROPONIN I (HIGH SENSITIVITY)    EKG EKG Interpretation Date/Time:  Monday September 01 2023 04:50:01 EDT Ventricular Rate:  49 PR Interval:  220 QRS Duration:  109 QT Interval:  436 QTC Calculation: 394 R Axis:   38  Text Interpretation: Sinus bradycardia Prolonged PR interval ST elev, probable normal early repol pattern Baseline wander  in lead(s) II III aVF V6 When compared with ECG of 01/04/2023, No significant change was found Confirmed by Dione Booze ([id number]) on 09/01/2023 4:58:00 AM  Radiology DG Chest 2 View  Result Date: 09/01/2023 CLINICAL DATA:  32 year old male with history of chest pain. EXAM: CHEST - 2 VIEW COMPARISON:  Chest x-ray 01/04/2023. FINDINGS: Lung volumes are normal. No consolidative airspace disease. No pleural effusions. No pneumothorax. No pulmonary nodule or mass noted. Pulmonary vasculature and the cardiomediastinal silhouette are within normal limits. IMPRESSION: No radiographic  evidence of acute cardiopulmonary disease. Electronically Signed   By: Trudie Reed M.D.   On: 09/01/2023 05:44    Procedures Procedures  Cardiac monitor shows sinus bradycardia, per my interpretation.  Medications Ordered in ED Medications  pantoprazole (PROTONIX) EC tablet 40 mg (has no administration in time range)  ondansetron (ZOFRAN) injection 4 mg (4 mg Intravenous Given 09/01/23 0509)  aspirin chewable tablet 324 mg (324 mg Oral Given 09/01/23 0508)    ED Course/ Medical Decision Making/ A&P             HEART Score: 1                    Medical Decision Making Amount and/or Complexity of Data Reviewed Labs: ordered. Radiology: ordered.  Risk OTC drugs. Prescription drug management.   Chest pain of uncertain cause.  Consider ACS, GERD, pericarditis, pulmonary embolism.  I have reviewed and interpreted his electrocardiogram, and my interpretation is early repolarization but no acute ST or T changes.  I have ordered laboratory workup of CBC, basic metabolic panel, troponin x 2, D-dimer.  I have ordered oral aspirin and intravenous ondansetron.  I have reviewed his past records, and note ED visit on 01/04/2023 for chest pain which actually started as a code STEMI.  Chest x-ray shows no acute process.  Have independently viewed the images, and agree with the radiologist's interpretation.  I have reviewed his laboratory tests and my interpretation is normal troponin x 2, normal D-dimer, mildly elevated random glucose level which will need to be followed as an outpatient, normal CBC.  At this point, I do feel his symptoms are from GERD and I have ordered a dose of pantoprazole and a prescription for pantoprazole.  However, given his strong family history, I am also giving him an ambulatory referral to cardiology for further evaluation.  Heart score is 1, which puts him at low risk for major adverse cardiac events in the next 6 weeks.  Final Clinical Impression(s) / ED Diagnoses Final  diagnoses:  Nonspecific chest pain    Rx / DC Orders ED Discharge Orders          Ordered    pantoprazole (PROTONIX) 40 MG tablet  Daily        09/01/23 0801    Ambulatory referral to Cardiology       Comments: If you have not heard from the Cardiology office within the next 72 hours please call [phone].   09/01/23 9147              Dione Booze, MD 09/01/23 [phone]

## 2023-09-01 NOTE — ED Triage Notes (Signed)
Pt arrived from home via POV c/o sternal CP that radiates to his back. Pt endorse abdominal pain, N/V. Pt reports taking Pepto Bismol w/o relief.

## 2023-09-01 NOTE — ED Notes (Signed)
Patient transported to X-ray 

## 2023-11-15 ENCOUNTER — Other Ambulatory Visit: Payer: Self-pay

## 2023-11-15 ENCOUNTER — Encounter (HOSPITAL_COMMUNITY): Payer: Self-pay

## 2023-11-15 ENCOUNTER — Emergency Department (HOSPITAL_COMMUNITY)
Admission: EM | Admit: 2023-11-15 | Discharge: 2023-11-16 | Disposition: A | Discharge status: Home / Self Care | Payer: 59 | Attending: Student | Admitting: Student

## 2023-11-15 DIAGNOSIS — Z79899 Other long term (current) drug therapy: Secondary | ICD-10-CM | POA: Diagnosis not present

## 2023-11-15 DIAGNOSIS — R079 Chest pain, unspecified: Secondary | ICD-10-CM | POA: Diagnosis not present

## 2023-11-15 DIAGNOSIS — I10 Essential (primary) hypertension: Secondary | ICD-10-CM | POA: Diagnosis not present

## 2023-11-15 DIAGNOSIS — R1013 Epigastric pain: Secondary | ICD-10-CM

## 2023-11-15 DIAGNOSIS — Z87891 Personal history of nicotine dependence: Secondary | ICD-10-CM | POA: Insufficient documentation

## 2023-11-15 DIAGNOSIS — R1110 Vomiting, unspecified: Secondary | ICD-10-CM | POA: Diagnosis present

## 2023-11-15 DIAGNOSIS — K2900 Acute gastritis without bleeding: Secondary | ICD-10-CM | POA: Diagnosis not present

## 2023-11-15 LAB — CBC WITH DIFFERENTIAL/PLATELET
Abs Immature Granulocytes: 0.09 10*3/uL — ABNORMAL HIGH (ref 0.00–0.07)
Basophils Absolute: 0.1 10*3/uL (ref 0.0–0.1)
Basophils Relative: 1 %
Eosinophils Absolute: 0.3 10*3/uL (ref 0.0–0.5)
Eosinophils Relative: 2 %
HCT: 39 % (ref 39.0–52.0)
Hemoglobin: 13.5 g/dL (ref 13.0–17.0)
Immature Granulocytes: 1 %
Lymphocytes Relative: 16 %
Lymphs Abs: 2.1 10*3/uL (ref 0.7–4.0)
MCH: 31 pg (ref 26.0–34.0)
MCHC: 34.6 g/dL (ref 30.0–36.0)
MCV: 89.4 fL (ref 80.0–100.0)
Monocytes Absolute: 0.9 10*3/uL (ref 0.1–1.0)
Monocytes Relative: 6 %
Neutro Abs: 9.8 10*3/uL — ABNORMAL HIGH (ref 1.7–7.7)
Neutrophils Relative %: 74 %
Platelets: 292 10*3/uL (ref 150–400)
RBC: 4.36 MIL/uL (ref 4.22–5.81)
RDW: 12.1 % (ref 11.5–15.5)
WBC: 13.2 10*3/uL — ABNORMAL HIGH (ref 4.0–10.5)
nRBC: 0 % (ref 0.0–0.2)

## 2023-11-15 LAB — COMPREHENSIVE METABOLIC PANEL
ALT: 18 U/L (ref 0–44)
AST: 18 U/L (ref 15–41)
Albumin: 4.3 g/dL (ref 3.5–5.0)
Alkaline Phosphatase: 57 U/L (ref 38–126)
Anion gap: 8 (ref 5–15)
BUN: 10 mg/dL (ref 6–20)
CO2: 27 mmol/L (ref 22–32)
Calcium: 9.2 mg/dL (ref 8.9–10.3)
Chloride: 102 mmol/L (ref 98–111)
Creatinine, Ser: 0.97 mg/dL (ref 0.61–1.24)
GFR, Estimated: >60 mL/min (ref >60)
Glucose, Bld: 109 mg/dL — ABNORMAL HIGH (ref 70–99)
Potassium: 3.8 mmol/L (ref 3.5–5.1)
Sodium: 137 mmol/L (ref 135–145)
Total Bilirubin: 0.6 mg/dL (ref <1.2)
Total Protein: 7.3 g/dL (ref 6.5–8.1)

## 2023-11-15 LAB — LIPASE, BLOOD: Lipase: 43 U/L (ref 11–51)

## 2023-11-15 MED ORDER — ONDANSETRON HCL 4 MG/2ML IJ SOLN
4.0000 mg | Freq: Once | INTRAMUSCULAR | Status: AC
  Administered 2023-11-15: 4 mg via INTRAVENOUS
  Filled 2023-11-15: qty 2

## 2023-11-15 MED ORDER — ALUM & MAG HYDROXIDE-SIMETH 200-200-20 MG/5ML PO SUSP
30.0000 mL | Freq: Once | ORAL | Status: AC
  Administered 2023-11-16: 30 mL via ORAL
  Filled 2023-11-15: qty 30

## 2023-11-15 MED ORDER — LIDOCAINE VISCOUS HCL 2 % MT SOLN
15.0000 mL | Freq: Once | OROMUCOSAL | Status: AC
  Administered 2023-11-16: 15 mL via ORAL
  Filled 2023-11-15: qty 15

## 2023-11-15 NOTE — ED Triage Notes (Signed)
Pt reports he has been vomiting since 6 am this morning and denies any diarrhea or constipation.  Pt reports he has upper abdominal pain and rib pain.

## 2023-11-16 ENCOUNTER — Emergency Department (HOSPITAL_COMMUNITY): Payer: 59

## 2023-11-16 LAB — D-DIMER, QUANTITATIVE: D-Dimer, Quant: 0.58 ug{FEU}/mL — ABNORMAL HIGH (ref 0.00–0.50)

## 2023-11-16 LAB — TROPONIN I (HIGH SENSITIVITY): Troponin I (High Sensitivity): 6 ng/L (ref <18)

## 2023-11-16 MED ORDER — FAMOTIDINE 20 MG PO TABS: 20.0000 mg | ORAL_TABLET | Freq: Two times a day (BID) | ORAL | 0 refills | Status: DC | PRN

## 2023-11-16 MED ORDER — PANTOPRAZOLE SODIUM 40 MG PO TBEC: 40.0000 mg | DELAYED_RELEASE_TABLET | Freq: Every day | ORAL | 0 refills | Status: DC

## 2023-11-16 MED ORDER — IOHEXOL 350 MG/ML SOLN
100.0000 mL | Freq: Once | INTRAVENOUS | Status: AC | PRN
  Administered 2023-11-16: 75 mL via INTRAVENOUS

## 2023-11-16 MED ORDER — MAALOX MAX 400-400-40 MG/5ML PO SUSP: 10.0000 mL | Freq: Four times a day (QID) | ORAL | 0 refills | Status: DC | PRN

## 2023-11-16 NOTE — ED Notes (Signed)
Patient transported to CT 

## 2023-11-16 NOTE — ED Provider Notes (Signed)
[place] EMERGENCY DEPARTMENT AT [facility] Provider Note  CSN: [id number] Arrival date & time: 11/15/23 2220  Chief Complaint(s) Emesis  HPI Mitchell Quinn is a 32 y.o. male with PMH HTN, IBS who presents emergency room for evaluation of emesis, abdominal pain, chest pain.  Patient states he has had multiple episodes of vomiting since 6 AM this morning with associated upper abdominal pain and chest pain.  States that he has had similar symptoms to this for years and was previously evaluated in September of this year for something similar.  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  States that he has a family history of severe acid reflux requiring an esophagectomy and his mother.  Denies associated shortness of breath, headache, fever or other systemic symptoms.   Past Medical History Past Medical History:  Diagnosis Date   Hypertension    IBS (irritable bowel syndrome)    Pneumonia    Patient Active Problem List   Diagnosis Date Noted   Acute respiratory disease due to COVID-19 virus 09/02/2019   Pneumonia due to COVID-19 virus 09/02/2019   Respiratory Hypoxic failure, acute -Covid Related 09/02/2019   IRRITABLE BOWEL SYNDROME 10/23/2010   FOLLICULITIS 10/23/2010   Abdominal pain 10/03/2010   DIARRHEA 09/20/2010   Sprain of ankle 03/26/2010   Home Medication(s) Prior to Admission medications   Medication Sig Start Date End Date Taking? Authorizing Provider  alum & mag hydroxide-simeth (MAALOX MAX) 400-400-40 MG/5ML suspension Take 10 mLs by mouth every 6 (six) hours as needed for indigestion. 11/16/23  Yes Tamla Winkels, MD  famotidine (PEPCID) 20 MG tablet Take 1 tablet (20 mg total) by mouth 2 (two) times daily as needed for heartburn or indigestion. 11/16/23  Yes Geryl Dohn, MD  albuterol (VENTOLIN HFA) 108 (90 Base) MCG/ACT inhaler Inhale 2 puffs into the lungs every 4 (four) hours as needed for wheezing or shortness  of breath. 09/11/21   Merrilee Jansky, MD  losartan (COZAAR) 25 MG tablet Take 25 mg by mouth daily. 08/16/23   [provider]  pantoprazole (PROTONIX) 40 MG tablet Take 1 tablet (40 mg total) by mouth daily. 11/16/23   Kamaria Lucia, Wyn Forster, MD                                                                                                                                    Past Surgical History Past Surgical History:  Procedure Laterality Date   ORIF WRIST FRACTURE     Family History Family History  Problem Relation Age of Onset   Healthy Mother    Healthy Father     Social History Social History   Tobacco Use   Smoking status: Former    Types: Cigarettes    Passive exposure: Never   Smokeless tobacco: Former    Types: Snuff  Vaping Use   Vaping status: Every Day  Substance  Use Topics   Alcohol use: No   Drug use: Never   Allergies Patient has no known allergies.  Review of Systems Review of Systems  Cardiovascular:  Positive for chest pain.  Gastrointestinal:  Positive for abdominal pain, nausea and vomiting.    Physical Exam Vital Signs  I have reviewed the triage vital signs BP 136/89 (BP Location: Right Arm)   Pulse (!) 53   Temp 99.1 F (37.3 C) (Oral)   Resp 20   Ht 5\' 11"  (1.803 m)   Wt 111.6 kg   SpO2 96%   BMI 34.31 kg/m   Physical Exam Constitutional:      General: He is not in acute distress.    Appearance: Normal appearance.  HENT:     Head: Normocephalic and atraumatic.     Nose: No congestion or rhinorrhea.  Eyes:     General:        Right eye: No discharge.        Left eye: No discharge.     Extraocular Movements: Extraocular movements intact.     Pupils: Pupils are equal, round, and reactive to light.  Cardiovascular:     Rate and Rhythm: Normal rate and regular rhythm.     Heart sounds: No murmur heard. Pulmonary:     Effort: No respiratory distress.     Breath sounds: No wheezing or rales.  Abdominal:     General: There  is no distension.     Tenderness: There is abdominal tenderness.  Musculoskeletal:        General: Normal range of motion.     Cervical back: Normal range of motion.  Skin:    General: Skin is warm and dry.  Neurological:     General: No focal deficit present.     Mental Status: He is alert.     ED Results and Treatments Labs (all labs ordered are listed, but only abnormal results are displayed) Labs Reviewed  COMPREHENSIVE METABOLIC PANEL - Abnormal; Notable for the following components:      Result Value   Glucose, Bld 109 (*)    All other components within normal limits  CBC WITH DIFFERENTIAL/PLATELET - Abnormal; Notable for the following components:   WBC 13.2 (*)    Neutro Abs 9.8 (*)    Abs Immature Granulocytes 0.09 (*)    All other components within normal limits  D-DIMER, QUANTITATIVE - Abnormal; Notable for the following components:   D-Dimer, Quant 0.58 (*)    All other components within normal limits  LIPASE, BLOOD  TROPONIN I (HIGH SENSITIVITY)                                                                                                                          Radiology CT Angio Chest PE W and/or Wo Contrast  Result Date: 11/16/2023 CLINICAL DATA:  Pulmonary embolism (PE) suspected, low to intermediate prob, positive D-dimer. Upper abdominal pain. EXAM: CT ANGIOGRAPHY CHEST WITH CONTRAST TECHNIQUE: Multidetector  CT imaging of the chest was performed using the standard protocol during bolus administration of intravenous contrast. Multiplanar CT image reconstructions and MIPs were obtained to evaluate the vascular anatomy. RADIATION DOSE REDUCTION: This exam was performed according to the departmental dose-optimization program which includes automated exposure control, adjustment of the mA and/or kV according to patient size and/or use of iterative reconstruction technique. CONTRAST:  75mL OMNIPAQUE IOHEXOL 350 MG/ML SOLN COMPARISON:  01/04/2023 FINDINGS:  Cardiovascular: No filling defects in the pulmonary arteries to suggest pulmonary emboli. Heart is normal size. Aorta is normal caliber. Mediastinum/Nodes: No mediastinal, hilar, or axillary adenopathy. Trachea and esophagus are unremarkable. Thyroid unremarkable. Lungs/Pleura: Lungs are clear. No focal airspace opacities or suspicious nodules. No effusions. Upper Abdomen: No acute findings Musculoskeletal: Chest wall soft tissues are unremarkable. No acute bony abnormality. Review of the MIP images confirms the above findings. IMPRESSION: No evidence of pulmonary embolus. No acute cardiopulmonary disease. Electronically Signed   By: Charlett Nose M.D.   On: 11/16/2023 00:48    Pertinent labs & imaging results that were available during my care of the patient were reviewed by me and considered in my medical decision making (see MDM for details).  Medications Ordered in ED Medications  ondansetron (ZOFRAN) injection 4 mg (4 mg Intravenous Given 11/15/23 2300)  alum & mag hydroxide-simeth (MAALOX/MYLANTA) 200-200-20 MG/5ML suspension 30 mL (30 mLs Oral Given 11/16/23 0000)    And  lidocaine (XYLOCAINE) 2 % viscous mouth solution 15 mL (15 mLs Oral Given 11/16/23 0001)  iohexol (OMNIPAQUE) 350 MG/ML injection 100 mL (75 mLs Intravenous Contrast Given 11/16/23 0038)                                                                                                                                     Procedures Procedures  (including critical care time)  Medical Decision Making / ED Course   This patient presents to the ED for concern of chest pain, abdominal pain, vomiting, this involves an extensive number of treatment options, and is a complaint that carries with it a high risk of complications and morbidity.  The differential diagnosis includes ACS, Aortic Dissection, Pneumothorax, Pneumonia, Esophageal Rupture, PE, Tamponade/Pericardial Effusion, pericarditis, esophageal spasm, dysrhythmia, GERD,  costochondritis, GERD/gastritis, peptic ulcer disease, pancreatitis, gastroparesis, pneumonia, pleurisy, pericarditis  MDM: Patient seen emergency room for evaluation of chest and abdominal pain with vomiting.  Physical exam with some mild epigastric tenderness to palpation but is otherwise unremarkable.  Laboratory evaluation with a leukocytosis to 13.2 likely stress demargination from vomiting.  D-dimer slightly elevated 0.58.  High-sensitivity troponin negative, lipase normal.  CT PE obtained in the setting of elevated D-dimer that was reassuringly negative.  Patient presentation appears consistent with GERD and gastritis and he received a GI cocktail and on reevaluation his symptoms have significant improved.  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  I had an extended discussion with the patient about acid reflux, diet and have appropriate use gastritis specific medications.  He voiced understanding of this and I placed an outpatient referral to gastroenterology.  Very low suspicion for ACS at this time given negative troponins and no exertional component to the pain with no associated shortness of breath.  At this time he does not meet inpatient criteria for admission and will be discharged with outpatient gastroenterology follow-up.  Given return precautions of which she voiced understanding he was discharged.   Additional history obtained:  -External records from outside source obtained and reviewed including: Chart review including previous notes, labs, imaging, consultation notes   Lab Tests: -I ordered, reviewed, and interpreted labs.   The pertinent results include:   Labs Reviewed  COMPREHENSIVE METABOLIC PANEL - Abnormal; Notable for the following components:      Result Value   Glucose, Bld 109 (*)    All other components within normal limits  CBC WITH DIFFERENTIAL/PLATELET - Abnormal;  Notable for the following components:   WBC 13.2 (*)    Neutro Abs 9.8 (*)    Abs Immature Granulocytes 0.09 (*)    All other components within normal limits  D-DIMER, QUANTITATIVE - Abnormal; Notable for the following components:   D-Dimer, Quant 0.58 (*)    All other components within normal limits  LIPASE, BLOOD  TROPONIN I (HIGH SENSITIVITY)     Imaging Studies ordered: I ordered imaging studies including CT PE I independently visualized and interpreted imaging. I agree with the radiologist interpretation   Medicines ordered and prescription drug management: Meds ordered this encounter  Medications   ondansetron (ZOFRAN) injection 4 mg   AND Linked Order Group    alum & mag hydroxide-simeth (MAALOX/MYLANTA) 200-200-20 MG/5ML suspension 30 mL    lidocaine (XYLOCAINE) 2 % viscous mouth solution 15 mL   iohexol (OMNIPAQUE) 350 MG/ML injection 100 mL   alum & mag hydroxide-simeth (MAALOX MAX) 400-400-40 MG/5ML suspension    Sig: Take 10 mLs by mouth every 6 (six) hours as needed for indigestion.    Dispense:  355 mL    Refill:  0   famotidine (PEPCID) 20 MG tablet    Sig: Take 1 tablet (20 mg total) by mouth 2 (two) times daily as needed for heartburn or indigestion.    Dispense:  30 tablet    Refill:  0   pantoprazole (PROTONIX) 40 MG tablet    Sig: Take 1 tablet (40 mg total) by mouth daily.    Dispense:  30 tablet    Refill:  0    -I have reviewed the patients home medicines and have made adjustments as needed  Critical interventions none    Cardiac Monitoring: The patient was maintained on a cardiac monitor.  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, on the road eating fast food frequently   Reevaluation: After the interventions noted above, I reevaluated the patient and found  that they have :improved  Co morbidities that complicate the patient evaluation  Past Medical History:  Diagnosis Date   Hypertension    IBS (irritable bowel syndrome)    Pneumonia       Dispostion: I considered admission for this patient, but at this time he does not meet inpatient criteria for admission and he  will be discharged with outpatient follow-up with return precautions of which she voiced understanding     Final Clinical Impression(s) / ED Diagnoses Final diagnoses:  Epigastric pain  Chest pain, unspecified type  Acute gastritis without hemorrhage, unspecified gastritis type     @PCDICTATION @    Glendora Score, MD 11/16/23 0148

## 2023-11-16 NOTE — Discharge Instructions (Addendum)
You were seen in the emergency department for evaluation of abdominal and chest pain.  Your workup today overall was reassuring and your symptoms are consistent with severe reflux.  Below will be our reflux plan and please call the number above to set up an appointment with the gastroenterologist (GI).   Plan: - Pantoprazole daily (preventative) -Maalox (liquid) and famotidine (pill) if having an active attack -Avoid late time meals -Sleep propped up  -Avoid high risk foods such as chocolate, tomatoes, alcohol and coffee

## 2023-12-05 ENCOUNTER — Ambulatory Visit (INDEPENDENT_AMBULATORY_CARE_PROVIDER_SITE_OTHER): Payer: 59 | Admitting: Gastroenterology

## 2023-12-05 ENCOUNTER — Telehealth: Payer: Self-pay | Admitting: *Deleted

## 2023-12-05 ENCOUNTER — Encounter: Payer: Self-pay | Admitting: Gastroenterology

## 2023-12-05 ENCOUNTER — Encounter: Payer: Self-pay | Admitting: *Deleted

## 2023-12-05 VITALS — BP 125/80 | HR 57 | Temp 98.6°F | Ht 71.0 in | Wt 250.8 lb

## 2023-12-05 DIAGNOSIS — R1319 Other dysphagia: Secondary | ICD-10-CM

## 2023-12-05 DIAGNOSIS — R1010 Upper abdominal pain, unspecified: Secondary | ICD-10-CM

## 2023-12-05 DIAGNOSIS — K219 Gastro-esophageal reflux disease without esophagitis: Secondary | ICD-10-CM | POA: Diagnosis not present

## 2023-12-05 DIAGNOSIS — R1013 Epigastric pain: Secondary | ICD-10-CM | POA: Diagnosis not present

## 2023-12-05 DIAGNOSIS — R0789 Other chest pain: Secondary | ICD-10-CM

## 2023-12-05 DIAGNOSIS — R110 Nausea: Secondary | ICD-10-CM

## 2023-12-05 DIAGNOSIS — R1310 Dysphagia, unspecified: Secondary | ICD-10-CM | POA: Insufficient documentation

## 2023-12-05 DIAGNOSIS — R112 Nausea with vomiting, unspecified: Secondary | ICD-10-CM | POA: Insufficient documentation

## 2023-12-05 MED ORDER — LIDOCAINE VISCOUS HCL 2 % MT SOLN: 15.0000 mL | Freq: Four times a day (QID) | OROMUCOSAL | 1 refills | Status: DC | PRN

## 2023-12-05 NOTE — Progress Notes (Signed)
GI Office Note    Referring Provider: Estanislado Pandy, MD Primary Care Physician:  Estanislado Pandy, MD  Primary Gastroenterologist: Roetta Sessions, MD   Chief Complaint   Chief Complaint  Patient presents with   New Patient (Initial Visit)    Epigastric pain, food not digesting .food get stuck in his esophagus. A lot of hospital visits     History of Present Illness   Mitchell Quinn is a 32 y.o. male presenting today at the request of the ED provider for further evaluation of epigastric pain/chest pain.  Patient seen in the ED November 17 with abdominal pain, chest pain, vomiting. He has had multiple ED visits for similar symptoms. His symptoms consist of typical reflux type symptoms, worse with certain foods, worse if not sleeping reclined or waiting long enough between meals and laying down. However he develops not only burning type pain but chest pain which radiates into his back. He has pain in the epigastric region. Often does have vomiting. Vomiting becoming more frequent. Sometimes has diaphoresis with it. He has vomiting about 3 times per week. He is careful to drink plenty of water with his meals to help food go down. Sometimes feels like food is getting stuck but usually this feeling occurs after he has eating, not during the meal. BM alternate between constipation/diarrhea. No melena, brbpr. Intentional weight loss of 22 pounds.   Last night he did not sleep reclined and this morning he is having chest burning, chest feels tight and radiating into his back. After recent ED visit, he was started on pantoprazole. He has not noted significant improvement. He does note that with flares famotidine does seem to help. Notes GI cocktail in the ER helped a lot.   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    Mom has history of achalasia, her symptoms started in her 30s. States she had Heller myotomy with dor fundoplication at age 48.  She states her symptoms were very similar to what her son is experiencing.     In the ED: CTA chest for elevated D-dimer (0.58) completed with no evidence of PE.  Troponin I normal, white blood cell count 13,200, hemoglobin 13.5, platelets 292,000, sodium 137, potassium 3.8, creatinine 0.97, albumin 4.3, total bilirubin 0.6, alkaline phosphatase 57, AST 18, ALT 18, lipase 43.  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  Random colon biopsies performed, benign with hyperplastic lymphoid aggregates, no active colitis. -Normal esophagus -Mild patchy erythema in the antrum, chronic gastritis with no H. pylori -Normal duodenal bulb, ampulla, second portion of duodenum status post biopsies, active duodenitis.  Patchy mostly mild active duodenitis associated with preservation of the villous architecture and no granulomata.  Differential diagnosis includes infection versus inflammatory bowel disease.  No evidence of eosinophilic duodenitis or celiac.  Medications   Current Outpatient Medications  Medication Sig Dispense Refill   famotidine (PEPCID) 20 MG tablet Take 1 tablet (20 mg total) by mouth 2 (two) times daily as needed for heartburn or indigestion. 30 tablet 0   pantoprazole (PROTONIX) 40 MG tablet Take 1 tablet (40 mg total) by mouth daily. 30 tablet 0   No current facility-administered medications for this visit.    Allergies   Allergies as of 12/05/2023   (No Known Allergies)    Past Medical  History   Past Medical History:  Diagnosis Date   Hypertension    IBS (irritable bowel syndrome)    Pneumonia     Past Surgical History   Past Surgical History:  Procedure Laterality Date   ORIF WRIST FRACTURE      Past Family History   Family History  Problem Relation Age of Onset   Healthy Mother     Achalasia Mother    Healthy Father    Other Cousin        lower intestines removed, not working anymore   Colon cancer Neg Hx     Past Social History   Social History   Socioeconomic History   Marital status: Married    Spouse name: Not on file   Number of children: Not on file   Years of education: Not on file   Highest education level: Not on file  Occupational History   Not on file  Tobacco Use   Smoking status: Former    Types: Cigarettes    Passive exposure: Never   Smokeless tobacco: Former    Types: Snuff  Vaping Use   Vaping status: Every Day  Substance and Sexual Activity   Alcohol use: No   Drug use: Never   Sexual activity: Yes  Other Topics Concern   Not on file  Social History Narrative   Not on file   Social Determinants of Health   Financial Resource Strain: Not on file  Food Insecurity: Not on file  Transportation Needs: Not on file  Physical Activity: Not on file  Stress: Not on file  Social Connections: Not on file  Intimate Partner Violence: Not on file    Review of Systems   General: Negative for anorexia, weight loss, fever, chills, fatigue, weakness. Eyes: Negative for vision changes.  ENT: Negative for hoarseness,  nasal congestion. CV: Negative for chest pain, angina, palpitations, dyspnea on exertion, peripheral edema.  Respiratory: Negative for dyspnea at rest, dyspnea on exertion, cough, sputum, wheezing.  GI: See history of present illness. GU:  Negative for dysuria, hematuria, urinary incontinence, urinary frequency, nocturnal urination.  MS: Negative for joint pain, low back pain.  Derm: Negative for rash or itching.  Neuro: Negative for weakness, abnormal sensation, seizure, frequent headaches, memory loss,  confusion.  Psych: Negative for anxiety, depression, suicidal ideation, hallucinations.  Endo: Negative for unusual weight change.  Heme: Negative for bruising or bleeding. Allergy: Negative for rash or  hives.  Physical Exam   BP 125/80   Pulse (!) 57   Temp 98.6 F (37 C)   Ht 5\' 11"  (1.803 m)   Wt 250 lb 12.8 oz (113.8 kg)   BMI 34.98 kg/m    General: Well-nourished, well-developed in no acute distress.  Head: Normocephalic, atraumatic.   Eyes: Conjunctiva pink, no icterus. Mouth: Oropharyngeal mucosa moist and pink  Neck: Supple without thyromegaly, masses, or lymphadenopathy.  Lungs: Clear to auscultation bilaterally.  Heart: Regular rate and rhythm, no murmurs rubs or gallops.  Abdomen: Bowel sounds are normal, nontender, nondistended, no hepatosplenomegaly or masses,  no abdominal bruits or hernia, no rebound or guarding.   Rectal: not performed Extremities: No lower extremity edema. No clubbing or deformities.  Neuro: Alert and oriented x 4 , grossly normal neurologically.  Skin: Warm and dry, no rash or jaundice.   Psych: Alert and cooperative, normal mood and affect.  Labs   Lab Results  Component Value Date   NA 137 11/15/2023   CL 102 11/15/2023  K 3.8 11/15/2023   CO2 27 11/15/2023   BUN 10 11/15/2023   CREATININE 0.97 11/15/2023   GFRNONAA >60 11/15/2023   CALCIUM 9.2 11/15/2023   ALBUMIN 4.3 11/15/2023   GLUCOSE 109 (H) 11/15/2023   Lab Results  Component Value Date   ALT 18 11/15/2023   AST 18 11/15/2023   ALKPHOS 57 11/15/2023   BILITOT 0.6 11/15/2023   Lab Results  Component Value Date   WBC 13.2 (H) 11/15/2023   HGB 13.5 11/15/2023   HCT 39.0 11/15/2023   MCV 89.4 11/15/2023   PLT 292 11/15/2023   Lab Results  Component Value Date   LIPASE 43 11/15/2023    Imaging Studies   CT Angio Chest PE W and/or Wo Contrast  Result Date: 11/16/2023 CLINICAL DATA:  Pulmonary embolism (PE) suspected, low to intermediate prob, positive D-dimer. Upper abdominal pain. EXAM: CT ANGIOGRAPHY CHEST WITH CONTRAST TECHNIQUE: Multidetector CT imaging of the chest was performed using the standard protocol during bolus administration of intravenous  contrast. Multiplanar CT image reconstructions and MIPs were obtained to evaluate the vascular anatomy. RADIATION DOSE REDUCTION: This exam was performed according to the departmental dose-optimization program which includes automated exposure control, adjustment of the mA and/or kV according to patient size and/or use of iterative reconstruction technique. CONTRAST:  75mL OMNIPAQUE IOHEXOL 350 MG/ML SOLN COMPARISON:  01/04/2023 FINDINGS: Cardiovascular: No filling defects in the pulmonary arteries to suggest pulmonary emboli. Heart is normal size. Aorta is normal caliber. Mediastinum/Nodes: No mediastinal, hilar, or axillary adenopathy. Trachea and esophagus are unremarkable. Thyroid unremarkable. Lungs/Pleura: Lungs are clear. No focal airspace opacities or suspicious nodules. No effusions. Upper Abdomen: No acute findings Musculoskeletal: Chest wall soft tissues are unremarkable. No acute bony abnormality. Review of the MIP images confirms the above findings. IMPRESSION: No evidence of pulmonary embolus. No acute cardiopulmonary disease. Electronically Signed   By: Charlett Nose M.D.   On: 11/16/2023 00:48    Assessment/Plan:   GERD/epigastric pain/dysphagia/vomiting: symptoms may be secondary to reflux with complicating features such as stricture, eosinophilic esophagitis, esophageal motility disorder, less likely biliary etiology  -increase pantoprazole 40mg  twice daily before a meal -continue famotidine 20mg  twice daily as needed -viscous lidocaine 15mL every 6 hours as needed to be used only for significant flare to hopefully prevent need for ED visit -antireflux measures -gerd diet -EGD+/-ED with Dr. Jena Gauss. ASA 2.  I have discussed the risks, alternatives, benefits with regards to but not limited to the risk of reaction to medication, bleeding, infection, perforation and the patient is agreeable to proceed. Written consent to be obtained.        Leanna Battles. Melvyn Neth, MHS, PA-C [facility]

## 2023-12-05 NOTE — Telephone Encounter (Signed)
[employer] PA: CPT Code GEEGD Description: Esophagogastroduodenoscopy Case Number: [id number] Review Date: 12/05/2023 9:52:53 AM Expiration Date: N/A Status: This member is not in scope for prior-authorization/notification for the services requested.

## 2023-12-05 NOTE — Patient Instructions (Addendum)
Increase pantoprazole to 40mg  once in the morning and once in the evening, 30 minutes before a meal/snack. Continue famotidine 20mg  up to twice daily as needed for breakthrough symptoms.  Viscous lidocaine provided to give relief of severe pain and to hopefully avoid need for ER visit.  Upper endoscopy to be scheduled.  Please make sure you sleep propped up, you can purchase a pillow wedge which is helpful. Allow 3 hours between eating and laying down. Try not to overeat, eating multiple small meals is helpful. Avoid fatty/greasy/spicy foods.

## 2023-12-07 ENCOUNTER — Emergency Department (HOSPITAL_COMMUNITY)
Admission: EM | Admit: 2023-12-07 | Discharge: 2023-12-07 | Disposition: A | Discharge status: Home / Self Care | Payer: 59 | Attending: Emergency Medicine | Admitting: Emergency Medicine

## 2023-12-07 ENCOUNTER — Encounter (HOSPITAL_COMMUNITY): Payer: Self-pay

## 2023-12-07 ENCOUNTER — Other Ambulatory Visit: Payer: Self-pay

## 2023-12-07 ENCOUNTER — Emergency Department (HOSPITAL_COMMUNITY): Payer: 59

## 2023-12-07 DIAGNOSIS — K219 Gastro-esophageal reflux disease without esophagitis: Secondary | ICD-10-CM | POA: Diagnosis not present

## 2023-12-07 DIAGNOSIS — R0789 Other chest pain: Secondary | ICD-10-CM | POA: Diagnosis present

## 2023-12-07 LAB — CBC WITH DIFFERENTIAL/PLATELET
Abs Immature Granulocytes: 0.04 10*3/uL (ref 0.00–0.07)
Basophils Absolute: 0.1 10*3/uL (ref 0.0–0.1)
Basophils Relative: 1 %
Eosinophils Absolute: 0.3 10*3/uL (ref 0.0–0.5)
Eosinophils Relative: 3 %
HCT: 39.1 % (ref 39.0–52.0)
Hemoglobin: 13.7 g/dL (ref 13.0–17.0)
Immature Granulocytes: 1 %
Lymphocytes Relative: 31 %
Lymphs Abs: 2.5 10*3/uL (ref 0.7–4.0)
MCH: 31.4 pg (ref 26.0–34.0)
MCHC: 35 g/dL (ref 30.0–36.0)
MCV: 89.5 fL (ref 80.0–100.0)
Monocytes Absolute: 0.6 10*3/uL (ref 0.1–1.0)
Monocytes Relative: 7 %
Neutro Abs: 4.5 10*3/uL (ref 1.7–7.7)
Neutrophils Relative %: 57 %
Platelets: 293 10*3/uL (ref 150–400)
RBC: 4.37 MIL/uL (ref 4.22–5.81)
RDW: 12.1 % (ref 11.5–15.5)
WBC: 7.9 10*3/uL (ref 4.0–10.5)
nRBC: 0 % (ref 0.0–0.2)

## 2023-12-07 LAB — BASIC METABOLIC PANEL
Anion gap: 9 (ref 5–15)
BUN: 12 mg/dL (ref 6–20)
CO2: 26 mmol/L (ref 22–32)
Calcium: 9.3 mg/dL (ref 8.9–10.3)
Chloride: 104 mmol/L (ref 98–111)
Creatinine, Ser: 0.94 mg/dL (ref 0.61–1.24)
GFR, Estimated: >60 mL/min (ref >60)
Glucose, Bld: 114 mg/dL — ABNORMAL HIGH (ref 70–99)
Potassium: 3.5 mmol/L (ref 3.5–5.1)
Sodium: 139 mmol/L (ref 135–145)

## 2023-12-07 LAB — TROPONIN I (HIGH SENSITIVITY): Troponin I (High Sensitivity): 5 ng/L (ref <18)

## 2023-12-07 MED ORDER — ALUM & MAG HYDROXIDE-SIMETH 200-200-20 MG/5ML PO SUSP
30.0000 mL | Freq: Once | ORAL | Status: AC
  Administered 2023-12-07: 30 mL via ORAL
  Filled 2023-12-07: qty 30

## 2023-12-07 NOTE — ED Triage Notes (Signed)
Pt arrived via POV from home c/o chest pain X 1 hr. Pts parents present and report Pt may have achalasia. Pt has upcoming appt to be evaluated for this. Pt reports he took some vicous lidocaine PTA with minimal improvement.

## 2023-12-07 NOTE — ED Provider Notes (Signed)
[place] EMERGENCY DEPARTMENT AT [facility]  Provider Note  CSN: [id number] Arrival date & time: 12/07/23 0139  History Chief Complaint  Patient presents with   Chest Pain    SEN HARPENAU is a 32 y.o. male with history of GERD, has been evaluated by GI recently and scheduled for EGD in January. He has frequent episodes of vomiting, reflux, chest tightness and laryngospasms. He is taking Protonix, famotidine and has viscous lidocaine as needed. He was having more severe symptoms since dinner tonight prompting his ED visit. He has had several prior ED visits for similar, negative cardiac workup. Usually gets some relief with GI cocktail.    Home Medications Prior to Admission medications   Medication Sig Start Date End Date Taking? Authorizing Provider  famotidine (PEPCID) 20 MG tablet Take 1 tablet (20 mg total) by mouth 2 (two) times daily as needed for heartburn or indigestion. 11/16/23  Yes Kommor, Madison, MD  lidocaine (XYLOCAINE) 2 % solution Use as directed 15 mLs in the mouth or throat every 6 (six) hours as needed (chest pain/gerd). 12/05/23  Yes Tiffany Kocher, PA-C  pantoprazole (PROTONIX) 40 MG tablet Take 1 tablet (40 mg total) by mouth daily. 11/16/23  Yes Kommor, Madison, MD     Allergies    Patient has no known allergies.   Review of Systems   Review of Systems Please see HPI for pertinent positives and negatives  Physical Exam BP (!) 144/93   Pulse (!) 59   Temp 97.8 F (36.6 C) (Oral)   Resp 19   Ht 5\' 11"  (1.803 m)   Wt 114 kg   SpO2 96%   BMI 35.05 kg/m   Physical Exam Vitals and nursing note reviewed.  Constitutional:      Appearance: Normal appearance.  HENT:     Head: Normocephalic and atraumatic.     Nose: Nose normal.     Mouth/Throat:     Mouth: Mucous membranes are moist.  Eyes:     Extraocular Movements: Extraocular movements intact.     Conjunctiva/sclera: Conjunctivae normal.  Cardiovascular:     Rate and Rhythm:  Normal rate.  Pulmonary:     Effort: Pulmonary effort is normal.     Breath sounds: Normal breath sounds.  Abdominal:     General: Abdomen is flat.     Palpations: Abdomen is soft.     Tenderness: There is no abdominal tenderness.  Musculoskeletal:        General: No swelling. Normal range of motion.     Cervical back: Neck supple.  Skin:    General: Skin is warm and dry.  Neurological:     General: No focal deficit present.     Mental Status: He is alert.  Psychiatric:        Mood and Affect: Mood normal.     ED Results / Procedures / Treatments   EKG EKG Interpretation Date/Time:  Sunday December 07 2023 01:53:41 EST Ventricular Rate:  52 PR Interval:  209 QRS Duration:  107 QT Interval:  422 QTC Calculation: 393 R Axis:   50  Text Interpretation: Sinus arrhythmia Borderline prolonged PR interval ST elev, probable normal early repol pattern Baseline wander in lead(s) V1 No significant change since last tracing Confirmed by Susy Frizzle [phone]) on 12/07/2023 2:57:49 AM  Procedures Procedures  Medications Ordered in the ED Medications  alum & mag hydroxide-simeth (MAALOX/MYLANTA) 200-200-20 MG/5ML suspension 30 mL (30 mLs Oral Given 12/07/23 0312)  Initial Impression and Plan  Patient here with atypical chest pain, most consistent with GI cause, ongoing for several hours this episodes but months overall. Exam and vitals are reassuring. Labs done in triage show normal CBC, BMP and Trop. Given duration of symptoms, delta is not indicated. Will give GI cocktail for comfort. Recommend continued management by GI, consider asking for earlier endoscopy if symptoms persist.   ED Course       MDM Rules/Calculators/A&P Medical Decision Making Problems Addressed: Atypical chest pain: acute illness or injury Gastroesophageal reflux disease, unspecified whether esophagitis present: chronic illness or injury with exacerbation, progression, or side effects of  treatment  Amount and/or Complexity of Data Reviewed Labs: ordered. Decision-making details documented in ED Course. Radiology: ordered and independent interpretation performed. Decision-making details documented in ED Course. ECG/medicine tests: ordered and independent interpretation performed. Decision-making details documented in ED Course.  Risk OTC drugs.     Final Clinical Impression(s) / ED Diagnoses Final diagnoses:  Atypical chest pain  Gastroesophageal reflux disease, unspecified whether esophagitis present    Rx / DC Orders ED Discharge Orders     None        Pollyann Savoy, MD 12/07/23 [phone]

## 2023-12-08 ENCOUNTER — Telehealth: Payer: Self-pay | Admitting: *Deleted

## 2023-12-08 ENCOUNTER — Ambulatory Visit: Payer: 59 | Admitting: Gastroenterology

## 2023-12-08 NOTE — Telephone Encounter (Signed)
[facility]  Telephone pre-op date is on Tuesday, 01/13/24.

## 2023-12-09 ENCOUNTER — Encounter: Payer: Self-pay | Admitting: *Deleted

## 2023-12-09 NOTE — Telephone Encounter (Signed)
[facility]. Mailed letter with pre-op date

## 2023-12-09 NOTE — Telephone Encounter (Signed)
[employer] PA: CPT Code GEEGD Description: Esophagogastroduodenoscopy Case Number: [id number] Review Date: 12/09/2023 12:34:00 PM Expiration Date: N/A Status: This member is not in scope for prior-authorization/notification for the services requested. You can save the case reference ID as validation of your request

## 2023-12-09 NOTE — Telephone Encounter (Signed)
Pt informed of pre-op date of telephone call.

## 2023-12-14 ENCOUNTER — Emergency Department (HOSPITAL_COMMUNITY)
Admission: EM | Admit: 2023-12-14 | Discharge: 2023-12-14 | Disposition: A | Discharge status: Home / Self Care | Payer: 59 | Attending: Emergency Medicine | Admitting: Emergency Medicine

## 2023-12-14 ENCOUNTER — Encounter (HOSPITAL_COMMUNITY): Payer: Self-pay

## 2023-12-14 ENCOUNTER — Other Ambulatory Visit: Payer: Self-pay

## 2023-12-14 DIAGNOSIS — D72829 Elevated white blood cell count, unspecified: Secondary | ICD-10-CM | POA: Diagnosis not present

## 2023-12-14 DIAGNOSIS — R1013 Epigastric pain: Secondary | ICD-10-CM | POA: Insufficient documentation

## 2023-12-14 DIAGNOSIS — R079 Chest pain, unspecified: Secondary | ICD-10-CM | POA: Insufficient documentation

## 2023-12-14 DIAGNOSIS — R112 Nausea with vomiting, unspecified: Secondary | ICD-10-CM | POA: Diagnosis not present

## 2023-12-14 DIAGNOSIS — R0789 Other chest pain: Secondary | ICD-10-CM

## 2023-12-14 HISTORY — DX: Gastro-esophageal reflux disease without esophagitis: K21.9

## 2023-12-14 LAB — COMPREHENSIVE METABOLIC PANEL
ALT: 16 U/L (ref 0–44)
AST: 17 U/L (ref 15–41)
Albumin: 4.4 g/dL (ref 3.5–5.0)
Alkaline Phosphatase: 58 U/L (ref 38–126)
Anion gap: 9 (ref 5–15)
BUN: 12 mg/dL (ref 6–20)
CO2: 26 mmol/L (ref 22–32)
Calcium: 9.3 mg/dL (ref 8.9–10.3)
Chloride: 103 mmol/L (ref 98–111)
Creatinine, Ser: 1.07 mg/dL (ref 0.61–1.24)
GFR, Estimated: >60 mL/min (ref >60)
Glucose, Bld: 107 mg/dL — ABNORMAL HIGH (ref 70–99)
Potassium: 3.6 mmol/L (ref 3.5–5.1)
Sodium: 138 mmol/L (ref 135–145)
Total Bilirubin: 0.7 mg/dL (ref <1.2)
Total Protein: 7.3 g/dL (ref 6.5–8.1)

## 2023-12-14 LAB — CBC WITH DIFFERENTIAL/PLATELET
Abs Immature Granulocytes: 0.07 10*3/uL (ref 0.00–0.07)
Basophils Absolute: 0.1 10*3/uL (ref 0.0–0.1)
Basophils Relative: 1 %
Eosinophils Absolute: 0.2 10*3/uL (ref 0.0–0.5)
Eosinophils Relative: 2 %
HCT: 38.3 % — ABNORMAL LOW (ref 39.0–52.0)
Hemoglobin: 13.4 g/dL (ref 13.0–17.0)
Immature Granulocytes: 1 %
Lymphocytes Relative: 18 %
Lymphs Abs: 2 10*3/uL (ref 0.7–4.0)
MCH: 31.5 pg (ref 26.0–34.0)
MCHC: 35 g/dL (ref 30.0–36.0)
MCV: 90.1 fL (ref 80.0–100.0)
Monocytes Absolute: 0.7 10*3/uL (ref 0.1–1.0)
Monocytes Relative: 6 %
Neutro Abs: 8.2 10*3/uL — ABNORMAL HIGH (ref 1.7–7.7)
Neutrophils Relative %: 72 %
Platelets: 309 10*3/uL (ref 150–400)
RBC: 4.25 MIL/uL (ref 4.22–5.81)
RDW: 12.3 % (ref 11.5–15.5)
WBC: 11.2 10*3/uL — ABNORMAL HIGH (ref 4.0–10.5)
nRBC: 0 % (ref 0.0–0.2)

## 2023-12-14 LAB — TROPONIN I (HIGH SENSITIVITY): Troponin I (High Sensitivity): 5 ng/L (ref <18)

## 2023-12-14 LAB — LIPASE, BLOOD: Lipase: 46 U/L (ref 11–51)

## 2023-12-14 MED ORDER — SODIUM CHLORIDE 0.9 % IV BOLUS
1000.0000 mL | Freq: Once | INTRAVENOUS | Status: AC
  Administered 2023-12-14: 1000 mL via INTRAVENOUS

## 2023-12-14 MED ORDER — ONDANSETRON HCL 4 MG/2ML IJ SOLN
4.0000 mg | Freq: Once | INTRAMUSCULAR | Status: AC
Start: 2023-12-14 — End: 2023-12-14
  Administered 2023-12-14: 4 mg via INTRAVENOUS
  Filled 2023-12-14: qty 2

## 2023-12-14 MED ORDER — KETOROLAC TROMETHAMINE 30 MG/ML IJ SOLN
30.0000 mg | Freq: Once | INTRAMUSCULAR | Status: AC
  Administered 2023-12-14: 30 mg via INTRAVENOUS
  Filled 2023-12-14: qty 1

## 2023-12-14 MED ORDER — LIDOCAINE VISCOUS HCL 2 % MT SOLN
15.0000 mL | Freq: Once | OROMUCOSAL | Status: AC
  Administered 2023-12-14: 15 mL via ORAL
  Filled 2023-12-14: qty 15

## 2023-12-14 MED ORDER — ALUM & MAG HYDROXIDE-SIMETH 200-200-20 MG/5ML PO SUSP
30.0000 mL | Freq: Once | ORAL | Status: AC
  Administered 2023-12-14: 30 mL via ORAL
  Filled 2023-12-14: qty 30

## 2023-12-14 NOTE — Discharge Instructions (Signed)
Increase your Protonix to twice daily for the next 2 weeks.  Follow-up with your gastroenterologist as scheduled, sooner if symptoms worsen.

## 2023-12-14 NOTE — ED Triage Notes (Signed)
Pt to ED cc chest pain and vomiting since 3am yesterday. Was seen recently for same and diagnosed with GERD and has not seen the specialist yet. Vomiting on arrival.

## 2023-12-14 NOTE — ED Notes (Addendum)
Scheduled EGD for 01/16/24 with [facility]

## 2023-12-14 NOTE — ED Provider Notes (Signed)
[place] EMERGENCY DEPARTMENT AT [facility] Provider Note   CSN: [id number] Arrival date & time: 12/14/23  0030     History  Chief Complaint  Patient presents with   Chest Pain    Mitchell Quinn is a 32 y.o. male.  Patient is a 32 year old male with history of irritable bowel and GERD.  Patient presenting today with complaints of chest discomfort, nausea, and vomiting that started earlier today. He describes a burning sensation to his upper abdomen and chest.  No fevers or chills.  No difficulty breathing.  He tried taking lidocaine at home with little relief.  Patient has been having similar episodes and this is his third visit in the past 2 weeks with similar complaints.  He has an upcoming appointment with GI in January.  The history is provided by the patient.       Home Medications Prior to Admission medications   Medication Sig Start Date End Date Taking? Authorizing Provider  famotidine (PEPCID) 20 MG tablet Take 1 tablet (20 mg total) by mouth 2 (two) times daily as needed for heartburn or indigestion. 11/16/23   Kommor, Madison, MD  lidocaine (XYLOCAINE) 2 % solution Use as directed 15 mLs in the mouth or throat every 6 (six) hours as needed (chest pain/gerd). 12/05/23   Tiffany Kocher, PA-C  pantoprazole (PROTONIX) 40 MG tablet Take 1 tablet (40 mg total) by mouth daily. 11/16/23   Kommor, Wyn Forster, MD      Allergies    Patient has no known allergies.    Review of Systems   Review of Systems  All other systems reviewed and are negative.   Physical Exam Updated Vital Signs Ht 5\' 11"  (1.803 m)   Wt 113.9 kg   BMI 35.01 kg/m  Physical Exam Vitals and nursing note reviewed.  Constitutional:      General: He is not in acute distress.    Appearance: He is well-developed. He is not diaphoretic.  HENT:     Head: Normocephalic and atraumatic.  Cardiovascular:     Rate and Rhythm: Normal rate and regular rhythm.     Heart sounds: No murmur  heard.    No friction rub.  Pulmonary:     Effort: Pulmonary effort is normal. No respiratory distress.     Breath sounds: Normal breath sounds. No wheezing or rales.  Abdominal:     General: Bowel sounds are normal. There is no distension.     Palpations: Abdomen is soft.     Tenderness: There is abdominal tenderness.     Comments: There is mild tenderness in the epigastric region.  Musculoskeletal:        General: Normal range of motion.     Cervical back: Normal range of motion and neck supple.  Skin:    General: Skin is warm and dry.  Neurological:     Mental Status: He is alert and oriented to person, place, and time.     Coordination: Coordination normal.     ED Results / Procedures / Treatments   Labs (all labs ordered are listed, but only abnormal results are displayed) Labs Reviewed - No data to display  EKG None  Radiology No results found.  Procedures Procedures    Medications Ordered in ED Medications  alum & mag hydroxide-simeth (MAALOX/MYLANTA) 200-200-20 MG/5ML suspension 30 mL (has no administration in time range)    And  lidocaine (XYLOCAINE) 2 % viscous mouth solution 15 mL (has no administration in time  range)  sodium chloride 0.9 % bolus 1,000 mL (has no administration in time range)  ondansetron (ZOFRAN) injection 4 mg (has no administration in time range)  ketorolac (TORADOL) 30 MG/ML injection 30 mg (has no administration in time range)    ED Course/ Medical Decision Making/ A&P  Patient is a 32 year old male with history of GERD presenting with complaints of epigastric pain, nausea, and vomiting.  This is his third visit in the past 2 weeks with similar episodes and is awaiting GI consultation next month.  Patient arrives here with stable vital signs and is afebrile.  Physical examination reveals mild epigastric tenderness, but is otherwise unremarkable.  Laboratory studies obtained including CBC, CMP, lipase, and troponin.  Other than a  slight leukocytosis with white count of 11.2, laboratory studies are normal.  Patient has been given normal saline along with Zofran and a GI cocktail and is feeling significantly improved.  At this point, patient to be discharged.  I will increase his Prilosec to twice daily for the next 2 weeks and have him follow-up with GI as scheduled.  Final Clinical Impression(s) / ED Diagnoses Final diagnoses:  None    Rx / DC Orders ED Discharge Orders     None         Geoffery Lyons, MD 12/14/23 0200

## 2023-12-20 ENCOUNTER — Other Ambulatory Visit: Payer: Self-pay

## 2023-12-20 ENCOUNTER — Emergency Department (HOSPITAL_COMMUNITY): Payer: 59

## 2023-12-20 ENCOUNTER — Emergency Department (HOSPITAL_COMMUNITY)
Admission: EM | Admit: 2023-12-20 | Discharge: 2023-12-21 | Disposition: A | Discharge status: Home / Self Care | Payer: 59 | Attending: Emergency Medicine | Admitting: Emergency Medicine

## 2023-12-20 ENCOUNTER — Encounter (HOSPITAL_COMMUNITY): Payer: Self-pay

## 2023-12-20 DIAGNOSIS — R079 Chest pain, unspecified: Secondary | ICD-10-CM | POA: Diagnosis present

## 2023-12-20 DIAGNOSIS — R7309 Other abnormal glucose: Secondary | ICD-10-CM | POA: Insufficient documentation

## 2023-12-20 DIAGNOSIS — R112 Nausea with vomiting, unspecified: Secondary | ICD-10-CM | POA: Insufficient documentation

## 2023-12-20 DIAGNOSIS — R739 Hyperglycemia, unspecified: Secondary | ICD-10-CM

## 2023-12-20 LAB — BASIC METABOLIC PANEL
Anion gap: 10 (ref 5–15)
BUN: 10 mg/dL (ref 6–20)
CO2: 27 mmol/L (ref 22–32)
Calcium: 9.2 mg/dL (ref 8.9–10.3)
Chloride: 103 mmol/L (ref 98–111)
Creatinine, Ser: 1.12 mg/dL (ref 0.61–1.24)
GFR, Estimated: >60 mL/min (ref >60)
Glucose, Bld: 125 mg/dL — ABNORMAL HIGH (ref 70–99)
Potassium: 3.6 mmol/L (ref 3.5–5.1)
Sodium: 140 mmol/L (ref 135–145)

## 2023-12-20 LAB — CBC
HCT: 39.3 % (ref 39.0–52.0)
Hemoglobin: 13.9 g/dL (ref 13.0–17.0)
MCH: 31.4 pg (ref 26.0–34.0)
MCHC: 35.4 g/dL (ref 30.0–36.0)
MCV: 88.9 fL (ref 80.0–100.0)
Platelets: 325 10*3/uL (ref 150–400)
RBC: 4.42 MIL/uL (ref 4.22–5.81)
RDW: 12.1 % (ref 11.5–15.5)
WBC: 11.2 10*3/uL — ABNORMAL HIGH (ref 4.0–10.5)
nRBC: 0 % (ref 0.0–0.2)

## 2023-12-20 LAB — TROPONIN I (HIGH SENSITIVITY): Troponin I (High Sensitivity): 4 ng/L (ref <18)

## 2023-12-20 NOTE — ED Triage Notes (Signed)
Chest pain in the center of chest On and off over the last couple of months This time it started 2 hours  Pain  Burning and tightness "Feels like a knot"  Pt complains of SOB and feels like his airway is tight  Vomiting started 45 mins ago

## 2023-12-21 LAB — HEPATIC FUNCTION PANEL
ALT: 15 U/L (ref 0–44)
AST: 18 U/L (ref 15–41)
Albumin: 4.3 g/dL (ref 3.5–5.0)
Alkaline Phosphatase: 61 U/L (ref 38–126)
Bilirubin, Direct: 0.1 mg/dL (ref 0.0–0.2)
Indirect Bilirubin: 0.5 mg/dL (ref 0.3–0.9)
Total Bilirubin: 0.6 mg/dL (ref <1.2)
Total Protein: 7.3 g/dL (ref 6.5–8.1)

## 2023-12-21 LAB — TROPONIN I (HIGH SENSITIVITY): Troponin I (High Sensitivity): 5 ng/L (ref <18)

## 2023-12-21 MED ORDER — PANTOPRAZOLE SODIUM 40 MG IV SOLR
40.0000 mg | Freq: Once | INTRAVENOUS | Status: AC
  Administered 2023-12-21: 40 mg via INTRAVENOUS
  Filled 2023-12-21: qty 10

## 2023-12-21 MED ORDER — ONDANSETRON HCL 4 MG/2ML IJ SOLN
4.0000 mg | Freq: Once | INTRAMUSCULAR | Status: AC
  Administered 2023-12-21: 4 mg via INTRAVENOUS
  Filled 2023-12-21: qty 2

## 2023-12-21 MED ORDER — ONDANSETRON 4 MG PO TBDP: 4.0000 mg | ORAL_TABLET | Freq: Three times a day (TID) | ORAL | 2 refills | Status: DC | PRN

## 2023-12-21 NOTE — Discharge Instructions (Addendum)
It is very important that you take your medicine for acid reflux.  If you are feeling queasy, take a dose of ondansetron before taking your acid reflux medication.  Make sure to follow through with the endoscopy on January 17.  That is a test that is most likely to give you some answers.

## 2023-12-21 NOTE — ED Provider Notes (Signed)
[place] EMERGENCY DEPARTMENT AT [facility] Provider Note   CSN: [id number] Arrival date & time: 12/20/23  2219     History  Chief Complaint  Patient presents with   Chest Pain    Mitchell Quinn is a 32 y.o. male.  The history is provided by the patient.  Chest Pain He has history of GERD, irritable bowel syndrome and comes in because of chest pain and vomiting tonight.  Pain is over the lower sternal area with some radiation to the back.  He describes a burning, tight feeling.  He has vomited numerous times and states that last time it was very bright yellow and foul tasting with a few drops of blood.  He has been having ongoing problems with nausea and vomiting and has not been able to keep his medications down.  He has seen a gastroenterologist and is scheduled for endoscopy next month.   Home Medications Prior to Admission medications   Medication Sig Start Date End Date Taking? Authorizing Provider  famotidine (PEPCID) 20 MG tablet Take 1 tablet (20 mg total) by mouth 2 (two) times daily as needed for heartburn or indigestion. 11/16/23   Kommor, Madison, MD  lidocaine (XYLOCAINE) 2 % solution Use as directed 15 mLs in the mouth or throat every 6 (six) hours as needed (chest pain/gerd). 12/05/23   Tiffany Kocher, PA-C  pantoprazole (PROTONIX) 40 MG tablet Take 1 tablet (40 mg total) by mouth daily. 11/16/23   Kommor, Wyn Forster, MD      Allergies    Patient has no known allergies.    Review of Systems   Review of Systems  Cardiovascular:  Positive for chest pain.  All other systems reviewed and are negative.   Physical Exam Updated Vital Signs BP 130/89 (BP Location: Right Arm)   Pulse 60   Temp 98.3 F (36.8 C) (Oral)   Resp 16   Ht 5\' 11"  (1.803 m)   Wt 108.9 kg   SpO2 100%   BMI 33.47 kg/m  Physical Exam Vitals and nursing note reviewed.   32 year old male, resting comfortably and in no acute distress. Vital signs are normal. Oxygen saturation  is 100%, which is normal. Head is normocephalic and atraumatic. PERRLA, EOMI. Oropharynx is clear. Neck is nontender and supple without adenopathy or JVD. Back is nontender and there is no CVA tenderness. Lungs are clear without rales, wheezes, or rhonchi. Chest is nontender. Heart has regular rate and rhythm without murmur. Abdomen is soft, flat, nontender. Extremities have no cyanosis or edema, full range of motion is present. Skin is warm and dry without rash. Neurologic: Mental status is normal, moves all extremities equally.  ED Results / Procedures / Treatments   Labs (all labs ordered are listed, but only abnormal results are displayed) Labs Reviewed  BASIC METABOLIC PANEL - Abnormal; Notable for the following components:      Result Value   Glucose, Bld 125 (*)    All other components within normal limits  CBC - Abnormal; Notable for the following components:   WBC 11.2 (*)    All other components within normal limits  HEPATIC FUNCTION PANEL  TROPONIN I (HIGH SENSITIVITY)  TROPONIN I (HIGH SENSITIVITY)    EKG CERRONE, BARBUSH MV:[id number] 20-Dec-2023 22:25:26 [facility]-ED ROUTINE RECORD 1991/06/28 (32 yr) Male Caucasian Room:APA05 Loc:906 Technician: tbv Test ind:t->Immediate Vent. rate 65 BPM PR interval 200 ms QRS duration 98 ms QT/QTcB 394/409 ms P-R-T axes 31 14 32  Normal sinus rhythm with sinus arrhythmia Minimal voltage criteria for LVH, may be normal variant ( R in aVL ) Borderline ECG When compared with ECG of 07-Dec-2023 01:53, No significant change was found Confirmed by Dione Booze ([id number]) on 12/20/2023 11:43:44 PM Confirmed By: Dione Booze  Radiology DG Chest 2 View Result Date: 12/20/2023 CLINICAL DATA:  Chest pain and shortness of breath. Central chest pain on and off for 2 months. Burning and tightness. Vomiting starting 45 minutes ago. EXAM: CHEST - 2 VIEW COMPARISON:  12/07/2023 FINDINGS: The heart size and mediastinal  contours are within normal limits. Both lungs are clear. The visualized skeletal structures are unremarkable. IMPRESSION: No active cardiopulmonary disease. Electronically Signed   By: Burman Nieves M.D.   On: 12/20/2023 22:47    Procedures Procedures  Cardiac monitor shows normal sinus rhythm, per my interpretation.  Medications Ordered in ED Medications  ondansetron (ZOFRAN) injection 4 mg (has no administration in time range)  pantoprazole (PROTONIX) injection 40 mg (has no administration in time range)    ED Course/ Medical Decision Making/ A&P                                 Medical Decision Making Amount and/or Complexity of Data Reviewed Labs: ordered. Radiology: ordered.  Risk Prescription drug management.   Recurrent chest pain and vomiting.  I have reviewed his electrocardiogram, and my interpretation is sinus arrhythmia, LVH, unchanged from prior.  Chest x-ray shows no acute process.  I have independently viewed the images, and agree with the radiologist's interpretation.  I have reviewed his laboratory test, and my interpretation is mild leukocytosis which is nonspecific, normal troponin, elevated random glucose level which will need to be followed as an outpatient.  I have reviewed his past records, and he has had numerous ED visits for epigastric pain, chest pain, vomiting over the last 3 months.  Workup has included CT angiogram of the chest on 11/16/2023 which showed no acute process.  At this point, I have no reason to suspect anything other than GERD behind his symptoms.  He states he does not have any medicine for nausea at home.  I have ordered intravenous pantoprazole and ondansetron.  I do note he is scheduled for upper endoscopy on 01/16/2024.  He feels somewhat better following above-noted medication.  I am discharging him with a prescription for ondansetron oral dissolving tablet.  I have encouraged him to keep the appointment for his upper endoscopy.  Final  Clinical Impression(s) / ED Diagnoses Final diagnoses:  Nonspecific chest pain  Nausea and vomiting, unspecified vomiting type  Elevated random blood glucose level    Rx / DC Orders ED Discharge Orders          Ordered    ondansetron (ZOFRAN-ODT) 4 MG disintegrating tablet  Every 8 hours PRN        12/21/23 0134              Dione Booze, MD 12/21/23 [phone]

## 2024-01-01 ENCOUNTER — Encounter (HOSPITAL_COMMUNITY): Payer: Self-pay | Admitting: *Deleted

## 2024-01-01 ENCOUNTER — Encounter (HOSPITAL_COMMUNITY)
Admission: EM | Disposition: A | Discharge status: Home / Self Care | Payer: Self-pay | Source: Home / Self Care | Attending: Emergency Medicine

## 2024-01-01 ENCOUNTER — Emergency Department (HOSPITAL_COMMUNITY): Payer: 59

## 2024-01-01 ENCOUNTER — Emergency Department (HOSPITAL_COMMUNITY)
Admission: EM | Admit: 2024-01-01 | Discharge: 2024-01-01 | Disposition: A | Discharge status: Home / Self Care | Payer: 59 | Attending: Emergency Medicine | Admitting: Emergency Medicine

## 2024-01-01 ENCOUNTER — Telehealth (INDEPENDENT_AMBULATORY_CARE_PROVIDER_SITE_OTHER): Payer: Self-pay | Admitting: Gastroenterology

## 2024-01-01 ENCOUNTER — Other Ambulatory Visit: Payer: Self-pay

## 2024-01-01 ENCOUNTER — Emergency Department (HOSPITAL_COMMUNITY): Payer: 59 | Admitting: Anesthesiology

## 2024-01-01 DIAGNOSIS — K31A19 Gastric intestinal metaplasia without dysplasia, unspecified site: Secondary | ICD-10-CM | POA: Diagnosis not present

## 2024-01-01 DIAGNOSIS — Z79899 Other long term (current) drug therapy: Secondary | ICD-10-CM | POA: Diagnosis not present

## 2024-01-01 DIAGNOSIS — K2970 Gastritis, unspecified, without bleeding: Secondary | ICD-10-CM | POA: Diagnosis not present

## 2024-01-01 DIAGNOSIS — R1112 Projectile vomiting: Secondary | ICD-10-CM | POA: Diagnosis not present

## 2024-01-01 DIAGNOSIS — F17290 Nicotine dependence, other tobacco product, uncomplicated: Secondary | ICD-10-CM | POA: Insufficient documentation

## 2024-01-01 DIAGNOSIS — R1013 Epigastric pain: Secondary | ICD-10-CM | POA: Diagnosis present

## 2024-01-01 DIAGNOSIS — K2950 Unspecified chronic gastritis without bleeding: Secondary | ICD-10-CM | POA: Diagnosis not present

## 2024-01-01 DIAGNOSIS — R1310 Dysphagia, unspecified: Secondary | ICD-10-CM

## 2024-01-01 DIAGNOSIS — R1314 Dysphagia, pharyngoesophageal phase: Secondary | ICD-10-CM | POA: Diagnosis not present

## 2024-01-01 DIAGNOSIS — K219 Gastro-esophageal reflux disease without esophagitis: Secondary | ICD-10-CM | POA: Insufficient documentation

## 2024-01-01 DIAGNOSIS — R1319 Other dysphagia: Secondary | ICD-10-CM

## 2024-01-01 HISTORY — PX: ESOPHAGEAL DILATION: SHX303

## 2024-01-01 HISTORY — PX: BIOPSY: SHX5522

## 2024-01-01 HISTORY — PX: ESOPHAGOGASTRODUODENOSCOPY (EGD) WITH PROPOFOL: SHX5813

## 2024-01-01 LAB — COMPREHENSIVE METABOLIC PANEL
ALT: 17 U/L (ref 0–44)
AST: 19 U/L (ref 15–41)
Albumin: 4.7 g/dL (ref 3.5–5.0)
Alkaline Phosphatase: 61 U/L (ref 38–126)
Anion gap: 9 (ref 5–15)
BUN: 11 mg/dL (ref 6–20)
CO2: 27 mmol/L (ref 22–32)
Calcium: 9.6 mg/dL (ref 8.9–10.3)
Chloride: 102 mmol/L (ref 98–111)
Creatinine, Ser: 0.97 mg/dL (ref 0.61–1.24)
GFR, Estimated: >60 mL/min (ref >60)
Glucose, Bld: 112 mg/dL — ABNORMAL HIGH (ref 70–99)
Potassium: 3.8 mmol/L (ref 3.5–5.1)
Sodium: 138 mmol/L (ref 135–145)
Total Bilirubin: 0.7 mg/dL (ref 0.0–1.2)
Total Protein: 7.6 g/dL (ref 6.5–8.1)

## 2024-01-01 LAB — CBC WITH DIFFERENTIAL/PLATELET
Abs Immature Granulocytes: 0.09 10*3/uL — ABNORMAL HIGH (ref 0.00–0.07)
Basophils Absolute: 0.1 10*3/uL (ref 0.0–0.1)
Basophils Relative: 1 %
Eosinophils Absolute: 0.1 10*3/uL (ref 0.0–0.5)
Eosinophils Relative: 1 %
HCT: 41.3 % (ref 39.0–52.0)
Hemoglobin: 14.2 g/dL (ref 13.0–17.0)
Immature Granulocytes: 1 %
Lymphocytes Relative: 13 %
Lymphs Abs: 1.5 10*3/uL (ref 0.7–4.0)
MCH: 30.6 pg (ref 26.0–34.0)
MCHC: 34.4 g/dL (ref 30.0–36.0)
MCV: 89 fL (ref 80.0–100.0)
Monocytes Absolute: 0.5 10*3/uL (ref 0.1–1.0)
Monocytes Relative: 4 %
Neutro Abs: 8.9 10*3/uL — ABNORMAL HIGH (ref 1.7–7.7)
Neutrophils Relative %: 80 %
Platelets: 330 10*3/uL (ref 150–400)
RBC: 4.64 MIL/uL (ref 4.22–5.81)
RDW: 11.9 % (ref 11.5–15.5)
WBC: 11.1 10*3/uL — ABNORMAL HIGH (ref 4.0–10.5)
nRBC: 0 % (ref 0.0–0.2)

## 2024-01-01 LAB — TROPONIN I (HIGH SENSITIVITY): Troponin I (High Sensitivity): 5 ng/L (ref <18)

## 2024-01-01 LAB — LIPASE, BLOOD: Lipase: 40 U/L (ref 11–51)

## 2024-01-01 SURGERY — ESOPHAGOGASTRODUODENOSCOPY (EGD) WITH PROPOFOL
Anesthesia: General

## 2024-01-01 MED ORDER — SODIUM CHLORIDE 0.9 % IV SOLN
25.0000 mg | Freq: Once | INTRAVENOUS | Status: AC
  Administered 2024-01-01: 25 mg via INTRAVENOUS
  Filled 2024-01-01: qty 1

## 2024-01-01 MED ORDER — OMEPRAZOLE 40 MG PO CPDR: 40.0000 mg | DELAYED_RELEASE_CAPSULE | Freq: Every day | ORAL | 1 refills | Status: DC

## 2024-01-01 MED ORDER — MIDAZOLAM HCL 2 MG/2ML IJ SOLN
INTRAMUSCULAR | Status: DC | PRN
  Administered 2024-01-01: 2 mg via INTRAVENOUS

## 2024-01-01 MED ORDER — IOHEXOL 350 MG/ML SOLN
75.0000 mL | Freq: Once | INTRAVENOUS | Status: AC | PRN
  Administered 2024-01-01: 75 mL via INTRAVENOUS

## 2024-01-01 MED ORDER — LIDOCAINE HCL (CARDIAC) PF 100 MG/5ML IV SOSY
PREFILLED_SYRINGE | INTRAVENOUS | Status: DC | PRN
  Administered 2024-01-01: 100 mg via INTRAVENOUS

## 2024-01-01 MED ORDER — SUCRALFATE 1 GM/10ML PO SUSP
1.0000 g | Freq: Once | ORAL | Status: AC
  Administered 2024-01-01: 1 g via ORAL
  Filled 2024-01-01: qty 10

## 2024-01-01 MED ORDER — MIDAZOLAM HCL 2 MG/2ML IJ SOLN
INTRAMUSCULAR | Status: AC
  Filled 2024-01-01: qty 2

## 2024-01-01 MED ORDER — LIDOCAINE HCL (PF) 2 % IJ SOLN
INTRAMUSCULAR | Status: AC
  Filled 2024-01-01: qty 5

## 2024-01-01 MED ORDER — SODIUM CHLORIDE 0.9 % IV SOLN: INTRAVENOUS | Status: DC

## 2024-01-01 MED ORDER — DEXMEDETOMIDINE HCL IN NACL 80 MCG/20ML IV SOLN
INTRAVENOUS | Status: DC | PRN
  Administered 2024-01-01: 20 ug via INTRAVENOUS

## 2024-01-01 MED ORDER — PANTOPRAZOLE SODIUM 40 MG IV SOLR
40.0000 mg | Freq: Once | INTRAVENOUS | Status: AC
  Administered 2024-01-01: 40 mg via INTRAVENOUS
  Filled 2024-01-01: qty 10

## 2024-01-01 MED ORDER — LACTATED RINGERS IV SOLN: INTRAVENOUS | Status: DC

## 2024-01-01 MED ORDER — ONDANSETRON HCL 4 MG/2ML IJ SOLN
4.0000 mg | Freq: Once | INTRAMUSCULAR | Status: AC
  Administered 2024-01-01: 4 mg via INTRAVENOUS
  Filled 2024-01-01: qty 2

## 2024-01-01 MED ORDER — LIDOCAINE VISCOUS HCL 2 % MT SOLN
15.0000 mL | Freq: Once | OROMUCOSAL | Status: AC
  Administered 2024-01-01: 15 mL via OROMUCOSAL
  Filled 2024-01-01: qty 15

## 2024-01-01 MED ORDER — PROPOFOL 10 MG/ML IV BOLUS
INTRAVENOUS | Status: DC | PRN
  Administered 2024-01-01: 50 mg via INTRAVENOUS
  Administered 2024-01-01: 100 mg via INTRAVENOUS
  Administered 2024-01-01 (×3): 50 mg via INTRAVENOUS

## 2024-01-01 MED ORDER — FENTANYL CITRATE PF 50 MCG/ML IJ SOSY
50.0000 ug | PREFILLED_SYRINGE | Freq: Once | INTRAMUSCULAR | Status: AC
  Administered 2024-01-01: 50 ug via INTRAVENOUS
  Filled 2024-01-01: qty 1

## 2024-01-01 NOTE — Op Note (Addendum)
 [facility] Patient Name: Mitchell Quinn Procedure Date: 01/01/2024 2:12 PM MRN: [id number] Date of Birth: [birth year] Attending MD: Deatrice Dine , MD, [id number] CSN: [id number] Age: 33 Admit Type: Outpatient Procedure:                Upper GI endoscopy Indications:              Epigastric abdominal pain, Dysphagia Providers:                Deatrice Dine, MD, Harlene Lips, Nidia Oak Referring MD:              Medicines:                Monitored Anesthesia Care Complications:            No immediate complications. Estimated Blood Loss:     Estimated blood loss was minimal. Procedure:                Pre-Anesthesia Assessment:                           - Prior to the procedure, a History and Physical                            was performed, and patient medications and                            allergies were reviewed. The patient's tolerance of                            previous anesthesia was also reviewed. The risks                            and benefits of the procedure and the sedation                            options and risks were discussed with the patient.                            All questions were answered, and informed consent                            was obtained. Prior Anticoagulants: The patient has                            taken no anticoagulant or antiplatelet agents. ASA                            Grade Assessment: II - A patient with mild systemic                            disease. After reviewing the risks and benefits,  the patient was deemed in satisfactory condition to                            undergo the procedure.                           After obtaining informed consent, the endoscope was                            passed under direct vision. Throughout the                            procedure, the patient's blood pressure, pulse, and                            oxygen  saturations were monitored continuously. The                            [id number]-H190 ([id number]) scope was introduced through the                            mouth, and advanced to the third part of duodenum.                            The upper GI endoscopy was accomplished without                            difficulty. The patient tolerated the procedure                            well. Scope In: 2:31:26 PM Scope Out: 2:44:02 PM Total Procedure Duration: 0 hours 12 minutes 36 seconds  Findings:      No endoscopic abnormality was evident in the esophagus to explain the       patient's complaint of dysphagia. It was decided, however, to proceed       with dilation of the entire esophagus. A guidewire was placed and the       scope was withdrawn. Dilation was performed with a Savary dilator with       no resistance at 18 mm. The dilation site was examined following       endoscope reinsertion and showed no change. Biopsies were obtained from       the proximal and distal esophagus with cold forceps for histology of       suspected eosinophilic esophagitis.      Esophagogastric landmarks were identified: the Z-line was found at 40 cm       and the site of hiatal narrowing was found at 40 cm from the incisors.      Mildly erythematous mucosa without bleeding was found in the stomach.       Biopsies were taken with a cold forceps for histology.      Lymphangiectasia was present in the duodenal bulb and in the second       portion of the duodenum. Biopsies for histology were taken with a cold       forceps for evaluation of celiac disease. Impression:               -  No endoscopic esophageal abnormality to explain                            patient's dysphagia. Esophagus dilated. Dilated.                           - Esophagogastric landmarks identified.                           - Erythematous mucosa in the stomach. Biopsied.                           - Duodenal mucosal lymphangiectasia.                            - Biopsies were taken with a cold forceps for                            evaluation of eosinophilic esophagitis. Moderate Sedation:      Per Anesthesia Care Recommendation:           - Patient has a contact number available for                            emergencies. The signs and symptoms of potential                            delayed complications were discussed with the                            patient. Return to normal activities tomorrow.                            Written discharge instructions were provided to the                            patient.                           - Resume previous diet.                           - Continue present medications.                           - Await pathology results.                           -Obtain esophagram                           -Will refer for HRM+24 hour pH impedance (off PPI)                            at [facility]                           -Given family history  of achalasia we may be                            dealing with motility disorder                           -Given food seen in stomach on endoscopy will                            obtain GES ( Gastric emptying study) to evaluate                            for idiopathic gastroparesis                           -Patient and family memebers giving history of what                            appears to be plueritic chest pain , 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 , 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 for                            PE ( CT PE study); spoke with Dr Towana in ED to                            obtain this Procedure Code(s):        --- Professional ---                           [phone], Esophagogastroduodenoscopy, flexible,                            transoral; with insertion of guide wire followed by                            passage of dilator(s) through esophagus over guide                            wire                            [id number], 59, Esophagogastroduodenoscopy, flexible,                            transoral; with biopsy, single or multiple Diagnosis Code(s):        --- Professional ---                           R13.10, Dysphagia, unspecified                           K31.89, Other diseases of stomach and duodenum  I89.0, Lymphedema, not elsewhere classified                           R10.13, Epigastric pain CPT copyright 2022 American [employer] Association. All rights reserved. The codes documented in this report are preliminary and upon coder review may  be revised to meet current compliance requirements. Deatrice Dine, MD Deatrice Dine, MD 01/01/2024 2:50:50 PM This report has been signed electronically. Number of Addenda: 0

## 2024-01-01 NOTE — Transfer of Care (Signed)
 Immediate Anesthesia Transfer of Care Note  Patient: Mitchell Quinn  Procedure(s) Performed: ESOPHAGOGASTRODUODENOSCOPY (EGD) WITH PROPOFOL  ESOPHAGEAL DILATION BIOPSY  Patient Location: PACU  Anesthesia Type:General  Level of Consciousness: awake, alert , and drowsy  Airway & Oxygen Therapy: Patient Spontanous Breathing  Post-op Assessment: Report given to RN, Post -op Vital signs reviewed and stable, and Patient moving all extremities X 4  Post vital signs: Reviewed and stable  Last Vitals:  Vitals Value Taken Time  BP 93/64 01/01/24 1448  Temp 97.4 01/01/24 1451  Pulse 80 01/01/24 1451  Resp 19 01/01/24 1451  SpO2 98 % 01/01/24 1451  Vitals shown include unfiled device data.  Last Pain:  Vitals:   01/01/24 1426  TempSrc:   PainSc: 1          Complications: No notable events documented.

## 2024-01-01 NOTE — Anesthesia Preprocedure Evaluation (Signed)
 Anesthesia Evaluation  Patient identified by MRN, date of birth, ID band Patient awake    Reviewed: Allergy & Precautions, H&P , NPO status , Patient's Chart, lab work & pertinent test results, reviewed documented beta blocker date and time   Airway Mallampati: II  TM Distance: >3 FB Neck ROM: full    Dental no notable dental hx.    Pulmonary neg pulmonary ROS, pneumonia, former smoker   Pulmonary exam normal breath sounds clear to auscultation       Cardiovascular Exercise Tolerance: Good hypertension, negative cardio ROS  Rhythm:regular Rate:Normal     Neuro/Psych negative neurological ROS  negative psych ROS   GI/Hepatic negative GI ROS, Neg liver ROS,GERD  ,,  Endo/Other  negative endocrine ROS    Renal/GU negative Renal ROS  negative genitourinary   Musculoskeletal   Abdominal   Peds  Hematology negative hematology ROS (+)   Anesthesia Other Findings   Reproductive/Obstetrics negative OB ROS                             Anesthesia Physical Anesthesia Plan  ASA: 2 and emergent  Anesthesia Plan: General   Post-op Pain Management:    Induction:   PONV Risk Score and Plan: Propofol  infusion  Airway Management Planned:   Additional Equipment:   Intra-op Plan:   Post-operative Plan:   Informed Consent: I have reviewed the patients History and Physical, chart, labs and discussed the procedure including the risks, benefits and alternatives for the proposed anesthesia with the patient or authorized representative who has indicated his/her understanding and acceptance.     Dental Advisory Given  Plan Discussed with: CRNA  Anesthesia Plan Comments:        Anesthesia Quick Evaluation

## 2024-01-01 NOTE — ED Notes (Signed)
 Blanket given to patient. Patient requesting pain medication, MD notified.

## 2024-01-01 NOTE — ED Provider Notes (Signed)
 [place] EMERGENCY DEPARTMENT AT [facility] Provider Note   CSN: [id number] Arrival date & time: 01/01/24  9293     History  Chief Complaint  Patient presents with   Abdominal Pain    Mitchell Quinn is a 33 y.o. male.  He is here with complaint of worsening pain in his chest and upper abdomen.  The symptoms have been going on for about a year but worse over the last 3 months.  He said he ran out of his viscous lidocaine  a few days ago and the symptoms have worsened.  He supposed to get an endoscopy in a few weeks. has nausea and vomiting.  Has been seen multiple times in the ED for same.  The history is provided by the patient.  Abdominal Pain Pain location:  Epigastric Pain quality: burning   Pain radiates to:  Chest Pain severity:  Severe Onset quality:  Gradual Duration:  3 days Timing:  Constant Progression:  Unchanged Chronicity:  Recurrent Context: not trauma   Relieved by:  Nothing Worsened by:  Eating Ineffective treatments:  Liquids Associated symptoms: chest pain, cough, nausea, shortness of breath and vomiting   Associated symptoms: no dysuria, no fever and no hematemesis        Home Medications Prior to Admission medications   Medication Sig Start Date End Date Taking? Authorizing Provider  famotidine  (PEPCID ) 20 MG tablet Take 1 tablet (20 mg total) by mouth 2 (two) times daily as needed for heartburn or indigestion. 11/16/23   Kommor, Madison, MD  lidocaine  (XYLOCAINE ) 2 % solution Use as directed 15 mLs in the mouth or throat every 6 (six) hours as needed (chest pain/gerd). 12/05/23   Ezzard Sonny RAMAN, PA-C  ondansetron  (ZOFRAN -ODT) 4 MG disintegrating tablet Take 1 tablet (4 mg total) by mouth every 8 (eight) hours as needed for nausea or vomiting. 12/21/23   Raford Lenis, MD  pantoprazole  (PROTONIX ) 40 MG tablet Take 1 tablet (40 mg total) by mouth daily. 11/16/23   Kommor, Lum, MD      Allergies    Patient has no known allergies.     Review of Systems   Review of Systems  Constitutional:  Negative for fever.  Respiratory:  Positive for cough and shortness of breath.   Cardiovascular:  Positive for chest pain.  Gastrointestinal:  Positive for abdominal pain, nausea and vomiting. Negative for hematemesis.  Genitourinary:  Negative for dysuria.    Physical Exam Updated Vital Signs BP (!) 161/108   Pulse 62   Temp 97.7 F (36.5 C) (Oral)   Resp 14   Ht 5' 11 (1.803 m)   Wt 106.6 kg   SpO2 99%   BMI 32.78 kg/m  Physical Exam Vitals and nursing note reviewed.  Constitutional:      General: He is not in acute distress.    Appearance: He is well-developed.  HENT:     Head: Normocephalic and atraumatic.  Eyes:     Conjunctiva/sclera: Conjunctivae normal.  Cardiovascular:     Rate and Rhythm: Normal rate and regular rhythm.     Heart sounds: No murmur heard. Pulmonary:     Effort: Pulmonary effort is normal. No respiratory distress.     Breath sounds: Normal breath sounds.  Abdominal:     Palpations: Abdomen is soft.     Tenderness: There is abdominal tenderness in the epigastric area.  Musculoskeletal:        General: No swelling.     Cervical back: Neck  supple.  Skin:    General: Skin is warm and dry.     Capillary Refill: Capillary refill takes less than 2 seconds.  Neurological:     General: No focal deficit present.     Mental Status: He is alert.     ED Results / Procedures / Treatments   Labs (all labs ordered are listed, but only abnormal results are displayed) Labs Reviewed  COMPREHENSIVE METABOLIC PANEL - Abnormal; Notable for the following components:      Result Value   Glucose, Bld 112 (*)    All other components within normal limits  CBC WITH DIFFERENTIAL/PLATELET - Abnormal; Notable for the following components:   WBC 11.1 (*)    Neutro Abs 8.9 (*)    Abs Immature Granulocytes 0.09 (*)    All other components within normal limits  LIPASE, BLOOD  SURGICAL PATHOLOGY   TROPONIN I (HIGH SENSITIVITY)    EKG EKG Interpretation Date/Time:  Thursday January 01 2024 07:22:49 EST Ventricular Rate:  57 PR Interval:  205 QRS Duration:  108 QT Interval:  421 QTC Calculation: 410 R Axis:   49  Text Interpretation: Unknown rhythm, irregular rate Borderline prolonged PR interval Abnormal inferior Q waves ST elev, probable normal early repol pattern No significant change since prior 12/24 Confirmed by Towana Sharper [phone]) on 01/01/2024 7:29:10 AM  Radiology CT Angio Chest PE W/Cm &/Or Wo Cm Result Date: 01/01/2024 CLINICAL DATA:  Pulmonary embolism (PE) suspected, high prob EXAM: CT ANGIOGRAPHY CHEST WITH CONTRAST TECHNIQUE: Multidetector CT imaging of the chest was performed using the standard protocol during bolus administration of intravenous contrast. Multiplanar CT image reconstructions and MIPs were obtained to evaluate the vascular anatomy. RADIATION DOSE REDUCTION: This exam was performed according to the departmental dose-optimization program which includes automated exposure control, adjustment of the mA and/or kV according to patient size and/or use of iterative reconstruction technique. CONTRAST:  75mL OMNIPAQUE  IOHEXOL  350 MG/ML SOLN COMPARISON:  Radiograph 12/20/2023. Chest CTA 11/16/2023, 01/04/2023 FINDINGS: Cardiovascular: There are no filling defects within the pulmonary arteries to suggest pulmonary embolus. No evidence of acute aortic finding. Heart is upper normal in size. No pericardial effusion. Mediastinum/Nodes: No mediastinal or hilar adenopathy. Unremarkable appearance of the esophagus and thyroid gland. Lungs/Pleura: Minor subsegmental atelectasis in the dependent lower lobes. No other focal airspace disease. No pleural effusion. No features of pulmonary edema. Upper Abdomen: No acute findings. Musculoskeletal: There are no acute or suspicious osseous abnormalities. Review of the MIP images confirms the above findings. IMPRESSION: 1. No pulmonary  embolus. 2. Minor subsegmental atelectasis in the dependent lower lobes. Electronically Signed   By: Andrea Gasman M.D.   On: 01/01/2024 20:01    Procedures Procedures    Medications Ordered in ED Medications  fentaNYL  (SUBLIMAZE ) injection 50 mcg (has no administration in time range)  ondansetron  (ZOFRAN ) injection 4 mg (has no administration in time range)  pantoprazole  (PROTONIX ) injection 40 mg (has no administration in time range)  lidocaine  (XYLOCAINE ) 2 % viscous mouth solution 15 mL (has no administration in time range)    ED Course/ Medical Decision Making/ A&P Clinical Course as of 01/01/24 2101  Thu Jan 01, 2024  1111 Discussed with GI Dr. Cinderella.  He will see if he can get him into endoscopy today.  Patient updated on plan. [MB]    Clinical Course User Index [MB] Towana Sharper BROCKS, MD  Medical Decision Making Amount and/or Complexity of Data Reviewed Labs: ordered. Radiology: ordered.  Risk Prescription drug management.   This patient complains of pain in his upper abdomen and chest, vomiting; this involves an extensive number of treatment Options and is a complaint that carries with it a high risk of complications and morbidity. The differential includes gastritis, peptic ulcer disease, esophagitis, pneumonia, ACS  I ordered, reviewed and interpreted labs, which included CBC with mildly elevated white count, chemistries LFTs normal, troponin flat I ordered medication GI medications pain medicine and reviewed PMP when indicated. Additional history obtained from patient's mother Previous records obtained and reviewed in epic, multiple ED visits for similar presentation I consulted GI Dr. Cinderella and discussed lab and imaging findings and discussed disposition.  Cardiac monitoring reviewed, sinus rhythm Social determinants considered, no significant barriers Critical Interventions: None  After the interventions stated above, I  reevaluated the patient and found patient still to be retching unable to tolerate p.o. Admission and further testing considered, GI is willing to take the patient to endoscopy for further evaluation.  Patient in agreement with plan for GI evaluation         Final Clinical Impression(s) / ED Diagnoses Final diagnoses:  Esophageal dysphagia  Projectile vomiting with nausea    Rx / DC Orders ED Discharge Orders     None         Towana Ozell BROCKS, MD 01/01/24 2103

## 2024-01-01 NOTE — Telephone Encounter (Signed)
 Yes please , thank you for picking that up!

## 2024-01-01 NOTE — ED Triage Notes (Signed)
 Pt c/o epigastric pain and vomiting that started a few hours ago. Pt reports hx of same and is scheduled to have an EGD soon. He ran out of his stomach medication 2 days ago.

## 2024-01-01 NOTE — H&P (View-Only) (Signed)
 Gastroenterology Consult   Referring Provider: No ref. provider found Primary Care Physician:  Atilano Deward ORN, MD Primary Gastroenterologist:  Dr. Shaaron   Patient ID: Dorn Mitchell Quinn; [id number]; 11/04/1991   Admit date: 01/01/2024  LOS: 0 days   Date of Consultation: 01/01/2024  Reason for Consultation:  epigastric pain/chest pain, nausea/vomiting     History of Present Illness   Mitchell Quinn is a 33 y.o. male with history of recurrent ED visits for epigastric pain/chest pain.  He was seen in our office back on December 6 for the same, scheduled for EGD January 17.  Mother has a history of achalasia.  Patient has had multiple ED visits since November.  This is his fourth ED visit in the past 3 weeks.  He states today his symptoms have been most severe. He has been on pantoprazole  40 mg twice daily but does not feel like he has had any relief or improvement in his symptoms.  He ran out of medication 2 days ago.  He also has used viscous lidocaine  which has helped in the past with his chest pain but no longer getting relief.  Received a GI cocktail in the ED today without relief.  Has significant nausea/vomiting, with no antiemetics at home.  Using famotidine  20 mg twice daily as needed.    Symptoms consist of typical reflux type symptoms with certain foods, worse in a reclined position or if not waiting long enough between meals and laying down.  However he also develops pain in the chest which radiates into his back also associated with epigastric pain.  Often has vomiting which has been more frequent.  At times he does have hematemesis.  Sometimes has diaphoresis associated with symptoms.  When I saw him back 3 weeks ago, he was having vomiting about 3 times per week.  Sometimes feels like food gets stuck in the chest but usually happens after meals not during the meal.  Chronically has alternating constipation and diarrhea.  Denies any melena or rectal bleeding.  Patient reports an  additional 13 pound weight loss in the past 1-1/2 weeks.  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  Random colon biopsies performed, benign with hyperplastic lymphoid aggregates, no active colitis. -Normal esophagus -Mild patchy erythema in the antrum, chronic gastritis with no H. pylori -Normal duodenal bulb, ampulla, second portion of duodenum status post biopsies, active duodenitis.  Patchy mostly mild active duodenitis associated with preservation of the villous architecture and no granulomata.  Differential diagnosis includes infection versus inflammatory bowel disease.  No evidence of eosinophilic duodenitis or celiac.   Prior to Admission medications   Medication Sig Start Date End Date Taking? Authorizing Provider  famotidine  (PEPCID ) 20 MG tablet Take 1 tablet (20 mg total) by mouth 2 (two) times daily as needed for heartburn or indigestion. 11/16/23   Kommor, Madison, MD  lidocaine  (XYLOCAINE ) 2 % solution Use as directed 15 mLs in the mouth or throat every 6 (six) hours as needed (chest pain/gerd). 12/05/23   Ezzard Sonny RAMAN, PA-C  ondansetron  (ZOFRAN -ODT) 4 MG disintegrating tablet Take 1 tablet (4 mg total) by mouth every 8 (eight) hours as needed for nausea or vomiting. 12/21/23   Raford Lenis, MD  pantoprazole  (PROTONIX ) 40 MG tablet Take 1 tablet (40 mg total) by mouth daily. 11/16/23   Kommor, Lum, MD  No current facility-administered medications for this encounter.   Current Outpatient Medications  Medication Sig Dispense Refill   famotidine  (PEPCID ) 20 MG tablet Take 1 tablet (20 mg total) by mouth 2 (two) times daily as needed for heartburn or indigestion. 30 tablet 0   lidocaine  (XYLOCAINE ) 2 % solution Use as directed 15 mLs in the mouth or throat every 6 (six) hours as needed (chest  pain/gerd). 100 mL 1   ondansetron  (ZOFRAN -ODT) 4 MG disintegrating tablet Take 1 tablet (4 mg total) by mouth every 8 (eight) hours as needed for nausea or vomiting. 20 tablet 2   pantoprazole  (PROTONIX ) 40 MG tablet Take 1 tablet (40 mg total) by mouth daily. 30 tablet 0    Allergies as of 01/01/2024   (No Known Allergies)    Past Medical History:  Diagnosis Date   Gastroesophageal reflux disease, unspecified whether esophagitis present    Hypertension    IBS (irritable bowel syndrome)    Pneumonia     Past Surgical History:  Procedure Laterality Date   ORIF WRIST FRACTURE      Family History  Problem Relation Age of Onset   Healthy Mother    Achalasia Mother    Healthy Father    Other Cousin        lower intestines removed, not working anymore   Colon cancer Neg Hx     Social History   Socioeconomic History   Marital status: Married    Spouse name: Not on file   Number of children: Not on file   Years of education: Not on file   Highest education level: Not on file  Occupational History   Not on file  Tobacco Use   Smoking status: Former    Types: Cigarettes    Passive exposure: Never   Smokeless tobacco: Former    Types: Snuff  Vaping Use   Vaping status: Every Day  Substance and Sexual Activity   Alcohol use: No   Drug use: Never   Sexual activity: Yes  Other Topics Concern   Not on file  Social History Narrative   Not on file   Social Drivers of Health   Financial Resource Strain: Not on file  Food Insecurity: Not on file  Transportation Needs: Not on file  Physical Activity: Not on file  Stress: Not on file  Social Connections: Not on file  Intimate Partner Violence: Not on file     Review of System:   General: Negative for  fever, chills, fatigue, weakness.  See HPI Eyes: Negative for vision changes.  ENT: Negative for hoarseness,   nasal congestion. CV: Negative for  angina, palpitations, dyspnea on exertion, peripheral edema.  See  HPI Respiratory: Negative for dyspnea at rest, dyspnea on exertion, cough, sputum, wheezing.  GI: See history of present illness. GU:  Negative for dysuria, hematuria, urinary incontinence, urinary frequency, nocturnal urination.  MS: Negative for joint pain, low back pain.  Derm: Negative for rash or itching.  Neuro: Negative for weakness, abnormal sensation, seizure, frequent headaches, memory loss, confusion.  Psych: Negative for anxiety, depression, suicidal ideation, hallucinations.  Endo: Negative for unusual weight change.  Heme: Negative for bruising or bleeding. Allergy: Negative for rash or hives.      Physical Examination:   Vital signs in last 24 hours: Temp:  [97.7 F (36.5 C)-97.9 F (36.6 C)] 97.9 F (36.6 C) (01/02 1059) Pulse Rate:  [48-67] 53 (01/02 1100) Resp:  [11-23] 17 (01/02 1100) BP: (120-161)/(73-108) 131/82 (01/02  1100) SpO2:  [92 %-100 %] 98 % (01/02 1100) Weight:  [106.6 kg] 106.6 kg (01/02 0722)    General: Appears to not feel well.  No acute distress.  Mom at bedside.   Head: Normocephalic, atraumatic.   Eyes: Conjunctiva pink, no icterus. Mouth: Oropharyngeal mucosa moist and pink , no lesions erythema or exudate. Neck: Supple without thyromegaly, masses, or lymphadenopathy.  Lungs: Clear to auscultation bilaterally.  Heart: Regular rate and rhythm, no murmurs rubs or gallops.  Abdomen: Bowel sounds are normal,   nondistended, no hepatosplenomegaly or masses, no abdominal bruits or hernia , no rebound or guarding.  Mild epigastric tenderness Rectal: Not performed Extremities: No lower extremity edema, clubbing, deformity.  Neuro: Alert and oriented x 4 , grossly normal neurologically.  Skin: Warm and dry, no rash or jaundice.   Psych: Alert and cooperative, normal mood and affect.        Intake/Output from previous day: No intake/output data recorded. Intake/Output this shift: No intake/output data recorded.  Lab Results:   CBC Recent  Labs    01/01/24 0805  WBC 11.1*  HGB 14.2  HCT 41.3  MCV 89.0  PLT 330   BMET Recent Labs    01/01/24 0805  NA 138  K 3.8  CL 102  CO2 27  GLUCOSE 112*  BUN 11  CREATININE 0.97  CALCIUM 9.6   LFT Recent Labs    01/01/24 0805  BILITOT 0.7  ALKPHOS 61  AST 19  ALT 17  PROT 7.6  ALBUMIN 4.7    Lipase Recent Labs    01/01/24 0805  LIPASE 40    PT/INR No results for input(s): LABPROT, INR in the last 72 hours.   Hepatitis Panel No results for input(s): HEPBSAG, HCVAB, HEPAIGM, HEPBIGM in the last 72 hours.   Imaging Studies:   DG Chest 2 View Result Date: 12/20/2023 CLINICAL DATA:  Chest pain and shortness of breath. Central chest pain on and off for 2 months. Burning and tightness. Vomiting starting 45 minutes ago. EXAM: CHEST - 2 VIEW COMPARISON:  12/07/2023 FINDINGS: The heart size and mediastinal contours are within normal limits. Both lungs are clear. The visualized skeletal structures are unremarkable. IMPRESSION: No active cardiopulmonary disease. Electronically Signed   By: Elsie Gravely M.D.   On: 12/20/2023 22:47   DG Chest 2 View Result Date: 12/07/2023 CLINICAL DATA:  Chest pain EXAM: CHEST - 2 VIEW COMPARISON:  09/01/2023 FINDINGS: The heart size and mediastinal contours are within normal limits. Both lungs are clear. The visualized skeletal structures are unremarkable. IMPRESSION: No active cardiopulmonary disease. Electronically Signed   By: Oneil Devonshire M.D.   On: 12/07/2023 02:33  [4 week]  Assessment:   GERD/epigastric pain/chest pain/vomiting/dysphagia: Recurrent symptoms with multiple ED visits.  Symptoms may be secondary to reflux with complicating features such as stricture, eosinophilic esophagitis, esophageal motility disorder, less likely biliary etiology.  No relief with PPI twice daily.  Recommend expedited EGD at this time.  Plan:   EGD with possible esophageal dilatation today. ASA 2.  I have discussed the risks,  alternatives, benefits with regards to but not limited to the risk of reaction to medication, bleeding, infection, perforation and the patient is agreeable to proceed. Written consent to be obtained.    LOS: 0 days   We would like to thank you for the opportunity to participate in the care of [employer].  Sonny RAMAN. Ezzard RIGGERS [facility] [phone] 1/2/202512:08 PM

## 2024-01-01 NOTE — Telephone Encounter (Signed)
 Would you like me to schedule the gastric emptying also?

## 2024-01-01 NOTE — ED Provider Notes (Addendum)
 [place] EMERGENCY DEPARTMENT AT [facility] Provider Note   CSN: [id number] Arrival date & time: 01/01/24  9293     History  Chief Complaint  Patient presents with   Abdominal Pain    Mitchell Quinn is a 33 y.o. male.  Patient seen earlier today with epigastric abdominal pain and vomiting and was consulted to gastroenterology and they did an upper endoscopy.  The workup that was negative.  Patient has been struggling to try to get any answers for this pain.  Dr. Cinderella recommended considering doing a CT angio again to rule out pulmonary embolus.  Patient was not particularly hypoxic.  But his pain is pleuritic in nature.  So he was brought back to the emergency department.  Patient still little groggy from the conscious sedation for the procedure.  But have ordered CT angio.  Patient back in November had a negative CT angio.  If negative this time Dr. Cinderella will follow patient up and is ordered some additional specialized studies.  This follow-up would be as an outpatient.       Home Medications Prior to Admission medications   Medication Sig Start Date End Date Taking? Authorizing Provider  omeprazole  (PRILOSEC) 40 MG capsule Take 1 capsule (40 mg total) by mouth daily. 01/01/24 01/31/24 Yes Ahmed, Deatrice FALCON, MD  famotidine  (PEPCID ) 20 MG tablet Take 1 tablet (20 mg total) by mouth 2 (two) times daily as needed for heartburn or indigestion. 11/16/23   Kommor, Madison, MD  lidocaine  (XYLOCAINE ) 2 % solution Use as directed 15 mLs in the mouth or throat every 6 (six) hours as needed (chest pain/gerd). 12/05/23   Ezzard Sonny RAMAN, PA-C  ondansetron  (ZOFRAN -ODT) 4 MG disintegrating tablet Take 1 tablet (4 mg total) by mouth every 8 (eight) hours as needed for nausea or vomiting. 12/21/23   Raford Lenis, MD  pantoprazole  (PROTONIX ) 40 MG tablet Take 1 tablet (40 mg total) by mouth daily. 11/16/23   Kommor, Lum, MD      Allergies    Patient has no known allergies.     Review of Systems   Review of Systems  Cardiovascular:  Positive for chest pain.  Gastrointestinal:  Positive for abdominal pain.    Physical Exam Updated Vital Signs BP 110/73   Pulse 63   Temp 98.3 F (36.8 C) (Oral)   Resp 17   Ht 1.803 m (5' 11)   Wt 106.6 kg   SpO2 95%   BMI 32.78 kg/m  Physical Exam Vitals and nursing note reviewed.  Constitutional:      General: He is not in acute distress.    Appearance: Normal appearance. He is well-developed.  HENT:     Head: Normocephalic and atraumatic.  Eyes:     Conjunctiva/sclera: Conjunctivae normal.  Cardiovascular:     Rate and Rhythm: Normal rate and regular rhythm.     Heart sounds: No murmur heard. Pulmonary:     Effort: Pulmonary effort is normal. No respiratory distress.     Breath sounds: Normal breath sounds.  Abdominal:     Palpations: Abdomen is soft.     Tenderness: There is no abdominal tenderness.  Musculoskeletal:        General: No swelling.     Cervical back: Neck supple.  Skin:    General: Skin is warm and dry.     Capillary Refill: Capillary refill takes less than 2 seconds.  Neurological:     Mental Status: He is alert.  Psychiatric:  Mood and Affect: Mood normal.     ED Results / Procedures / Treatments   Labs (all labs ordered are listed, but only abnormal results are displayed) Labs Reviewed  COMPREHENSIVE METABOLIC PANEL - Abnormal; Notable for the following components:      Result Value   Glucose, Bld 112 (*)    All other components within normal limits  CBC WITH DIFFERENTIAL/PLATELET - Abnormal; Notable for the following components:   WBC 11.1 (*)    Neutro Abs 8.9 (*)    Abs Immature Granulocytes 0.09 (*)    All other components within normal limits  LIPASE, BLOOD  SURGICAL PATHOLOGY  TROPONIN I (HIGH SENSITIVITY)    EKG EKG Interpretation Date/Time:  Thursday January 01 2024 07:22:49 EST Ventricular Rate:  57 PR Interval:  205 QRS Duration:  108 QT  Interval:  421 QTC Calculation: 410 R Axis:   49  Text Interpretation: Unknown rhythm, irregular rate Borderline prolonged PR interval Abnormal inferior Q waves ST elev, probable normal early repol pattern No significant change since prior 12/24 Confirmed by Towana Sharper [phone]) on 01/01/2024 7:29:10 AM  Radiology No results found.  Procedures Procedures    Medications Ordered in ED Medications  fentaNYL  (SUBLIMAZE ) injection 50 mcg (50 mcg Intravenous Given 01/01/24 0801)  ondansetron  (ZOFRAN ) injection 4 mg (4 mg Intravenous Given 01/01/24 0801)  pantoprazole  (PROTONIX ) injection 40 mg (40 mg Intravenous Given 01/01/24 0801)  lidocaine  (XYLOCAINE ) 2 % viscous mouth solution 15 mL (15 mLs Mouth/Throat Given 01/01/24 0801)  sucralfate  (CARAFATE ) 1 GM/10ML suspension 1 g (1 g Oral Given 01/01/24 0859)  promethazine  (PHENERGAN ) 25 mg in sodium chloride  0.9 % 50 mL IVPB (0 mg Intravenous Stopped 01/01/24 1239)    ED Course/ Medical Decision Making/ A&P Clinical Course as of 01/01/24 1718  Thu Jan 01, 2024  1111 Discussed with GI Dr. Cinderella.  He will see if he can get him into endoscopy today.  Patient updated on plan. [MB]    Clinical Course User Index [MB] Towana Sharper BROCKS, MD                                 Medical Decision Making Amount and/or Complexity of Data Reviewed Labs: ordered. Radiology: ordered.  Risk Prescription drug management.   Will get CT angio chest.  ET angio chest is negative.  No evidence of any pulmonary embolus no acute findings.  Patient stable for discharge and follow back up with gastroenterology.   Final Clinical Impression(s) / ED Diagnoses Final diagnoses:  Esophageal dysphagia  Projectile vomiting with nausea    Rx / DC Orders ED Discharge Orders          Ordered    omeprazole  (PRILOSEC) 40 MG capsule  Daily        01/01/24 1446    DG ESOPHAGUS W DOUBLE CM (HD)        01/01/24 1447    NM GASTRIC EMPTYING        01/01/24 1525               Geraldene Hamilton, MD 01/01/24 1718    Geraldene Hamilton, MD 01/01/24 2121

## 2024-01-01 NOTE — Telephone Encounter (Signed)
 Ahmed, Deatrice FALCON, MD  Blanca Jenkins LOUVENIA Arthurine, [place], LPN Hi Ann  Can this patient be referred for Esophageal HRM and 24 hour pH impedance ( off PPI)  Diagnosis : Dysphagia  Hi Adhrit Krenz,  Can you please schedule a Esophagram? Dx: Dysphagia .  Thanks,  Muhammad Faizan Ahmed, MD Gastroenterology and Hepatology [facility]

## 2024-01-01 NOTE — ED Notes (Signed)
 Called nurse to room, patient attempting to vomit with nothing coming up. Emesis bag given to patient. Patient states he continues to have chest pain. MD notified.

## 2024-01-01 NOTE — Consult Note (Signed)
 Gastroenterology Consult   Referring Provider: No ref. provider found Primary Care Physician:  Atilano Deward ORN, MD Primary Gastroenterologist:  Dr. Shaaron   Patient ID: Mitchell Quinn; [id number]; 11/04/1991   Admit date: 01/01/2024  LOS: 0 days   Date of Consultation: 01/01/2024  Reason for Consultation:  epigastric pain/chest pain, nausea/vomiting     History of Present Illness   Mitchell Quinn is a 33 y.o. male with history of recurrent ED visits for epigastric pain/chest pain.  He was seen in our office back on December 6 for the same, scheduled for EGD January 17.  Mother has a history of achalasia.  Patient has had multiple ED visits since November.  This is his fourth ED visit in the past 3 weeks.  He states today his symptoms have been most severe. He has been on pantoprazole  40 mg twice daily but does not feel like he has had any relief or improvement in his symptoms.  He ran out of medication 2 days ago.  He also has used viscous lidocaine  which has helped in the past with his chest pain but no longer getting relief.  Received a GI cocktail in the ED today without relief.  Has significant nausea/vomiting, with no antiemetics at home.  Using famotidine  20 mg twice daily as needed.    Symptoms consist of typical reflux type symptoms with certain foods, worse in a reclined position or if not waiting long enough between meals and laying down.  However he also develops pain in the chest which radiates into his back also associated with epigastric pain.  Often has vomiting which has been more frequent.  At times he does have hematemesis.  Sometimes has diaphoresis associated with symptoms.  When I saw him back 3 weeks ago, he was having vomiting about 3 times per week.  Sometimes feels like food gets stuck in the chest but usually happens after meals not during the meal.  Chronically has alternating constipation and diarrhea.  Denies any melena or rectal bleeding.  Patient reports an  additional 13 pound weight loss in the past 1-1/2 weeks.  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  Random colon biopsies performed, benign with hyperplastic lymphoid aggregates, no active colitis. -Normal esophagus -Mild patchy erythema in the antrum, chronic gastritis with no H. pylori -Normal duodenal bulb, ampulla, second portion of duodenum status post biopsies, active duodenitis.  Patchy mostly mild active duodenitis associated with preservation of the villous architecture and no granulomata.  Differential diagnosis includes infection versus inflammatory bowel disease.  No evidence of eosinophilic duodenitis or celiac.   Prior to Admission medications   Medication Sig Start Date End Date Taking? Authorizing Provider  famotidine  (PEPCID ) 20 MG tablet Take 1 tablet (20 mg total) by mouth 2 (two) times daily as needed for heartburn or indigestion. 11/16/23   Kommor, Madison, MD  lidocaine  (XYLOCAINE ) 2 % solution Use as directed 15 mLs in the mouth or throat every 6 (six) hours as needed (chest pain/gerd). 12/05/23   Ezzard Sonny RAMAN, PA-C  ondansetron  (ZOFRAN -ODT) 4 MG disintegrating tablet Take 1 tablet (4 mg total) by mouth every 8 (eight) hours as needed for nausea or vomiting. 12/21/23   Raford Lenis, MD  pantoprazole  (PROTONIX ) 40 MG tablet Take 1 tablet (40 mg total) by mouth daily. 11/16/23   Kommor, Lum, MD  No current facility-administered medications for this encounter.   Current Outpatient Medications  Medication Sig Dispense Refill   famotidine  (PEPCID ) 20 MG tablet Take 1 tablet (20 mg total) by mouth 2 (two) times daily as needed for heartburn or indigestion. 30 tablet 0   lidocaine  (XYLOCAINE ) 2 % solution Use as directed 15 mLs in the mouth or throat every 6 (six) hours as needed (chest  pain/gerd). 100 mL 1   ondansetron  (ZOFRAN -ODT) 4 MG disintegrating tablet Take 1 tablet (4 mg total) by mouth every 8 (eight) hours as needed for nausea or vomiting. 20 tablet 2   pantoprazole  (PROTONIX ) 40 MG tablet Take 1 tablet (40 mg total) by mouth daily. 30 tablet 0    Allergies as of 01/01/2024   (No Known Allergies)    Past Medical History:  Diagnosis Date   Gastroesophageal reflux disease, unspecified whether esophagitis present    Hypertension    IBS (irritable bowel syndrome)    Pneumonia     Past Surgical History:  Procedure Laterality Date   ORIF WRIST FRACTURE      Family History  Problem Relation Age of Onset   Healthy Mother    Achalasia Mother    Healthy Father    Other Cousin        lower intestines removed, not working anymore   Colon cancer Neg Hx     Social History   Socioeconomic History   Marital status: Married    Spouse name: Not on file   Number of children: Not on file   Years of education: Not on file   Highest education level: Not on file  Occupational History   Not on file  Tobacco Use   Smoking status: Former    Types: Cigarettes    Passive exposure: Never   Smokeless tobacco: Former    Types: Snuff  Vaping Use   Vaping status: Every Day  Substance and Sexual Activity   Alcohol use: No   Drug use: Never   Sexual activity: Yes  Other Topics Concern   Not on file  Social History Narrative   Not on file   Social Drivers of Health   Quinn Resource Strain: Not on file  Food Insecurity: Not on file  Transportation Needs: Not on file  Physical Activity: Not on file  Stress: Not on file  Social Connections: Not on file  Intimate Partner Violence: Not on file     Review of System:   General: Negative for  fever, chills, fatigue, weakness.  See HPI Eyes: Negative for vision changes.  ENT: Negative for hoarseness,   nasal congestion. CV: Negative for  angina, palpitations, dyspnea on exertion, peripheral edema.  See  HPI Respiratory: Negative for dyspnea at rest, dyspnea on exertion, cough, sputum, wheezing.  GI: See history of present illness. GU:  Negative for dysuria, hematuria, urinary incontinence, urinary frequency, nocturnal urination.  MS: Negative for joint pain, low back pain.  Derm: Negative for rash or itching.  Neuro: Negative for weakness, abnormal sensation, seizure, frequent headaches, memory loss, confusion.  Psych: Negative for anxiety, depression, suicidal ideation, hallucinations.  Endo: Negative for unusual weight change.  Heme: Negative for bruising or bleeding. Allergy: Negative for rash or hives.      Physical Examination:   Vital signs in last 24 hours: Temp:  [97.7 F (36.5 C)-97.9 F (36.6 C)] 97.9 F (36.6 C) (01/02 1059) Pulse Rate:  [48-67] 53 (01/02 1100) Resp:  [11-23] 17 (01/02 1100) BP: (120-161)/(73-108) 131/82 (01/02  1100) SpO2:  [92 %-100 %] 98 % (01/02 1100) Weight:  [106.6 kg] 106.6 kg (01/02 0722)    General: Appears to not feel well.  No acute distress.  Mom at bedside.   Head: Normocephalic, atraumatic.   Eyes: Conjunctiva pink, no icterus. Mouth: Oropharyngeal mucosa moist and pink , no lesions erythema or exudate. Neck: Supple without thyromegaly, masses, or lymphadenopathy.  Lungs: Clear to auscultation bilaterally.  Heart: Regular rate and rhythm, no murmurs rubs or gallops.  Abdomen: Bowel sounds are normal,   nondistended, no hepatosplenomegaly or masses, no abdominal bruits or hernia , no rebound or guarding.  Mild epigastric tenderness Rectal: Not performed Extremities: No lower extremity edema, clubbing, deformity.  Neuro: Alert and oriented x 4 , grossly normal neurologically.  Skin: Warm and dry, no rash or jaundice.   Psych: Alert and cooperative, normal mood and affect.        Intake/Output from previous day: No intake/output data recorded. Intake/Output this shift: No intake/output data recorded.  Lab Results:   CBC Recent  Labs    01/01/24 0805  WBC 11.1*  HGB 14.2  HCT 41.3  MCV 89.0  PLT 330   BMET Recent Labs    01/01/24 0805  NA 138  K 3.8  CL 102  CO2 27  GLUCOSE 112*  BUN 11  CREATININE 0.97  CALCIUM 9.6   LFT Recent Labs    01/01/24 0805  BILITOT 0.7  ALKPHOS 61  AST 19  ALT 17  PROT 7.6  ALBUMIN 4.7    Lipase Recent Labs    01/01/24 0805  LIPASE 40    PT/INR No results for input(s): LABPROT, INR in the last 72 hours.   Hepatitis Panel No results for input(s): HEPBSAG, HCVAB, HEPAIGM, HEPBIGM in the last 72 hours.   Imaging Studies:   DG Chest 2 View Result Date: 12/20/2023 CLINICAL DATA:  Chest pain and shortness of breath. Central chest pain on and off for 2 months. Burning and tightness. Vomiting starting 45 minutes ago. EXAM: CHEST - 2 VIEW COMPARISON:  12/07/2023 FINDINGS: The heart size and mediastinal contours are within normal limits. Both lungs are clear. The visualized skeletal structures are unremarkable. IMPRESSION: No active cardiopulmonary disease. Electronically Signed   By: Elsie Gravely M.D.   On: 12/20/2023 22:47   DG Chest 2 View Result Date: 12/07/2023 CLINICAL DATA:  Chest pain EXAM: CHEST - 2 VIEW COMPARISON:  09/01/2023 FINDINGS: The heart size and mediastinal contours are within normal limits. Both lungs are clear. The visualized skeletal structures are unremarkable. IMPRESSION: No active cardiopulmonary disease. Electronically Signed   By: Oneil Devonshire M.D.   On: 12/07/2023 02:33  [4 week]  Assessment:   GERD/epigastric pain/chest pain/vomiting/dysphagia: Recurrent symptoms with multiple ED visits.  Symptoms may be secondary to reflux with complicating features such as stricture, eosinophilic esophagitis, esophageal motility disorder, less likely biliary etiology.  No relief with PPI twice daily.  Recommend expedited EGD at this time.  Plan:   EGD with possible esophageal dilatation today. ASA 2.  I have discussed the risks,  alternatives, benefits with regards to but not limited to the risk of reaction to medication, bleeding, infection, perforation and the patient is agreeable to proceed. Written consent to be obtained.    LOS: 0 days   We would like to thank you for the opportunity to participate in the care of Mitchell Quinn.  Sonny RAMAN. Ezzard RIGGERS [facility] [phone] 1/2/202512:08 PM

## 2024-01-01 NOTE — Progress Notes (Signed)
 Patient underwent EGD with biopsies and dilation under propofol  sedation.  Tolerated the procedure adequately.   FINDINGS:  - No endoscopic esophageal abnormality to explain patient' s dysphagia. Esophagus dilated. Dilated.  - Esophagogastric landmarks identified.  - Erythematous mucosa in the stomach. Biopsied.  - Duodenal mucosal lymphangiectasia.  - Biopsies were taken with a cold forceps for evaluation of eosinophilic esophagitis.   RECOMMENDATIONS - Continue present medications.   - Await pathology results.   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

## 2024-01-01 NOTE — Interval H&P Note (Signed)
 History and Physical Interval Note:  01/01/2024 2:15 PM  Mitchell Quinn  has presented today for surgery, with the diagnosis of epigastric pain, chest pain, nausea/vomiting, acid reflux, dysphagia.  The various methods of treatment have been discussed with the patient and family. After consideration of risks, benefits and other options for treatment, the patient has consented to  Procedure(s): ESOPHAGOGASTRODUODENOSCOPY (EGD) WITH PROPOFOL  (N/A) ESOPHAGEAL DILATION (N/A) as a surgical intervention.  The patient's history has been reviewed, patient examined, no change in status, stable for surgery.  I have reviewed the patient's chart and labs.  Questions were answered to the patient's satisfaction.    We will proceed with EGD with possible biopsy and dilation     I thoroughly discussed with the patient the procedure, including the risks involved. Patient understands what the procedure involves including the benefits and any risks. Patient understands alternatives to the proposed procedure. Risks including (but not limited to) bleeding, tearing of the lining (perforation), rupture of adjacent organs, problems with heart and lung function, infection, and medication reactions. A small percentage of complications may require surgery, hospitalization, repeat endoscopic procedure, and/or transfusion.  Patient understood and agreed.    Deatrice FALCON Hargis Vandyne

## 2024-01-01 NOTE — Discharge Instructions (Signed)
 Follow-up with gastroenterology as planned.  CT chest without any acute findings no evidence of any blood clots.

## 2024-01-01 NOTE — Progress Notes (Signed)
 Per Dr. Tasia Catchings, patient will be transported back to the ER.  He would like a CT completed to rule out blood clots.  Mother updated.  Patient with stable VS upon transport and report given to [place], [place]

## 2024-01-01 NOTE — ED Notes (Signed)
 Patient informed not to drink anything at this time due to NPO order. Patient and mother acknowledged.

## 2024-01-02 ENCOUNTER — Encounter (INDEPENDENT_AMBULATORY_CARE_PROVIDER_SITE_OTHER): Payer: Self-pay

## 2024-01-02 NOTE — Anesthesia Postprocedure Evaluation (Signed)
 Anesthesia Post Note  Patient: Mitchell Quinn  Procedure(s) Performed: ESOPHAGOGASTRODUODENOSCOPY (EGD) WITH PROPOFOL  ESOPHAGEAL DILATION BIOPSY  Patient location during evaluation: Phase II Anesthesia Type: General Level of consciousness: awake Pain management: pain level controlled Vital Signs Assessment: post-procedure vital signs reviewed and stable Respiratory status: spontaneous breathing and respiratory function stable Cardiovascular status: blood pressure returned to baseline and stable Postop Assessment: no headache and no apparent nausea or vomiting Anesthetic complications: no Comments: Late entry   No notable events documented.   Last Vitals:  Vitals:   01/01/24 2100 01/01/24 2123  BP: 100/66 100/66  Pulse: 60 65  Resp: 14 16  Temp:  36.9 C  SpO2: 95% 95%    Last Pain:  Vitals:   01/01/24 2127  TempSrc:   PainSc: 1                  Yvonna [name]

## 2024-01-02 NOTE — Telephone Encounter (Signed)
 Referral sent, they will contact patient with apt

## 2024-01-02 NOTE — Telephone Encounter (Signed)
 Esophagram scheduled for 01/09/24 at 9:00am;pt to arrive at 8:45am.  GES scheduled for 01/08/24 at 8:00am.No stomach meds morning of, NPO after midnight, he will be at [facility] up to 4 hours.   Left message to return call. Will also send my chart message

## 2024-01-05 LAB — SURGICAL PATHOLOGY

## 2024-01-05 NOTE — Telephone Encounter (Signed)
 Pt left voicemail returning call. Returned call to patient. Pt states he saw his appt on Mychart. Explained what procedure were and what time to be there. Pt verbalized understanding.

## 2024-01-08 ENCOUNTER — Encounter (HOSPITAL_COMMUNITY)
Admission: RE | Admit: 2024-01-08 | Discharge: 2024-01-08 | Disposition: A | Discharge status: Home / Self Care | Payer: 59 | Source: Ambulatory Visit | Attending: Gastroenterology | Admitting: Gastroenterology

## 2024-01-08 DIAGNOSIS — R1112 Projectile vomiting: Secondary | ICD-10-CM | POA: Diagnosis present

## 2024-01-08 MED ORDER — TECHNETIUM TC 99M SULFUR COLLOID
2.0000 | Freq: Once | INTRAVENOUS | Status: AC | PRN
  Administered 2024-01-08: 2 via ORAL

## 2024-01-09 ENCOUNTER — Ambulatory Visit (HOSPITAL_COMMUNITY)
Admission: RE | Admit: 2024-01-09 | Discharge: 2024-01-09 | Disposition: A | Discharge status: Home / Self Care | Payer: 59 | Source: Ambulatory Visit | Attending: Gastroenterology | Admitting: Gastroenterology

## 2024-01-09 ENCOUNTER — Encounter (HOSPITAL_COMMUNITY): Payer: Self-pay | Admitting: Gastroenterology

## 2024-01-09 DIAGNOSIS — R1319 Other dysphagia: Secondary | ICD-10-CM | POA: Insufficient documentation

## 2024-01-13 ENCOUNTER — Other Ambulatory Visit (HOSPITAL_COMMUNITY): Payer: 59

## 2024-01-16 ENCOUNTER — Ambulatory Visit (HOSPITAL_COMMUNITY): Admit: 2024-01-16 | Payer: 59 | Admitting: Internal Medicine

## 2024-01-16 ENCOUNTER — Encounter (HOSPITAL_COMMUNITY): Payer: Self-pay

## 2024-01-16 SURGERY — ESOPHAGOGASTRODUODENOSCOPY (EGD) WITH PROPOFOL
Anesthesia: Monitor Anesthesia Care

## 2024-01-19 ENCOUNTER — Telehealth (INDEPENDENT_AMBULATORY_CARE_PROVIDER_SITE_OTHER): Payer: Self-pay | Admitting: Gastroenterology

## 2024-01-19 NOTE — Telephone Encounter (Signed)
Mother came by office after having to go pick up patient in the middle of the night while patient was working out of town.  Mother stated he was in so much pain he is unable to work.  Patient wanted to see Dr Tasia Catchings but advised patients mother he was at the hospital doing procedures. Patients mother stated he doesn't want to go back to [facility] [address]. He wants to see Dr Tasia Catchings only.  Mother stated she would be taking patient to [facility].

## 2024-01-21 ENCOUNTER — Other Ambulatory Visit: Payer: Self-pay

## 2024-01-21 ENCOUNTER — Telehealth: Payer: Self-pay

## 2024-01-21 DIAGNOSIS — R112 Nausea with vomiting, unspecified: Secondary | ICD-10-CM

## 2024-01-21 DIAGNOSIS — R1319 Other dysphagia: Secondary | ICD-10-CM

## 2024-01-21 DIAGNOSIS — R0789 Other chest pain: Secondary | ICD-10-CM

## 2024-01-21 NOTE — Telephone Encounter (Signed)
Referral received from Dr Alm Bustard, MD [facility] for esophageal manometry. Patient contacted. Agrees to appointment 02/18/24 at 2:00 pm arriving at 1:30 pm to the admitting office of [facility]. Written instructions provided through My Chart (pt confirms use) and mailed to the patient.

## 2024-01-22 NOTE — Progress Notes (Deleted)
 Referring Provider: Estanislado Pandy, MD Primary Care Physician:  Estanislado Pandy, MD Primary GI Physician: Dr. Bonnetta Barry chief complaint on file.   HPI:   Mitchell Quinn is a 33 y.o. male presenting today with a history of   Patient was last seen in the office 12/05/2023 for epigastric pain/chest pain radiating to his back at times, GERD, vomiting, and intermittent dysphagia.  Symptoms worse if not sleeping reclined, not waiting long enough after meals before laying down.  He had been started on pantoprazole on November 17 after being seen in the ER but noted no significant improvement.  Famotidine not helpful during flares.  GI cocktail in the ER was helpful however.  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  Mom concerned about achalasia as she had history of the same undergoing Heller myotomy with Dor fundoplication at age 2.  Prior CTA in the ER with no evidence of PE.  His troponins were normal.  No other significant laboratory abnormalities.  Recommended increasing pantoprazole to 40 mg twice daily, continue famotidine 20 mg twice daily, viscous lidocaine, EGD with possible dilation.  Patient was scheduled for EGD on 1/17, but ended up in the ER on 1/2 again for epigastric pain and vomiting. Dr. Tasia Catchings performed EGD which showed a normal esophagus s/p empiric dilation, gastritis biopsied, duodenal mucosal lymphangiectasia biopsied.  Recommended esophagram, manometry and pH impedance off PPI, gastric emptying study, and also recommended CT to rule out PE.  Pathology from EGD showed peptic duodenitis, mild chronic inactive gastritis without H pylori, normal esophageal biopsies.   CT angio chest in the ER was negative for PE.  Gastric emptying study 01/08/2024 with slightly delayed gastric emptying. 86% emptying (90% being normal emptying).   BPE 01/09/2024 with normal exam.  Scheduled for manometry 02/18/2024.   Patient was  admitted to [facility] 1/20 - 1/22 for his same, ongoing upper GI symptoms/chest pain. Suspected visceral hypersensitivity as a cause of his functional chest discomfort. Stated medications most likely to benefit his symptoms included neuromodulator such as citalopram or imipramine. He was started on citalopram 10 mg daily and recommended for follow up at [facility]. He was able to tolerate PO during admission.    Today:      ?esophageal spasm/sensitivity ***  Past Medical History:  Diagnosis Date   Gastroesophageal reflux disease, unspecified whether esophagitis present    Hypertension    IBS (irritable bowel syndrome)    Pneumonia     Past Surgical History:  Procedure Laterality Date   BIOPSY  01/01/2024   Procedure: BIOPSY;  Surgeon: Franky Macho, MD;  [facility]: [facility];  Service: Endoscopy;;   ESOPHAGEAL DILATION N/A 01/01/2024   Procedure: ESOPHAGEAL DILATION;  Surgeon: Franky Macho, MD;  Location: [facility];  Service: Endoscopy;  Laterality: N/A;   ESOPHAGOGASTRODUODENOSCOPY (EGD) WITH PROPOFOL N/A 01/01/2024   Procedure: ESOPHAGOGASTRODUODENOSCOPY (EGD) WITH PROPOFOL;  Surgeon: Franky Macho, MD;  Location: [facility];  Service: Endoscopy;  Laterality: N/A;   ORIF WRIST FRACTURE      Current Outpatient Medications  Medication Sig Dispense Refill   lidocaine (XYLOCAINE) 2 % solution Use as directed 15 mLs in the mouth or throat every 6 (six) hours as needed (chest pain/gerd). 100 mL 1   omeprazole (PRILOSEC) 40 MG capsule Take 1 capsule (40 mg total) by mouth daily. 90 capsule 1   ondansetron (  ZOFRAN-ODT) 4 MG disintegrating tablet Take 1 tablet (4 mg total) by mouth every 8 (eight) hours as needed for nausea or vomiting. 20 tablet 2   pantoprazole (PROTONIX) 40 MG tablet Take 1 tablet (40 mg total) by mouth daily. 30 tablet 0   No current facility-administered medications for this visit.    Allergies as of 01/23/2024   (No Known Allergies)     Family History  Problem Relation Age of Onset   Healthy Mother    Achalasia Mother    Healthy Father    Other Cousin        lower intestines removed, not working anymore   Colon cancer Neg Hx     Social History   Socioeconomic History   Marital status: Married    Spouse name: Not on file   Number of children: Not on file   Years of education: Not on file   Highest education level: Not on file  Occupational History   Not on file  Tobacco Use   Smoking status: Former    Types: Cigarettes    Passive exposure: Never   Smokeless tobacco: Former    Types: Snuff  Vaping Use   Vaping status: Every Day  Substance and Sexual Activity   Alcohol use: No   Drug use: Never   Sexual activity: Yes  Other Topics Concern   Not on file  Social History Narrative   Not on file   Social Drivers of Health   Financial Resource Strain: Not on file  Food Insecurity: Low Risk  (01/19/2024)   Received from [facility]   Hunger Vital Sign    Worried About Running Out of Food in the Last Year: Never true    Ran Out of Food in the Last Year: Never true  Transportation Needs: No Transportation Needs (01/19/2024)   Received from [employer]    In the past 12 months, has lack of reliable transportation kept you from medical appointments, meetings, work or from getting things needed for daily living? : No  Physical Activity: Not on file  Stress: Not on file  Social Connections: Not on file    Review of Systems: Gen: Denies fever, chills, anorexia. Denies fatigue, weakness, weight loss.  CV: Denies chest pain, palpitations, syncope, peripheral edema, and claudication. Resp: Denies dyspnea at rest, cough, wheezing, coughing up blood, and pleurisy. GI: Denies vomiting blood, jaundice, and fecal incontinence.   Denies dysphagia or odynophagia. Derm: Denies rash, itching, dry skin Psych: Denies depression, anxiety, memory loss, confusion. No homicidal or suicidal  ideation.  Heme: Denies bruising, bleeding, and enlarged lymph nodes.  Physical Exam: There were no vitals taken for this visit. General:   Alert and oriented. No distress noted. Pleasant and cooperative.  Head:  Normocephalic and atraumatic. Eyes:  Conjuctiva clear without scleral icterus. Heart:  S1, S2 present without murmurs appreciated. Lungs:  Clear to auscultation bilaterally. No wheezes, rales, or rhonchi. No distress.  Abdomen:  +BS, soft, non-tender and non-distended. No rebound or guarding. No HSM or masses noted. Msk:  Symmetrical without gross deformities. Normal posture. Extremities:  Without edema. Neurologic:  Alert and  oriented x4 Psych:  Normal mood and affect.    Assessment:     Plan:  ***   Ermalinda Memos, PA-C [facility]enterology 01/23/2024

## 2024-01-23 ENCOUNTER — Ambulatory Visit: Payer: 59 | Admitting: Gastroenterology

## 2024-01-26 ENCOUNTER — Telehealth (INDEPENDENT_AMBULATORY_CARE_PROVIDER_SITE_OTHER): Payer: Self-pay | Admitting: *Deleted

## 2024-01-26 ENCOUNTER — Other Ambulatory Visit: Payer: Self-pay

## 2024-01-26 NOTE — Telephone Encounter (Signed)
Patient dropped off forms to fill out for [facility].  Forms on Dr. Tasia Catchings desk to review.   Last OV 01/01/24

## 2024-01-26 NOTE — Addendum Note (Signed)
Addended by: Heber [place] A on: 01/26/2024 02:19 PM   Modules accepted: Orders

## 2024-01-28 NOTE — Telephone Encounter (Signed)
Forms ready for pick up. Patient notified. Copy made to be scanned to chart.

## 2024-02-02 ENCOUNTER — Emergency Department (HOSPITAL_COMMUNITY)
Admission: EM | Admit: 2024-02-02 | Discharge: 2024-02-03 | Disposition: A | Discharge status: Home / Self Care | Payer: 59 | Attending: Emergency Medicine | Admitting: Emergency Medicine

## 2024-02-02 ENCOUNTER — Encounter (HOSPITAL_COMMUNITY): Payer: Self-pay | Admitting: *Deleted

## 2024-02-02 ENCOUNTER — Other Ambulatory Visit: Payer: Self-pay

## 2024-02-02 DIAGNOSIS — E669 Obesity, unspecified: Secondary | ICD-10-CM | POA: Diagnosis not present

## 2024-02-02 DIAGNOSIS — Z6833 Body mass index (BMI) 33.0-33.9, adult: Secondary | ICD-10-CM | POA: Insufficient documentation

## 2024-02-02 DIAGNOSIS — R079 Chest pain, unspecified: Secondary | ICD-10-CM | POA: Diagnosis present

## 2024-02-02 DIAGNOSIS — I10 Essential (primary) hypertension: Secondary | ICD-10-CM | POA: Insufficient documentation

## 2024-02-02 DIAGNOSIS — R0789 Other chest pain: Secondary | ICD-10-CM | POA: Insufficient documentation

## 2024-02-02 NOTE — ED Triage Notes (Signed)
Pt c/o chest pain that started today; pt states he stopped taking his protonix   Pt states he has taken a zofran with no relief

## 2024-02-03 ENCOUNTER — Emergency Department (HOSPITAL_COMMUNITY): Payer: 59

## 2024-02-03 LAB — COMPREHENSIVE METABOLIC PANEL
ALT: 18 U/L (ref 0–44)
AST: 16 U/L (ref 15–41)
Albumin: 4.3 g/dL (ref 3.5–5.0)
Alkaline Phosphatase: 61 U/L (ref 38–126)
Anion gap: 7 (ref 5–15)
BUN: 13 mg/dL (ref 6–20)
CO2: 29 mmol/L (ref 22–32)
Calcium: 9.1 mg/dL (ref 8.9–10.3)
Chloride: 102 mmol/L (ref 98–111)
Creatinine, Ser: 0.97 mg/dL (ref 0.61–1.24)
GFR, Estimated: >60 mL/min (ref >60)
Glucose, Bld: 116 mg/dL — ABNORMAL HIGH (ref 70–99)
Potassium: 4.2 mmol/L (ref 3.5–5.1)
Sodium: 138 mmol/L (ref 135–145)
Total Bilirubin: 0.8 mg/dL (ref 0.0–1.2)
Total Protein: 7 g/dL (ref 6.5–8.1)

## 2024-02-03 LAB — CBC WITH DIFFERENTIAL/PLATELET
Abs Immature Granulocytes: 0.12 10*3/uL — ABNORMAL HIGH (ref 0.00–0.07)
Basophils Absolute: 0.1 10*3/uL (ref 0.0–0.1)
Basophils Relative: 1 %
Eosinophils Absolute: 0.2 10*3/uL (ref 0.0–0.5)
Eosinophils Relative: 1 %
HCT: 39.4 % (ref 39.0–52.0)
Hemoglobin: 13.9 g/dL (ref 13.0–17.0)
Immature Granulocytes: 1 %
Lymphocytes Relative: 11 %
Lymphs Abs: 1.7 10*3/uL (ref 0.7–4.0)
MCH: 31.2 pg (ref 26.0–34.0)
MCHC: 35.3 g/dL (ref 30.0–36.0)
MCV: 88.3 fL (ref 80.0–100.0)
Monocytes Absolute: 0.8 10*3/uL (ref 0.1–1.0)
Monocytes Relative: 5 %
Neutro Abs: 12.3 10*3/uL — ABNORMAL HIGH (ref 1.7–7.7)
Neutrophils Relative %: 81 %
Platelets: 288 10*3/uL (ref 150–400)
RBC: 4.46 MIL/uL (ref 4.22–5.81)
RDW: 12.1 % (ref 11.5–15.5)
WBC: 15.2 10*3/uL — ABNORMAL HIGH (ref 4.0–10.5)
nRBC: 0 % (ref 0.0–0.2)

## 2024-02-03 LAB — LIPASE, BLOOD: Lipase: 40 U/L (ref 11–51)

## 2024-02-03 LAB — TROPONIN I (HIGH SENSITIVITY): Troponin I (High Sensitivity): 5 ng/L (ref <18)

## 2024-02-03 MED ORDER — ALUM & MAG HYDROXIDE-SIMETH 200-200-20 MG/5ML PO SUSP
30.0000 mL | Freq: Once | ORAL | Status: AC
  Administered 2024-02-03: 30 mL via ORAL
  Filled 2024-02-03: qty 30

## 2024-02-03 MED ORDER — DICYCLOMINE HCL 10 MG/5ML PO SOLN
10.0000 mg | Freq: Once | ORAL | Status: DC
Start: 2024-02-03 — End: 2024-02-03
  Filled 2024-02-03: qty 5

## 2024-02-03 MED ORDER — DICYCLOMINE HCL 10 MG PO CAPS
10.0000 mg | ORAL_CAPSULE | Freq: Once | ORAL | Status: AC
Start: 2024-02-03 — End: 2024-02-03
  Administered 2024-02-03: 10 mg via ORAL
  Filled 2024-02-03: qty 1

## 2024-02-03 NOTE — Discharge Instructions (Addendum)
You were seen today for chest discomfort.  Your heart testing is reassuring again.  This is likely related to your known GI issues.  Follow-up closely with your GI doctor regarding recommendations for further management given your upcoming study.

## 2024-02-03 NOTE — ED Provider Notes (Signed)
 [place] EMERGENCY DEPARTMENT AT [facility] Provider Note   CSN: [id number] Arrival date & time: 02/02/24  2256     History  Chief Complaint  Patient presents with   Chest Pain    Mitchell Quinn is a 33 y.o. male.  HPI     This is a 33 year old male who presents with chest discomfort.  Patient reports that it started greater than 12 hours ago.  She states it is mid chest and radiates upward.  He thinks it is consistent with his known GI issues.  However he is due to have a manometry study later this week and has come off of his Protonix .  Reports some nausea and vomiting.  No shortness of breath.  No worsening pain with exertion.  Home Medications Prior to Admission medications   Medication Sig Start Date End Date Taking? Authorizing Provider  lidocaine  (XYLOCAINE ) 2 % solution Use as directed 15 mLs in the mouth or throat every 6 (six) hours as needed (chest pain/gerd). 12/05/23   Ezzard Sonny RAMAN, PA-C  omeprazole  (PRILOSEC) 40 MG capsule Take 1 capsule (40 mg total) by mouth daily. 01/01/24 01/31/24  Ahmed, Muhammad F, MD  ondansetron  (ZOFRAN -ODT) 4 MG disintegrating tablet Take 1 tablet (4 mg total) by mouth every 8 (eight) hours as needed for nausea or vomiting. 12/21/23   Raford Lenis, MD  pantoprazole  (PROTONIX ) 40 MG tablet Take 1 tablet (40 mg total) by mouth daily. 11/16/23   Kommor, Lum, MD      Allergies    Patient has no known allergies.    Review of Systems   Review of Systems  Respiratory:  Negative for shortness of breath.   Cardiovascular:  Positive for chest pain. Negative for leg swelling.  Gastrointestinal:  Positive for nausea and vomiting.  All other systems reviewed and are negative.   Physical Exam Updated Vital Signs BP 132/85   Pulse 64   Temp 98 F (36.7 C) (Oral)   Resp 16   Ht 1.803 m (5' 11)   Wt 108.9 kg   SpO2 100%   BMI 33.47 kg/m  Physical Exam Vitals and nursing note reviewed.  Constitutional:      Appearance:  He is well-developed. He is obese. He is not ill-appearing.  HENT:     Head: Normocephalic and atraumatic.  Eyes:     Pupils: Pupils are equal, round, and reactive to light.  Cardiovascular:     Rate and Rhythm: Normal rate and regular rhythm.     Heart sounds: Normal heart sounds. No murmur heard. Pulmonary:     Effort: Pulmonary effort is normal. No respiratory distress.     Breath sounds: Normal breath sounds. No wheezing.  Abdominal:     General: Bowel sounds are normal.     Palpations: Abdomen is soft.     Tenderness: There is no abdominal tenderness. There is no rebound.  Musculoskeletal:     Cervical back: Neck supple.  Lymphadenopathy:     Cervical: No cervical adenopathy.  Skin:    General: Skin is warm and dry.  Neurological:     General: No focal deficit present.     Mental Status: He is alert and oriented to person, place, and time.     ED Results / Procedures / Treatments   Labs (all labs ordered are listed, but only abnormal results are displayed) Labs Reviewed  CBC WITH DIFFERENTIAL/PLATELET - Abnormal; Notable for the following components:      Result Value  WBC 15.2 (*)    Neutro Abs 12.3 (*)    Abs Immature Granulocytes 0.12 (*)    All other components within normal limits  COMPREHENSIVE METABOLIC PANEL - Abnormal; Notable for the following components:   Glucose, Bld 116 (*)    All other components within normal limits  LIPASE, BLOOD  TROPONIN I (HIGH SENSITIVITY)  TROPONIN I (HIGH SENSITIVITY)    EKG EKG Interpretation Date/Time:  Monday February 02 2024 23:51:51 EST Ventricular Rate:  52 PR Interval:  215 QRS Duration:  108 QT Interval:  439 QTC Calculation: 409 R Axis:   47  Text Interpretation: Sinus rhythm Prolonged PR interval Abnormal inferior Q waves ST elev, probable normal early repol pattern No significant change since last tracing Confirmed by Bari Pfeiffer ([id number]) on 02/03/2024 1:25:17 AM  Radiology DG Chest Portable 1  View Result Date: 02/03/2024 CLINICAL DATA:  Chest pain EXAM: PORTABLE CHEST 1 VIEW COMPARISON:  12/20/2023 FINDINGS: Borderline to mild cardiomegaly. Low lung volumes. No focal opacity, pleural effusion or pneumothorax IMPRESSION: No active disease. Borderline to mild cardiomegaly. Electronically Signed   By: Luke Bun M.D.   On: 02/03/2024 00:32    Procedures Procedures    Medications Ordered in ED Medications  alum & mag hydroxide-simeth (MAALOX/MYLANTA) 200-200-20 MG/5ML suspension 30 mL (30 mLs Oral Given 02/03/24 0012)  dicyclomine  (BENTYL ) capsule 10 mg (10 mg Oral Given 02/03/24 0029)    ED Course/ Medical Decision Making/ A&P                                 Medical Decision Making Amount and/or Complexity of Data Reviewed Labs: ordered. Radiology: ordered.  Risk OTC drugs. Prescription drug management.   This patient presents to the ED for concern of chest pain, this involves an extensive number of treatment options, and is a complaint that carries with it a high risk of complications and morbidity.  I considered the following differential and admission for this acute, potentially life threatening condition.  The differential diagnosis includes ACS, PE, pneumothorax, pneumonia, reflux, known GI issues such as achalasia  MDM:    This is a 33 year old male who presents with chest discomfort.  He is nontoxic.  Vital signs are reassuring.  Reports pain is consistent with his prior GI issues.  He is due to have a manometry study later this week and has come off of his Protonix .  He feels like this is exacerbated his symptoms.  EKG shows no evidence of acute arrhythmia or ischemia.  Basic lab work including CMP, lipase, troponin are all reassuring.  Chest x-ray without pneumothorax or pneumonia.  Low suspicion for ACS.  Patient was given a GI cocktail and Bentyl .  Discussed with him that I agree that this was likely related to his known GI issues.  Recommend close follow-up for  further recommendations for symptom management while awaiting his next study.  (Labs, imaging, consults)  Labs: I Ordered, and personally interpreted labs.  The pertinent results include: CBC, CMP, lipase, troponin  Imaging Studies ordered: I ordered imaging studies including chest x-ray I independently visualized and interpreted imaging. I agree with the radiologist interpretation  Additional history obtained from family at bedside.  External records from outside source obtained and reviewed including prior evaluations  Cardiac Monitoring: The patient was maintained on a cardiac monitor.  If on the cardiac monitor, I personally viewed and interpreted the cardiac monitored which showed an underlying rhythm  of: Sinus  Reevaluation: After the interventions noted above, I reevaluated the patient and found that they have :improved  Social Determinants of Health:  lives independently  Disposition: Discharge with GI follow-up  Co morbidities that complicate the patient evaluation  Past Medical History:  Diagnosis Date   Gastroesophageal reflux disease, unspecified whether esophagitis present    Hypertension    IBS (irritable bowel syndrome)    Pneumonia      Medicines Meds ordered this encounter  Medications   alum & mag hydroxide-simeth (MAALOX/MYLANTA) 200-200-20 MG/5ML suspension 30 mL   DISCONTD: dicyclomine  (BENTYL ) 10 MG/5ML solution 10 mg   dicyclomine  (BENTYL ) capsule 10 mg    I have reviewed the patients home medicines and have made adjustments as needed  Problem List / ED Course: Problem List Items Addressed This Visit   None Visit Diagnoses       Atypical chest pain    -  Primary                   Final Clinical Impression(s) / ED Diagnoses Final diagnoses:  Atypical chest pain    Rx / DC Orders ED Discharge Orders     None         Bari Charmaine FALCON, MD 02/03/24 [phone]

## 2024-02-04 ENCOUNTER — Emergency Department (HOSPITAL_COMMUNITY): Admission: EM | Admit: 2024-02-04 | Discharge: 2024-02-04 | Discharge status: Against Medical Advice | Payer: 59

## 2024-02-04 ENCOUNTER — Encounter (HOSPITAL_COMMUNITY): Payer: Self-pay | Admitting: Gastroenterology

## 2024-02-04 ENCOUNTER — Encounter (HOSPITAL_COMMUNITY)
Admission: RE | Disposition: A | Discharge status: Home / Self Care | Payer: Self-pay | Source: Home / Self Care | Attending: Gastroenterology

## 2024-02-04 ENCOUNTER — Ambulatory Visit (HOSPITAL_COMMUNITY)
Admission: RE | Admit: 2024-02-04 | Discharge: 2024-02-04 | Disposition: A | Discharge status: Home / Self Care | Payer: 59 | Source: Home / Self Care | Attending: Gastroenterology | Admitting: Gastroenterology

## 2024-02-04 DIAGNOSIS — R1310 Dysphagia, unspecified: Secondary | ICD-10-CM | POA: Insufficient documentation

## 2024-02-04 DIAGNOSIS — R112 Nausea with vomiting, unspecified: Secondary | ICD-10-CM

## 2024-02-04 DIAGNOSIS — K219 Gastro-esophageal reflux disease without esophagitis: Secondary | ICD-10-CM

## 2024-02-04 DIAGNOSIS — K449 Diaphragmatic hernia without obstruction or gangrene: Secondary | ICD-10-CM | POA: Diagnosis not present

## 2024-02-04 DIAGNOSIS — R0789 Other chest pain: Secondary | ICD-10-CM | POA: Diagnosis not present

## 2024-02-04 DIAGNOSIS — K8012 Calculus of gallbladder with acute and chronic cholecystitis without obstruction: Secondary | ICD-10-CM | POA: Diagnosis not present

## 2024-02-04 DIAGNOSIS — K810 Acute cholecystitis: Secondary | ICD-10-CM | POA: Diagnosis not present

## 2024-02-04 HISTORY — PX: ESOPHAGEAL MANOMETRY: SHX5429

## 2024-02-04 HISTORY — PX: 24 HOUR PH STUDY: SHX5419

## 2024-02-04 SURGERY — MANOMETRY, ESOPHAGUS
Anesthesia: Choice

## 2024-02-04 MED ORDER — LIDOCAINE VISCOUS HCL 2 % MT SOLN
OROMUCOSAL | Status: AC
  Filled 2024-02-04: qty 15

## 2024-02-04 SURGICAL SUPPLY — 2 items
FACESHIELD LNG OPTICON STERILE (SAFETY) IMPLANT
GLOVE BIO SURGEON STRL SZ8 (GLOVE) ×4 IMPLANT

## 2024-02-04 NOTE — Progress Notes (Signed)
 Esophageal manometry performed per protocol without complications.  Patient tolerated well. pH probe placed per protocol at 39 cm.  Patient tolerated well.  Patient verbalized understanding of probe and diary.  Patient to return tomorrow to have probe removed.

## 2024-02-05 ENCOUNTER — Encounter (HOSPITAL_COMMUNITY): Payer: Self-pay

## 2024-02-05 ENCOUNTER — Emergency Department (HOSPITAL_COMMUNITY)
Admission: EM | Admit: 2024-02-05 | Discharge: 2024-02-05 | Disposition: A | Discharge status: Home / Self Care | Payer: 59 | Source: Home / Self Care | Attending: Emergency Medicine | Admitting: Emergency Medicine

## 2024-02-05 ENCOUNTER — Encounter (HOSPITAL_COMMUNITY): Payer: Self-pay | Admitting: *Deleted

## 2024-02-05 ENCOUNTER — Emergency Department (HOSPITAL_COMMUNITY)
Admission: EM | Admit: 2024-02-05 | Discharge: 2024-02-05 | Discharge status: Against Medical Advice | Payer: 59 | Source: Home / Self Care

## 2024-02-05 ENCOUNTER — Other Ambulatory Visit: Payer: Self-pay

## 2024-02-05 DIAGNOSIS — Z5321 Procedure and treatment not carried out due to patient leaving prior to being seen by health care provider: Secondary | ICD-10-CM | POA: Insufficient documentation

## 2024-02-05 DIAGNOSIS — I10 Essential (primary) hypertension: Secondary | ICD-10-CM | POA: Insufficient documentation

## 2024-02-05 DIAGNOSIS — R0789 Other chest pain: Secondary | ICD-10-CM | POA: Insufficient documentation

## 2024-02-05 DIAGNOSIS — K219 Gastro-esophageal reflux disease without esophagitis: Secondary | ICD-10-CM | POA: Insufficient documentation

## 2024-02-05 DIAGNOSIS — R1013 Epigastric pain: Secondary | ICD-10-CM | POA: Insufficient documentation

## 2024-02-05 DIAGNOSIS — Z87891 Personal history of nicotine dependence: Secondary | ICD-10-CM | POA: Insufficient documentation

## 2024-02-05 MED ORDER — HALOPERIDOL LACTATE 5 MG/ML IJ SOLN
5.0000 mg | Freq: Once | INTRAMUSCULAR | Status: AC
Start: 2024-02-05 — End: 2024-02-05
  Administered 2024-02-05: 5 mg via INTRAMUSCULAR
  Filled 2024-02-05: qty 1

## 2024-02-05 NOTE — Discharge Instructions (Signed)
 You were evaluated in the Emergency Department and after careful evaluation, we did not find any emergent condition requiring admission or further testing in the hospital.  Your exam/testing today is overall reassuring.  Recommend keeping your follow-up later today with the GI specialists.  Please return to the Emergency Department if you experience any worsening of your condition.   Thank you for allowing us  to be a part of your care.

## 2024-02-05 NOTE — ED Triage Notes (Addendum)
 Pov from home. Cc of abdominal / epigastric pain. Says he is seeing a specialist but he is in extreme pain and will not go away.  Feels like tightness  Says he cannot get any xr/ct because of device that he has in his nose

## 2024-02-05 NOTE — ED Provider Notes (Signed)
 [facility] Emergency Department Provider Note MRN:  [id number]  Arrival date & time: 02/05/24     Chief Complaint   Abdominal Pain   History of Present Illness   Mitchell Quinn is a 33 y.o. year-old male with a history of GERD presenting to the ED with chief complaint of abdominal pain.  Patient having epigastric discomfort moderate to severe not going away.  Seems consistent with his prior flares of his chronic GI issue.  Had a esophageal manometry device inserted yesterday, needs to be removed later today.  Denies fever, no nausea vomiting or diarrhea, no chest pain or shortness of breath.  Review of Systems  A thorough review of systems was obtained and all systems are negative except as noted in the HPI and PMH.   Patient's Health History    Past Medical History:  Diagnosis Date   Gastroesophageal reflux disease, unspecified whether esophagitis present    Hypertension    IBS (irritable bowel syndrome)    Pneumonia     Past Surgical History:  Procedure Laterality Date   45 HOUR PH STUDY N/A 02/04/2024   Procedure: 24 HOUR PH STUDY;  Surgeon: Shila Gustav GAILS, MD;  Location: [facility] ENDOS[facility];  Service: Gastroenterology;  Laterality: N/A;   BIOPSY  01/01/2024   Procedure: BIOPSY;  Surgeon: Cinderella Deatrice FALCON, MD;  Location: [facility];  Service: Endoscopy;;   ESOPHAGEAL DILATION N/A 01/01/2024   Procedure: ESOPHAGEAL DILATION;  Surgeon: Cinderella Deatrice FALCON, MD;  Location: [facility];  Service: Endoscopy;  Laterality: N/A;   ESOPHAGEAL MANOMETRY N/A 02/04/2024   Procedure: ESOPHAGEAL MANOMETRY (EM);  Surgeon: Shila Gustav GAILS, MD;  Location: [facility] ENDOS[facility];  Service: Gastroenterology;  Laterality: N/A;   ESOPHAGOGASTRODUODENOSCOPY (EGD) WITH PROPOFOL  N/A 01/01/2024   Procedure: ESOPHAGOGASTRODUODENOSCOPY (EGD) WITH PROPOFOL ;  Surgeon: Cinderella Deatrice FALCON, MD;  Location: [facility];  Service: Endoscopy;  Laterality: N/A;   ORIF WRIST FRACTURE      Family  History  Problem Relation Age of Onset   Healthy Mother    Achalasia Mother    Healthy Father    Other Cousin        lower intestines removed, not working anymore   Colon cancer Neg Hx     Social History   Socioeconomic History   Marital status: Married    Spouse name: Not on file   Number of children: Not on file   Years of education: Not on file   Highest education level: Not on file  Occupational History   Not on file  Tobacco Use   Smoking status: Former    Types: Cigarettes    Passive exposure: Never   Smokeless tobacco: Former    Types: Snuff  Vaping Use   Vaping status: Every Day  Substance and Sexual Activity   Alcohol use: No   Drug use: Never   Sexual activity: Yes  Other Topics Concern   Not on file  Social History Narrative   Not on file   Social Drivers of Health   Financial Resource Strain: Not on file  Food Insecurity: Low Risk  (01/19/2024)   Received from [facility]   Hunger Vital Sign    Worried About Running Out of Food in the Last Year: Never true    Ran Out of Food in the Last Year: Never true  Transportation Needs: No Transportation Needs (01/19/2024)   Received from [facility]    In the past 12 months, has  lack of reliable transportation kept you from medical appointments, meetings, work or from getting things needed for daily living? : No  Physical Activity: Not on file  Stress: Not on file  Social Connections: Not on file  Intimate Partner Violence: Not on file     Physical Exam   Vitals:   02/05/24 0052  BP: 132/86  Pulse: 61  Resp: 17  Temp: 97.7 F (36.5 C)  SpO2: 98%    CONSTITUTIONAL: Well-appearing, NAD NEURO/PSYCH:  Alert and oriented x 3, no focal deficits EYES:  eyes equal and reactive ENT/NECK:  no LAD, no JVD CARDIO: Regular rate, well-perfused, normal S1 and S2 PULM:  CTAB no wheezing or rhonchi GI/GU:  non-distended, non-tender MSK/SPINE:  No gross deformities, no edema SKIN:  no rash,  atraumatic   *Additional and/or pertinent findings included in MDM below  Diagnostic and Interventional Summary    EKG Interpretation Date/Time:    Ventricular Rate:    PR Interval:    QRS Duration:    QT Interval:    QTC Calculation:   R Axis:      Text Interpretation:         Labs Reviewed - No data to display  No orders to display    Medications  haloperidol  lactate (HALDOL ) injection 5 mg (5 mg Intramuscular Given 02/05/24 0216)     Procedures  /  Critical Care Procedures  ED Course and Medical Decision Making  Initial Impression and Ddx Sounds like patient is being evaluated for gastroparesis and possibly this is a flare.  Has had multiple recent ED visits for similar complaints.  Patient has normal vital signs and a soft and nontender abdomen, labs less than 48 hours ago reassuring, doubt any significant changes to these labs that would warrant repeat testing.  Providing symptomatic management and will reassess.  Past medical/surgical history that increases complexity of ED encounter: Recurrent abdominal pain  Interpretation of Diagnostics Laboratory and/or imaging options to aid in the diagnosis/care of the patient were considered.  After careful history and physical examination, it was determined that there was no indication for diagnostics at this time.  Patient Reassessment and Ultimate Disposition/Management     Patient feeling much better on reassessment, symptoms largely resolved.  Abdomen continues to be soft and nontender and vital signs are normal.  Given this and his recent reassuring evaluations here in the emergency department and his prompt follow-up in a few hours with his GI specialist, there is no indication for further testing or admission here in the emergency department and patient can be discharged with return precautions.  Patient management required discussion with the following services or consulting groups:  None  Complexity of Problems  Addressed Acute illness or injury that poses threat of life of bodily function  Additional Data Reviewed and Analyzed Further history obtained from: Recent Consult notes and Prior labs/imaging results  Additional Factors Impacting ED Encounter Risk Use of parenteral controlled substances  Hargun Spurling M. Theadore, MD [facility] [email or web address]  Final Clinical Impressions(s) / ED Diagnoses     ICD-10-CM   1. Epigastric pain  R10.13       ED Discharge Orders     None        Discharge Instructions Discussed with and Provided to Patient:     Discharge Instructions      You were evaluated in the Emergency Department and after careful evaluation, we did not find any emergent condition requiring admission or  further testing in the hospital.  Your exam/testing today is overall reassuring.  Recommend keeping your follow-up later today with the GI specialists.  Please return to the Emergency Department if you experience any worsening of your condition.   Thank you for allowing us  to be a part of your care.       Theadore Ozell HERO, MD 02/05/24 [phone]

## 2024-02-05 NOTE — ED Triage Notes (Signed)
 Pt arrived via [facility] from home c/o on-going chest discomfort, GERD and Pt was seen here recently for same.

## 2024-02-07 ENCOUNTER — Emergency Department (HOSPITAL_COMMUNITY): Payer: 59

## 2024-02-07 ENCOUNTER — Encounter (HOSPITAL_COMMUNITY): Payer: Self-pay

## 2024-02-07 ENCOUNTER — Other Ambulatory Visit: Payer: Self-pay

## 2024-02-07 ENCOUNTER — Inpatient Hospital Stay (HOSPITAL_COMMUNITY)
Admission: EM | Admit: 2024-02-07 | Discharge: 2024-02-10 | DRG: 419 | Disposition: A | Discharge status: Home / Self Care | Payer: 59 | Attending: General Surgery | Admitting: General Surgery

## 2024-02-07 DIAGNOSIS — K219 Gastro-esophageal reflux disease without esophagitis: Secondary | ICD-10-CM | POA: Diagnosis present

## 2024-02-07 DIAGNOSIS — K8010 Calculus of gallbladder with chronic cholecystitis without obstruction: Secondary | ICD-10-CM

## 2024-02-07 DIAGNOSIS — I10 Essential (primary) hypertension: Secondary | ICD-10-CM | POA: Diagnosis present

## 2024-02-07 DIAGNOSIS — Z6833 Body mass index (BMI) 33.0-33.9, adult: Secondary | ICD-10-CM | POA: Diagnosis not present

## 2024-02-07 DIAGNOSIS — Z79899 Other long term (current) drug therapy: Secondary | ICD-10-CM

## 2024-02-07 DIAGNOSIS — R17 Unspecified jaundice: Secondary | ICD-10-CM

## 2024-02-07 DIAGNOSIS — K8019 Calculus of gallbladder with other cholecystitis with obstruction: Principal | ICD-10-CM

## 2024-02-07 DIAGNOSIS — R1310 Dysphagia, unspecified: Secondary | ICD-10-CM | POA: Diagnosis present

## 2024-02-07 DIAGNOSIS — K810 Acute cholecystitis: Secondary | ICD-10-CM | POA: Diagnosis present

## 2024-02-07 DIAGNOSIS — K8012 Calculus of gallbladder with acute and chronic cholecystitis without obstruction: Secondary | ICD-10-CM | POA: Diagnosis present

## 2024-02-07 DIAGNOSIS — K8000 Calculus of gallbladder with acute cholecystitis without obstruction: Secondary | ICD-10-CM | POA: Diagnosis not present

## 2024-02-07 DIAGNOSIS — E66811 Obesity, class 1: Secondary | ICD-10-CM | POA: Diagnosis present

## 2024-02-07 DIAGNOSIS — R109 Unspecified abdominal pain: Secondary | ICD-10-CM | POA: Diagnosis present

## 2024-02-07 DIAGNOSIS — F17290 Nicotine dependence, other tobacco product, uncomplicated: Secondary | ICD-10-CM | POA: Diagnosis present

## 2024-02-07 LAB — BASIC METABOLIC PANEL
Anion gap: 10 (ref 5–15)
BUN: 8 mg/dL (ref 6–20)
CO2: 25 mmol/L (ref 22–32)
Calcium: 9.4 mg/dL (ref 8.9–10.3)
Chloride: 103 mmol/L (ref 98–111)
Creatinine, Ser: 1.01 mg/dL (ref 0.61–1.24)
GFR, Estimated: >60 mL/min (ref >60)
Glucose, Bld: 117 mg/dL — ABNORMAL HIGH (ref 70–99)
Potassium: 3.7 mmol/L (ref 3.5–5.1)
Sodium: 138 mmol/L (ref 135–145)

## 2024-02-07 LAB — HEPATIC FUNCTION PANEL
ALT: 325 U/L — ABNORMAL HIGH (ref 0–44)
AST: 179 U/L — ABNORMAL HIGH (ref 15–41)
Albumin: 3.9 g/dL (ref 3.5–5.0)
Alkaline Phosphatase: 109 U/L (ref 38–126)
Bilirubin, Direct: 2.6 mg/dL — ABNORMAL HIGH (ref 0.0–0.2)
Indirect Bilirubin: 1.7 mg/dL — ABNORMAL HIGH (ref 0.3–0.9)
Total Bilirubin: 4.3 mg/dL — ABNORMAL HIGH (ref 0.0–1.2)
Total Protein: 6.9 g/dL (ref 6.5–8.1)

## 2024-02-07 LAB — CBC
HCT: 42 % (ref 39.0–52.0)
Hemoglobin: 14.8 g/dL (ref 13.0–17.0)
MCH: 30.8 pg (ref 26.0–34.0)
MCHC: 35.2 g/dL (ref 30.0–36.0)
MCV: 87.3 fL (ref 80.0–100.0)
Platelets: 308 10*3/uL (ref 150–400)
RBC: 4.81 MIL/uL (ref 4.22–5.81)
RDW: 12.1 % (ref 11.5–15.5)
WBC: 6.1 10*3/uL (ref 4.0–10.5)
nRBC: 0 % (ref 0.0–0.2)

## 2024-02-07 LAB — TROPONIN I (HIGH SENSITIVITY)
Troponin I (High Sensitivity): 7 ng/L (ref <18)
Troponin I (High Sensitivity): 6 ng/L (ref <18)

## 2024-02-07 LAB — LIPASE, BLOOD: Lipase: 45 U/L (ref 11–51)

## 2024-02-07 MED ORDER — ACETAMINOPHEN 325 MG PO TABS: 650.0000 mg | ORAL_TABLET | Freq: Four times a day (QID) | ORAL | Status: DC | PRN

## 2024-02-07 MED ORDER — LIDOCAINE VISCOUS HCL 2 % MT SOLN
15.0000 mL | Freq: Once | OROMUCOSAL | Status: AC
  Administered 2024-02-07: 15 mL via ORAL
  Filled 2024-02-07: qty 15

## 2024-02-07 MED ORDER — LACTATED RINGERS IV BOLUS
1000.0000 mL | Freq: Once | INTRAVENOUS | Status: AC
  Administered 2024-02-07: 1000 mL via INTRAVENOUS

## 2024-02-07 MED ORDER — ONDANSETRON HCL 4 MG/2ML IJ SOLN
4.0000 mg | Freq: Four times a day (QID) | INTRAMUSCULAR | Status: DC | PRN
  Administered 2024-02-09: 4 mg via INTRAVENOUS

## 2024-02-07 MED ORDER — ONDANSETRON HCL 4 MG/2ML IJ SOLN
4.0000 mg | Freq: Once | INTRAMUSCULAR | Status: AC
  Administered 2024-02-07: 4 mg via INTRAVENOUS
  Filled 2024-02-07: qty 2

## 2024-02-07 MED ORDER — ONDANSETRON HCL 4 MG PO TABS: 4.0000 mg | ORAL_TABLET | Freq: Four times a day (QID) | ORAL | Status: DC | PRN

## 2024-02-07 MED ORDER — MORPHINE SULFATE (PF) 2 MG/ML IV SOLN
1.0000 mg | INTRAVENOUS | Status: DC | PRN
  Administered 2024-02-07 – 2024-02-08 (×5): 1 mg via INTRAVENOUS
  Filled 2024-02-07 (×5): qty 1

## 2024-02-07 MED ORDER — SODIUM CHLORIDE 0.9 % IV SOLN
2.0000 g | INTRAVENOUS | Status: DC
  Administered 2024-02-07 – 2024-02-09 (×3): 2 g via INTRAVENOUS
  Filled 2024-02-07 (×3): qty 20

## 2024-02-07 MED ORDER — CITALOPRAM HYDROBROMIDE 20 MG PO TABS
10.0000 mg | ORAL_TABLET | Freq: Every day | ORAL | Status: DC
  Administered 2024-02-07 – 2024-02-10 (×4): 10 mg via ORAL
  Filled 2024-02-07 (×4): qty 1

## 2024-02-07 MED ORDER — MELATONIN 3 MG PO TABS
6.0000 mg | ORAL_TABLET | Freq: Once | ORAL | Status: AC
  Administered 2024-02-07: 6 mg via ORAL
  Filled 2024-02-07: qty 2

## 2024-02-07 MED ORDER — ENOXAPARIN SODIUM 40 MG/0.4ML IJ SOSY
40.0000 mg | PREFILLED_SYRINGE | INTRAMUSCULAR | Status: DC
  Administered 2024-02-07 – 2024-02-09 (×3): 40 mg via SUBCUTANEOUS
  Filled 2024-02-07 (×3): qty 0.4

## 2024-02-07 MED ORDER — ACETAMINOPHEN 650 MG RE SUPP: 650.0000 mg | Freq: Four times a day (QID) | RECTAL | Status: DC | PRN

## 2024-02-07 MED ORDER — ALUM & MAG HYDROXIDE-SIMETH 200-200-20 MG/5ML PO SUSP
30.0000 mL | Freq: Once | ORAL | Status: AC
  Administered 2024-02-07: 30 mL via ORAL
  Filled 2024-02-07: qty 30

## 2024-02-07 MED ORDER — PANTOPRAZOLE SODIUM 40 MG PO TBEC
40.0000 mg | DELAYED_RELEASE_TABLET | Freq: Every day | ORAL | Status: DC
  Administered 2024-02-07 – 2024-02-10 (×4): 40 mg via ORAL
  Filled 2024-02-07 (×4): qty 1

## 2024-02-07 NOTE — ED Triage Notes (Signed)
 Pt c/o chest pain and upper abdominal pain. Pt states having n/v/d for 3 days. Pt see's a specialist who is doing testing for [place]kalosia.

## 2024-02-07 NOTE — H&P (Signed)
 History and Physical    Mitchell Quinn:[id number] DOB: 1991/11/29 DOA: 02/07/2024  PCP: Atilano Deward ORN, MD   Patient coming from: Home  Chief Complaint: Epigastric abdominal pain/nausea/vomiting  HPI: Mitchell Quinn is a 33 y.o. male with medical history significant for GERD, and dysphagia who is undergoing workup with GI due to issues with recurrent abdominal pain as well as nausea and vomiting.  He has recently undergone EGD 12/2023 with no significant findings noted.  He states that there may be some concern for mild gastroparesis.  He claims to be scared of eating due to his symptoms starting after eating and states he is lost approximately 14 pounds in the last 2 weeks.  He denies any change to his bowel habits or sick contacts.  Denies hematemesis or blood in his stools.  No fevers or chills noted at home.   ED Course: Vital signs are stable and laboratory data within normal limits.  Chest x-ray with no acute findings and abdominal ultrasound concerning for cholelithiasis and possible acute cholecystitis.  EDP spoke with general surgery who will plan to evaluate further.  He was given some Zofran , Maalox, and 1 L fluid bolus.  Review of Systems: Reviewed as noted above, otherwise negative.  Past Medical History:  Diagnosis Date   Gastroesophageal reflux disease, unspecified whether esophagitis present    Hypertension    IBS (irritable bowel syndrome)    Pneumonia     Past Surgical History:  Procedure Laterality Date   41 HOUR PH STUDY N/A 02/04/2024   Procedure: 24 HOUR PH STUDY;  Surgeon: Shila Gustav GAILS, MD;  Location: [facility];  Service: Gastroenterology;  Laterality: N/A;   BIOPSY  01/01/2024   Procedure: BIOPSY;  Surgeon: Cinderella Deatrice FALCON, MD;  [facility];  Service: Endoscopy;;   ESOPHAGEAL DILATION N/A 01/01/2024   Procedure: ESOPHAGEAL DILATION;  Surgeon: Cinderella Deatrice FALCON, MD;  [facility];  Service: Endoscopy;  Laterality: N/A;    ESOPHAGEAL MANOMETRY N/A 02/04/2024   Procedure: ESOPHAGEAL MANOMETRY (EM);  Surgeon: Shila Gustav GAILS, MD;  Location: [facility];  Service: Gastroenterology;  Laterality: N/A;   ESOPHAGOGASTRODUODENOSCOPY (EGD) WITH PROPOFOL  N/A 01/01/2024   Procedure: ESOPHAGOGASTRODUODENOSCOPY (EGD) WITH PROPOFOL ;  Surgeon: Cinderella Deatrice FALCON, MD;  [facility];  Service: Endoscopy;  Laterality: N/A;   ORIF WRIST FRACTURE       reports that he has quit smoking. His smoking use included cigarettes. He has never been exposed to tobacco smoke. He has quit using smokeless tobacco.  His smokeless tobacco use included snuff. He reports that he does not drink alcohol and does not use drugs.  No Known Allergies  Family History  Problem Relation Age of Onset   Healthy Mother    Achalasia Mother    Healthy Father    Other Cousin        lower intestines removed, not working anymore   Colon cancer Neg Hx     Prior to Admission medications   Medication Sig Start Date End Date Taking? Authorizing Provider  citalopram  (CELEXA ) 10 MG tablet Take 1 tablet by mouth daily. 01/21/24   [provider]  lidocaine  (XYLOCAINE ) 2 % solution Use as directed 15 mLs in the mouth or throat every 6 (six) hours as needed (chest pain/gerd). 12/05/23   Ezzard Sonny RAMAN, PA-C  omeprazole  (PRILOSEC) 40 MG capsule Take 1 capsule (40 mg total) by mouth daily. 01/01/24 01/31/24  Ahmed, Muhammad F, MD  ondansetron  (ZOFRAN -ODT) 4  MG disintegrating tablet Take 1 tablet (4 mg total) by mouth every 8 (eight) hours as needed for nausea or vomiting. 12/21/23   Raford Lenis, MD  pantoprazole  (PROTONIX ) 40 MG tablet Take 1 tablet (40 mg total) by mouth daily. 11/16/23   Albertina Dixon, MD    Physical Exam: Vitals:   02/07/24 0736 02/07/24 0754 02/07/24 0800 02/07/24 1000  BP:   (!) 150/96 131/89  Pulse:   64 65  Resp: 16  13 20   Temp: 98.5 F (36.9 C)     TempSrc: Oral     SpO2:   95% 96%  Weight:  108.9 kg    Height:  5'  11 (1.803 m)      Constitutional: NAD, calm, comfortable Vitals:   02/07/24 0736 02/07/24 0754 02/07/24 0800 02/07/24 1000  BP:   (!) 150/96 131/89  Pulse:   64 65  Resp: 16  13 20   Temp: 98.5 F (36.9 C)     TempSrc: Oral     SpO2:   95% 96%  Weight:  108.9 kg    Height:  5' 11 (1.803 m)     Eyes: lids and conjunctivae normal Neck: normal, supple Respiratory: clear to auscultation bilaterally. Normal respiratory effort. No accessory muscle use.  Cardiovascular: Regular rate and rhythm, no murmurs. Abdomen: no tenderness, no distention. Bowel sounds positive.  Musculoskeletal:  No edema. Skin: no rashes, lesions, ulcers.  Psychiatric: Flat affect  Labs on Admission: I have personally reviewed following labs and imaging studies  CBC: Recent Labs  Lab 02/03/24 0027 02/07/24 0756  WBC 15.2* 6.1  NEUTROABS 12.3*  --   HGB 13.9 14.8  HCT 39.4 42.0  MCV 88.3 87.3  PLT 288 308   Basic Metabolic Panel: Recent Labs  Lab 02/03/24 0027 02/07/24 0756  NA 138 138  K 4.2 3.7  CL 102 103  CO2 29 25  GLUCOSE 116* 117*  BUN 13 8  CREATININE 0.97 1.01  CALCIUM 9.1 9.4   GFR: Estimated Creatinine Clearance: 129.3 mL/min (by C-G formula based on SCr of 1.01 mg/dL). Liver Function Tests: Recent Labs  Lab 02/03/24 0027 02/07/24 0941  AST 16 179*  ALT 18 325*  ALKPHOS 61 109  BILITOT 0.8 4.3*  PROT 7.0 6.9  ALBUMIN 4.3 3.9   Recent Labs  Lab 02/03/24 0027 02/07/24 0941  LIPASE 40 45   No results for input(s): AMMONIA in the last 168 hours. Coagulation Profile: No results for input(s): INR, PROTIME in the last 168 hours. Cardiac Enzymes: No results for input(s): CKTOTAL, CKMB, CKMBINDEX, TROPONINI in the last 168 hours. BNP (last 3 results) No results for input(s): PROBNP in the last 8760 hours. HbA1C: No results for input(s): HGBA1C in the last 72 hours. CBG: No results for input(s): GLUCAP in the last 168 hours. Lipid Profile: No  results for input(s): CHOL, HDL, LDLCALC, TRIG, CHOLHDL, LDLDIRECT in the last 72 hours. Thyroid Function Tests: No results for input(s): TSH, T4TOTAL, FREET4, T3FREE, THYROIDAB in the last 72 hours. Anemia Panel: No results for input(s): VITAMINB12, FOLATE, FERRITIN, TIBC, IRON, RETICCTPCT in the last 72 hours. Urine analysis:    Component Value Date/Time   COLORURINE YELLOW 01/04/2023 0409   APPEARANCEUR CLEAR 01/04/2023 0409   LABSPEC 1.016 01/04/2023 0409   PHURINE 7.0 01/04/2023 0409   GLUCOSEU NEGATIVE 01/04/2023 0409   HGBUR NEGATIVE 01/04/2023 0409   BILIRUBINUR NEGATIVE 01/04/2023 0409   KETONESUR NEGATIVE 01/04/2023 0409   PROTEINUR NEGATIVE 01/04/2023 0409   UROBILINOGEN  0.2 02/01/2016 1953   NITRITE NEGATIVE 01/04/2023 0409   LEUKOCYTESUR NEGATIVE 01/04/2023 0409    Radiological Exams on Admission: US  Abdomen Limited RUQ (LIVER/GB) Result Date: 02/07/2024 CLINICAL DATA:  Transaminitis, nausea, vomiting, diarrhea, right upper quadrant pain EXAM: ULTRASOUND ABDOMEN LIMITED RIGHT UPPER QUADRANT COMPARISON:  CT 01/04/2023 FINDINGS: Gallbladder: Multiple gallstones measuring up to 9 mm in diameter. The gallbladder wall is thickened measuring up to 8 mm. No pericholecystic fluid is visualized. No sonographic Murphy sign noted by sonographer. Common bile duct: Diameter: 3 mm. Liver: No focal lesion identified. Within normal limits in parenchymal echogenicity. Portal vein is patent on color Doppler imaging with normal direction of blood flow towards the liver. Other: None. IMPRESSION: Cholelithiasis with gallbladder wall thickening. No pericholecystic fluid or sonographic Murphy sign. Gallbladder wall thickening may be due to acute cholecystitis or hepatic pathology. Consider HIDA scan for further evaluation for acute cholecystitis. Electronically Signed   By: Norman Gatlin M.D.   On: 02/07/2024 11:45   DG Chest 2 View Result Date: 02/07/2024 CLINICAL  DATA:  Chest pain EXAM: CHEST - 2 VIEW COMPARISON:  02/03/2024 FINDINGS: The lungs are clear without focal pneumonia, edema, pneumothorax or pleural effusion. The cardiopericardial silhouette is within normal limits for size. No acute bony abnormality. Telemetry leads overlie the chest. IMPRESSION: No active cardiopulmonary disease. Electronically Signed   By: Camellia Candle M.D.   On: 02/07/2024 09:19    EKG: Independently reviewed. SR 53bpm.  Assessment/Plan Principal Problem:   Acute cholecystitis Active Problems:   Abdominal pain   GERD (gastroesophageal reflux disease)   Dysphagia    Epigastric abdominal pain with postprandial nausea and vomiting suspicious for cholecystitis -Ultrasound with some cholelithiasis but no other overt signs of cholecystitis -No leukocytosis or fever noted -May benefit from further evaluation with HIDA scan -Keep n.p.o. for now and on IV fluid -IV Zofran  as needed for nausea or vomiting -Appreciate general surgery evaluation -Already undergoing further workup outpatient with GI  GERD -PPI  Obesity, class I -BMI 33.47   DVT prophylaxis: Lovenox  Code Status: Full Family Communication: None at bedside Disposition Plan:Admit for GS evaluation Consults called:General surgery Admission status: Inpatient, MedSurg  Severity of Illness: The appropriate patient status for this patient is INPATIENT. Inpatient status is judged to be reasonable and necessary in order to provide the required intensity of service to ensure the patient's safety. The patient's presenting symptoms, physical exam findings, and initial radiographic and laboratory data in the context of their chronic comorbidities is felt to place them at high risk for further clinical deterioration. Furthermore, it is not anticipated that the patient will be medically stable for discharge from the hospital within 2 midnights of admission.   * I certify that at the point of admission it is my  clinical judgment that the patient will require inpatient hospital care spanning beyond 2 midnights from the point of admission due to high intensity of service, high risk for further deterioration and high frequency of surveillance required.*   Jayelyn Barno D Shaleigh Laubscher DO [facility]  If 7PM-7AM, please contact night-coverage [email or web address]  02/07/2024, 1:48 PM

## 2024-02-07 NOTE — ED Notes (Signed)
 Attempted to call report twice

## 2024-02-07 NOTE — Consult Note (Signed)
 [facility] Consult  Reason for Consult: Acute cholecystitis? Elevated bilirubin  Referring Physician:  Dr. Maree  Chief Complaint   Abdominal Pain     HPI: Mitchell Quinn is a 33 y.o. male with recent issue with chest pain and upper abdominal pain and an extensive workup with EGD, gastric emptying, pH studies. He has a family history of achalasia. He had his pain in the epigastric and chest but also some in the right and had pizza last night. He does say that some of these episodes have been after pizza or [employer]. Mitchell Quinn  His workup this time included labs and US . His labwork showed elevated LFTs and bilirubin and he has stones in his gallbladder. He has some thickening of the gallbladder wall. He was admitted by the hospitalist.   Past Medical History:  Diagnosis Date   Gastroesophageal reflux disease, unspecified whether esophagitis present    Hypertension    IBS (irritable bowel syndrome)    Pneumonia     Past Surgical History:  Procedure Laterality Date   100 HOUR PH STUDY N/A 02/04/2024   Procedure: 24 HOUR PH STUDY;  Surgeon: Shila Gustav GAILS, MD;  Location: [facility];  Service: Gastroenterology;  Laterality: N/A;   BIOPSY  01/01/2024   Procedure: BIOPSY;  Surgeon: Cinderella Deatrice FALCON, MD;  [facility];  Service: Endoscopy;;   ESOPHAGEAL DILATION N/A 01/01/2024   Procedure: ESOPHAGEAL DILATION;  Surgeon: Cinderella Deatrice FALCON, MD;  [facility];  Service: Endoscopy;  Laterality: N/A;   ESOPHAGEAL MANOMETRY N/A 02/04/2024   Procedure: ESOPHAGEAL MANOMETRY (EM);  Surgeon: Shila Gustav GAILS, MD;  Location: [facility];  Service: Gastroenterology;  Laterality: N/A;   ESOPHAGOGASTRODUODENOSCOPY (EGD) WITH PROPOFOL  N/A 01/01/2024   Procedure: ESOPHAGOGASTRODUODENOSCOPY (EGD) WITH PROPOFOL ;  Surgeon: Cinderella Deatrice FALCON, MD;  [facility];  Service: Endoscopy;  Laterality: N/A;   ORIF WRIST FRACTURE      Family History  Problem Relation  Age of Onset   Healthy Mother    Achalasia Mother    Healthy Father    Other Cousin        lower intestines removed, not working anymore   Colon cancer Neg Hx     Social History   Tobacco Use   Smoking status: Former    Types: Cigarettes    Passive exposure: Never   Smokeless tobacco: Former    Types: Snuff  Vaping Use   Vaping status: Every Day  Substance Use Topics   Alcohol use: No   Drug use: Never    Medications: I have reviewed the patient's current medications. Prior to Admission:  Medications Prior to Admission  Medication Sig Dispense Refill Last Dose/Taking   citalopram  (CELEXA ) 10 MG tablet Take 1 tablet by mouth daily.      lidocaine  (XYLOCAINE ) 2 % solution Use as directed 15 mLs in the mouth or throat every 6 (six) hours as needed (chest pain/gerd). 100 mL 1    omeprazole  (PRILOSEC) 40 MG capsule Take 1 capsule (40 mg total) by mouth daily. 90 capsule 1    ondansetron  (ZOFRAN -ODT) 4 MG disintegrating tablet Take 1 tablet (4 mg total) by mouth every 8 (eight) hours as needed for nausea or vomiting. 20 tablet 2    pantoprazole  (PROTONIX ) 40 MG tablet Take 1 tablet (40 mg total) by mouth daily. 30 tablet 0    Scheduled:  citalopram   10 mg Oral Daily   enoxaparin  (LOVENOX ) injection  40 mg Subcutaneous Q24H  pantoprazole   40 mg Oral Daily   Continuous:  cefTRIAXone  (ROCEPHIN )  IV Stopped (02/07/24 1529)   PRN:acetaminophen  **OR** acetaminophen , morphine  injection, ondansetron  **OR** ondansetron  (ZOFRAN ) IV  No Known Allergies   ROS:  A comprehensive review of systems was negative except for: Gastrointestinal: positive for abdominal pain and epigastric pain, some pain in the back and right neck  Blood pressure 129/82, pulse 64, temperature 98.5 F (36.9 C), temperature source Oral, resp. rate 14, height 5' 11 (1.803 m), weight 108.9 kg, SpO2 96%. Physical Exam Vitals reviewed.  HENT:     Head: Normocephalic.  Eyes:     Extraocular Movements:  Extraocular movements intact.  Cardiovascular:     Rate and Rhythm: Normal rate.  Pulmonary:     Effort: Pulmonary effort is normal.  Abdominal:     General: There is no distension.     Palpations: Abdomen is soft.     Tenderness: There is abdominal tenderness in the right upper quadrant and epigastric area.  Skin:    General: Skin is warm.  Neurological:     General: No focal deficit present.     Mental Status: He is alert and oriented to person, place, and time.  Psychiatric:        Mood and Affect: Mood normal.     Results: Results for orders placed or performed during the hospital encounter of 02/07/24 (from the past 48 hours)  Basic metabolic panel     Status: Abnormal   Collection Time: 02/07/24  7:56 AM  Result Value Ref Range   Sodium 138 135 - 145 mmol/L   Potassium 3.7 3.5 - 5.1 mmol/L   Chloride 103 98 - 111 mmol/L   CO2 25 22 - 32 mmol/L   Glucose, Bld 117 (H) 70 - 99 mg/dL    Comment: Glucose reference range applies only to samples taken after fasting for at least 8 hours.   BUN 8 6 - 20 mg/dL   Creatinine, Ser 8.98 0.61 - 1.24 mg/dL   Calcium 9.4 8.9 - 89.6 mg/dL   GFR, Estimated >39 >39 mL/min    Comment: (NOTE) Calculated using the CKD-EPI Creatinine Equation (2021)    Anion gap 10 5 - 15    Comment: Performed at [facility], [address] [place], [place] [address]  CBC     Status: None   Collection Time: 02/07/24  7:56 AM  Result Value Ref Range   WBC 6.1 4.0 - 10.5 K/uL   RBC 4.81 4.22 - 5.81 MIL/uL   Hemoglobin 14.8 13.0 - 17.0 g/dL   HCT 57.9 60.9 - 47.9 %   MCV 87.3 80.0 - 100.0 fL   MCH 30.8 26.0 - 34.0 pg   MCHC 35.2 30.0 - 36.0 g/dL   RDW 87.8 88.4 - 84.4 %   Platelets 308 150 - 400 K/uL   nRBC 0.0 0.0 - 0.2 %    Comment: Performed at [facility], [address], [place], [place] [address]  Troponin I (High Sensitivity)     Status: None   Collection Time: 02/07/24  7:56 AM  Result Value Ref Range   Troponin I (High Sensitivity) 7 <18  ng/L    Comment: (NOTE) Elevated high sensitivity troponin I (hsTnI) values and significant  changes across serial measurements may suggest ACS but many other  chronic and acute conditions are known to elevate hsTnI results.  Refer to the Links section for chest pain algorithms and additional  guidance. Performed at [facility],  [address], [place], [place] [address]   Lipase, blood     Status: None   Collection Time: 02/07/24  9:41 AM  Result Value Ref Range   Lipase 45 11 - 51 U/L    Comment: Performed at [facility], [address], [place], [place] [address]  Hepatic function panel     Status: Abnormal   Collection Time: 02/07/24  9:41 AM  Result Value Ref Range   Total Protein 6.9 6.5 - 8.1 g/dL   Albumin 3.9 3.5 - 5.0 g/dL   AST 820 (H) 15 - 41 U/L   ALT 325 (H) 0 - 44 U/L   Alkaline Phosphatase 109 38 - 126 U/L   Total Bilirubin 4.3 (H) 0.0 - 1.2 mg/dL   Bilirubin, Direct 2.6 (H) 0.0 - 0.2 mg/dL   Indirect Bilirubin 1.7 (H) 0.3 - 0.9 mg/dL    Comment: Performed at [facility], [address], [place], [place] [address]  Troponin I (High Sensitivity)     Status: None   Collection Time: 02/07/24  9:41 AM  Result Value Ref Range   Troponin I (High Sensitivity) 6 <18 ng/L    Comment: (NOTE) Elevated high sensitivity troponin I (hsTnI) values and significant  changes across serial measurements may suggest ACS but many other  chronic and acute conditions are known to elevate hsTnI results.  Refer to the Links section for chest pain algorithms and additional  guidance. Performed at [facility], [address] [place], [place] [address]    Personally reviewed and showed patient- stones with thickened wall  US  Abdomen Limited RUQ (LIVER/GB) Result Date: 02/07/2024 CLINICAL DATA:  Transaminitis, nausea, vomiting, diarrhea, right upper quadrant pain EXAM: ULTRASOUND ABDOMEN LIMITED RIGHT UPPER QUADRANT COMPARISON:  CT 01/04/2023 FINDINGS: Gallbladder: Multiple gallstones  measuring up to 9 mm in diameter. The gallbladder wall is thickened measuring up to 8 mm. No pericholecystic fluid is visualized. No sonographic Murphy sign noted by sonographer. Common bile duct: Diameter: 3 mm. Liver: No focal lesion identified. Within normal limits in parenchymal echogenicity. Portal vein is patent on color Doppler imaging with normal direction of blood flow towards the liver. Other: None. IMPRESSION: Cholelithiasis with gallbladder wall thickening. No pericholecystic fluid or sonographic Murphy sign. Gallbladder wall thickening may be due to acute cholecystitis or hepatic pathology. Consider HIDA scan for further evaluation for acute cholecystitis. Electronically Signed   By: Norman Gatlin M.D.   On: 02/07/2024 11:45   DG Chest 2 View Result Date: 02/07/2024 CLINICAL DATA:  Chest pain EXAM: CHEST - 2 VIEW COMPARISON:  02/03/2024 FINDINGS: The lungs are clear without focal pneumonia, edema, pneumothorax or pleural effusion. The cardiopericardial silhouette is within normal limits for size. No acute bony abnormality. Telemetry leads overlie the chest. IMPRESSION: No active cardiopulmonary disease. Electronically Signed   By: Camellia Candle M.D.   On: 02/07/2024 09:19     Assessment & Plan:  Mitchell Quinn is a 33 y.o. male with likely acute cholecystitis and elevated LFTs. I  think he has acute cholecystitis and also he could have the potential for passing a stone with the elevated bilirubin.   He is admitted to the hospital PRN for pain Labs in the AM Clear diet today Ceftriaxone  ordered for cholecystitis  Discussed if his LFTs going up then will have to get MRCP to ensure no choledocholithiasis  Hopefully if LFTs  trending down then plan for OR Monday for Robotic Cholecystectomy   All questions were answered to the satisfaction of  the patient.   Manuelita JAYSON Pander 02/07/2024, 2:09 PM

## 2024-02-07 NOTE — ED Provider Notes (Signed)
 [place] EMERGENCY DEPARTMENT AT [facility] Provider Note   CSN: [id number] Arrival date & time: 02/07/24  9283     History  Chief Complaint  Patient presents with   Abdominal Pain    Mitchell Quinn is a 33 y.o. male with PMH as listed below who presents with abdominal pain.   Presented to the ED for the same on 02/04/23. In fact he has been to the ED 9 times since September 2024 for this problem.  Patient having epigastric discomfort moderate to severe not going away.  Seems consistent with his prior flares of his chronic GI issue.SABRASABRADenies fever, no nausea vomiting or diarrhea, no chest pain or shortness of breath.  He states for the last 3 days he has had epigastric abd/chest pain, nausea/vomiting, inability to tolerate PO. Also he states he has had RUQ and R flank pain that is slightly different than normal, but his pain in general feels the same as it typically does. He reports GI cocktail and nausea medicine IV has helped him in the past. Endorses dark urine and is concerned for dehydration. Denies flu-like illnesses or fever/chills.   Previous workup includes: Had negative CTA chest on 01/01/24. EGD on 01/01/24 did not show any endoscopic esophageal abnormality, s/p esophageal dilation/biopsy. Gastric emptying study on 01/08/24 shows slightly delayed gastric emptying suggestive of gastroparesis. Esophogram/barium swallow on 01/09/24 within normal limits. Was started on citalopram  for neuromodulator effects and recommended for f/u. Admitted from 01/19/24-01/21/24 at [facility], GI recommended no inpatient intervention, SSRI for hypersensitivity, scheduled antiemetics, and nutrition consult. Concerned for esophageal dysmotility syndrome, like esophageal spasm or achalasia. Planned for outpatient w/u.    Past Medical History:  Diagnosis Date   Gastroesophageal reflux disease, unspecified whether esophagitis present    Hypertension    IBS (irritable bowel syndrome)     Pneumonia        Home Medications Prior to Admission medications   Medication Sig Start Date End Date Taking? Authorizing Provider  lidocaine  (XYLOCAINE ) 2 % solution Use as directed 15 mLs in the mouth or throat every 6 (six) hours as needed (chest pain/gerd). 12/05/23   Ezzard Sonny RAMAN, PA-C  omeprazole  (PRILOSEC) 40 MG capsule Take 1 capsule (40 mg total) by mouth daily. 01/01/24 01/31/24  Cinderella Deatrice FALCON, MD  ondansetron  (ZOFRAN -ODT) 4 MG disintegrating tablet Take 1 tablet (4 mg total) by mouth every 8 (eight) hours as needed for nausea or vomiting. 12/21/23   Raford Lenis, MD  pantoprazole  (PROTONIX ) 40 MG tablet Take 1 tablet (40 mg total) by mouth daily. 11/16/23   Kommor, Lum, MD      Allergies    Patient has no known allergies.    Review of Systems   Review of Systems A 10 point review of systems was performed and is negative unless otherwise reported in HPI.  Physical Exam Updated Vital Signs BP (!) 140/93   Pulse (!) 57   Temp 98.5 F (36.9 C) (Oral)   Resp 16   Ht 5' 11 (1.803 m)   Wt 108.9 kg   SpO2 96%   BMI 33.47 kg/m  Physical Exam General: Normal appearing male, lying in bed.  HEENT: Sclera anicteric, dry mucous membranes, trachea midline.  Cardiology: RRR, no murmurs/rubs/gallops.  Resp: Normal respiratory rate and effort. CTAB, no wheezes, rhonchi, crackles.  Abd: Soft, TTP in RUQ and epigastric region, non-distended. No rebound tenderness or guarding.  GU: Deferred. MSK: No peripheral edema or signs of trauma. Extremities without deformity  or TTP. No cyanosis or clubbing. Skin: warm, dry. Back: No CVA tenderness Neuro: A&Ox4, CNs II-XII grossly intact. MAEs. Sensation grossly intact.  Psych: Normal mood and affect.   ED Results / Procedures / Treatments   Labs (all labs ordered are listed, but only abnormal results are displayed) Labs Reviewed  BASIC METABOLIC PANEL - Abnormal; Notable for the following components:      Result Value    Glucose, Bld 117 (*)    All other components within normal limits  HEPATIC FUNCTION PANEL - Abnormal; Notable for the following components:   AST 179 (*)    ALT 325 (*)    Total Bilirubin 4.3 (*)    Bilirubin, Direct 2.6 (*)    Indirect Bilirubin 1.7 (*)    All other components within normal limits  CBC  LIPASE, BLOOD  HIV ANTIBODY (ROUTINE TESTING W REFLEX)  MAGNESIUM   TROPONIN I (HIGH SENSITIVITY)  TROPONIN I (HIGH SENSITIVITY)    EKG EKG Interpretation Date/Time:  Saturday February 07 2024 07:36:02 EST Ventricular Rate:  53 PR Interval:  197 QRS Duration:  110 QT Interval:  434 QTC Calculation: 408 R Axis:   56  Text Interpretation: Sinus rhythm Probable inferior infarct, old Confirmed by Franklyn Gills [phone]) on 02/07/2024 12:02:51 PM  Radiology US  Abdomen Limited RUQ (LIVER/GB) Result Date: 02/07/2024 CLINICAL DATA:  Transaminitis, nausea, vomiting, diarrhea, right upper quadrant pain EXAM: ULTRASOUND ABDOMEN LIMITED RIGHT UPPER QUADRANT COMPARISON:  CT 01/04/2023 FINDINGS: Gallbladder: Multiple gallstones measuring up to 9 mm in diameter. The gallbladder wall is thickened measuring up to 8 mm. No pericholecystic fluid is visualized. No sonographic Murphy sign noted by sonographer. Common bile duct: Diameter: 3 mm. Liver: No focal lesion identified. Within normal limits in parenchymal echogenicity. Portal vein is patent on color Doppler imaging with normal direction of blood flow towards the liver. Other: None. IMPRESSION: Cholelithiasis with gallbladder wall thickening. No pericholecystic fluid or sonographic Murphy sign. Gallbladder wall thickening may be due to acute cholecystitis or hepatic pathology. Consider HIDA scan for further evaluation for acute cholecystitis. Electronically Signed   By: Norman Gatlin M.D.   On: 02/07/2024 11:45    Procedures Procedures    Medications Ordered in ED Medications  alum & mag hydroxide-simeth (MAALOX/MYLANTA) 200-200-20 MG/5ML  suspension 30 mL (30 mLs Oral Given 02/07/24 0929)    And  lidocaine  (XYLOCAINE ) 2 % viscous mouth solution 15 mL (15 mLs Oral Given 02/07/24 0929)  ondansetron  (ZOFRAN ) injection 4 mg (4 mg Intravenous Given 02/07/24 0928)  lactated ringers  bolus 1,000 mL (0 mLs Intravenous Stopped 02/07/24 1045)  melatonin tablet 6 mg (6 mg Oral Given 02/07/24 2221)    ED Course/ Medical Decision Making/ A&P                          Medical Decision Making Amount and/or Complexity of Data Reviewed Labs: ordered. Decision-making details documented in ED Course. Radiology: ordered. Decision-making details documented in ED Course.  Risk OTC drugs. Prescription drug management. Decision regarding hospitalization.    This patient presents to the ED for concern of abdominal pain, nausea/vomiting, this involves an extensive number of treatment options, and is a complaint that carries with it a high risk of complications and morbidity.  I considered the following differential and admission for this acute, potentially life threatening condition.   MDM:    For DDX for chest/abdominal pain includes but is not limited to:  Abdominal exam without peritoneal signs. No  evidence of acute abdomen at this time. Consider patient's known espohageal issues, possibly achalasia. Consider also acute hepatobiliary disease (including acute cholecystitis or cholangitis), acute pancreatitis,  bowel obstruction. Will give fluids/antiemetics and obtain labs/imaging. CXR doesn't show any PNA, pneumomediastinum. No urinary sxs to indicate pyelonephritis. Lower c/f nephrolithiasis. EKG and troponins reassuring against ACS. No signs/sxs of DVT, no tachycardia/tachypnea/hypoxia, lower c/f PE. Afebrile, no SIRS.    Clinical Course as of 02/09/24 0950  Sat Feb 07, 2024  0933 CBC wnl [HN]  [phone] Basic metabolic panel(!) Unremarkable in the context of this patient's presentation  [HN]  0933 Troponin I (High Sensitivity): 7 neg [HN]  0933  DG Chest 2 View No active cardiopulmonary disease. [HN]  1016 Troponin I (High Sensitivity): 6 neg [HN]  1016 ALT(!): 325 [HN]  1016 AST(!): 179 New elevated LFTs. Consider biliary stasis, choledocholithiasis, cholecystitis. Will get RUQ US . [HN]  1047 Total Bilirubin(!): 4.3 +elevated T bili [HN]  1150 US  Abdomen Limited RUQ (LIVER/GB) Cholelithiasis with gallbladder wall thickening. No pericholecystic fluid or sonographic Murphy sign. Gallbladder wall thickening may be due to acute cholecystitis or hepatic pathology. Consider HIDA scan for further evaluation for acute cholecystitis.   [HN]  1151 Consulted to GI [HN]  1202 D/w Dr. Eartha who recommends reaching out to general surgery.  [HN]  1244 D/w Dr. Kallie with surgery who will come to see the patient. Will admit to medicine. [HN]    Clinical Course User Index [HN] Franklyn Sid SAILOR, MD    Labs: I Ordered, and personally interpreted labs.  The pertinent results include:  those listed above  Imaging Studies ordered: I ordered imaging studies including CXR, RUQ US  I independently visualized and interpreted imaging. I agree with the radiologist interpretation  Additional history obtained from chart review.    Cardiac Monitoring: The patient was maintained on a cardiac monitor.  I personally viewed and interpreted the cardiac monitored which showed an underlying rhythm of: NSR  Reevaluation: After the interventions noted above, I reevaluated the patient and found that they have :improved  Social Determinants of Health: Lives independently  Disposition:  Admitted to surgery  Co morbidities that complicate the patient evaluation  Past Medical History:  Diagnosis Date   Gastroesophageal reflux disease, unspecified whether esophagitis present    Hypertension    IBS (irritable bowel syndrome)    Pneumonia      Medicines No orders of the defined types were placed in this encounter.   I have reviewed the  patients home medicines and have made adjustments as needed  Problem List / ED Course: Problem List Items Addressed This Visit   None Visit Diagnoses       Calculus of gallbladder with cholecystitis of other acuity with obstruction    -  Primary                   This note was created using dictation software, which may contain spelling or grammatical errors.    Franklyn Sid SAILOR, MD 02/09/24 1001

## 2024-02-07 NOTE — Plan of Care (Signed)

## 2024-02-07 NOTE — ED Notes (Signed)
 [place] at bedside

## 2024-02-08 DIAGNOSIS — R17 Unspecified jaundice: Secondary | ICD-10-CM | POA: Diagnosis not present

## 2024-02-08 DIAGNOSIS — K810 Acute cholecystitis: Secondary | ICD-10-CM | POA: Diagnosis not present

## 2024-02-08 LAB — COMPREHENSIVE METABOLIC PANEL
ALT: 265 U/L — ABNORMAL HIGH (ref 0–44)
AST: 95 U/L — ABNORMAL HIGH (ref 15–41)
Albumin: 4 g/dL (ref 3.5–5.0)
Alkaline Phosphatase: 114 U/L (ref 38–126)
Anion gap: 10 (ref 5–15)
BUN: 9 mg/dL (ref 6–20)
CO2: 25 mmol/L (ref 22–32)
Calcium: 9.2 mg/dL (ref 8.9–10.3)
Chloride: 101 mmol/L (ref 98–111)
Creatinine, Ser: 1.01 mg/dL (ref 0.61–1.24)
GFR, Estimated: >60 mL/min (ref >60)
Glucose, Bld: 111 mg/dL — ABNORMAL HIGH (ref 70–99)
Potassium: 3.5 mmol/L (ref 3.5–5.1)
Sodium: 136 mmol/L (ref 135–145)
Total Bilirubin: 2.2 mg/dL — ABNORMAL HIGH (ref 0.0–1.2)
Total Protein: 6.8 g/dL (ref 6.5–8.1)

## 2024-02-08 LAB — HIV ANTIBODY (ROUTINE TESTING W REFLEX): HIV Screen 4th Generation wRfx: NONREACTIVE

## 2024-02-08 LAB — MAGNESIUM: Magnesium: 2.1 mg/dL (ref 1.7–2.4)

## 2024-02-08 MED ORDER — CHLORHEXIDINE GLUCONATE CLOTH 2 % EX PADS
6.0000 | MEDICATED_PAD | Freq: Once | CUTANEOUS | Status: AC
  Administered 2024-02-08: 6 via TOPICAL

## 2024-02-08 MED ORDER — INDOCYANINE GREEN 25 MG IV SOLR
2.5000 mg | Freq: Once | INTRAVENOUS | Status: AC
  Filled 2024-02-08: qty 10

## 2024-02-08 MED ORDER — CEFOTETAN DISODIUM 2 G IJ SOLR
2.0000 g | INTRAMUSCULAR | Status: AC
  Administered 2024-02-09: 2 g via INTRAVENOUS
  Filled 2024-02-08: qty 2

## 2024-02-08 NOTE — H&P (View-Only) (Signed)
 [facility]  Doing well. LFTs coming down, bilirubin down.   BP 106/77 (BP Location: Left Arm)   Pulse (!) 57   Temp 97.9 F (36.6 C) (Oral)   Resp 18   Ht 5' 11 (1.803 m)   Wt 108.9 kg   SpO2 97%   BMI 33.47 kg/m  Soft, tender RUQ  Patient with acute cholecystitis. Lfts coming down. OR tomorrow.   PLAN: I counseled the patient about the indication, risks and benefits of robotic assisted laparoscopic cholecystectomy.  He understands there is a very small chance for bleeding, infection, injury to normal structures (including common bile duct), conversion to open surgery, persistent symptoms, evolution of postcholecystectomy diarrhea, need for secondary interventions, anesthesia reaction, cardiopulmonary issues and other risks not specifically detailed here. I described the expected recovery, the plan for follow-up and the restrictions during the recovery phase.  All questions were answered.  Labs in AM NPO midnight Soft diet today  Manuelita Pander, MD [facility] [address] [place], [place] [address] [phone] (office)

## 2024-02-08 NOTE — Progress Notes (Signed)
 [facility]  Doing well. LFTs coming down, bilirubin down.   BP 106/77 (BP Location: Left Arm)   Pulse (!) 57   Temp 97.9 F (36.6 C) (Oral)   Resp 18   Ht 5' 11 (1.803 m)   Wt 108.9 kg   SpO2 97%   BMI 33.47 kg/m  Soft, tender RUQ  Patient with acute cholecystitis. Lfts coming down. OR tomorrow.   PLAN: I counseled the patient about the indication, risks and benefits of robotic assisted laparoscopic cholecystectomy.  He understands there is a very small chance for bleeding, infection, injury to normal structures (including common bile duct), conversion to open surgery, persistent symptoms, evolution of postcholecystectomy diarrhea, need for secondary interventions, anesthesia reaction, cardiopulmonary issues and other risks not specifically detailed here. I described the expected recovery, the plan for follow-up and the restrictions during the recovery phase.  All questions were answered.  Labs in AM NPO midnight Soft diet today  Manuelita Pander, MD [facility] [address] [place], [place] [address] [phone] (office)

## 2024-02-08 NOTE — Plan of Care (Signed)
  Problem: Education: Goal: Knowledge of General Education information will improve Description: Including pain rating scale, medication(s)/side effects and non-pharmacologic comfort measures Outcome: Progressing   Problem: Clinical Measurements: Goal: Ability to maintain clinical measurements within normal limits will improve Outcome: Progressing   Problem: Activity: Goal: Risk for activity intolerance will decrease Outcome: Progressing   Problem: Nutrition: Goal: Adequate nutrition will be maintained Outcome: Progressing   Problem: Coping: Goal: Level of anxiety will decrease Outcome: Progressing   Problem: Elimination: Goal: Will not experience complications related to bowel motility Outcome: Progressing Goal: Will not experience complications related to urinary retention Outcome: Progressing   Problem: Pain Managment: Goal: General experience of comfort will improve and/or be controlled Outcome: Progressing

## 2024-02-08 NOTE — Progress Notes (Signed)
NO acute events overnight. Kellogg RN

## 2024-02-08 NOTE — Progress Notes (Signed)
   02/08/24 0811  TOC Brief Assessment  Insurance and Status Reviewed  Patient has primary care physician Yes  Home environment has been reviewed From home, spouse  Prior level of function: Independent  Prior/Current Home Services No current home services  Readmission risk has been reviewed Yes  Transition of care needs no transition of care needs at this time     Transition of Care Department [facility]) has reviewed patient and no TOC needs have been identified at this time. We will continue to monitor patient advancement through interdisciplinary progression rounds. If new patient transition needs arise, please place a TOC consult.

## 2024-02-09 ENCOUNTER — Inpatient Hospital Stay (HOSPITAL_COMMUNITY): Payer: 59 | Admitting: Certified Registered"

## 2024-02-09 ENCOUNTER — Inpatient Hospital Stay (HOSPITAL_COMMUNITY): Payer: 59 | Admitting: Anesthesiology

## 2024-02-09 ENCOUNTER — Encounter (HOSPITAL_COMMUNITY)
Admission: EM | Disposition: A | Discharge status: Home / Self Care | Payer: Self-pay | Source: Home / Self Care | Attending: General Surgery

## 2024-02-09 ENCOUNTER — Encounter (HOSPITAL_COMMUNITY): Payer: Self-pay | Admitting: Internal Medicine

## 2024-02-09 DIAGNOSIS — K8019 Calculus of gallbladder with other cholecystitis with obstruction: Secondary | ICD-10-CM

## 2024-02-09 DIAGNOSIS — K8000 Calculus of gallbladder with acute cholecystitis without obstruction: Secondary | ICD-10-CM

## 2024-02-09 DIAGNOSIS — K8010 Calculus of gallbladder with chronic cholecystitis without obstruction: Secondary | ICD-10-CM

## 2024-02-09 LAB — COMPREHENSIVE METABOLIC PANEL
ALT: 213 U/L — ABNORMAL HIGH (ref 0–44)
AST: 60 U/L — ABNORMAL HIGH (ref 15–41)
Albumin: 4 g/dL (ref 3.5–5.0)
Alkaline Phosphatase: 120 U/L (ref 38–126)
Anion gap: 8 (ref 5–15)
BUN: 13 mg/dL (ref 6–20)
CO2: 26 mmol/L (ref 22–32)
Calcium: 9 mg/dL (ref 8.9–10.3)
Chloride: 103 mmol/L (ref 98–111)
Creatinine, Ser: 1.07 mg/dL (ref 0.61–1.24)
GFR, Estimated: >60 mL/min (ref >60)
Glucose, Bld: 97 mg/dL (ref 70–99)
Potassium: 3.3 mmol/L — ABNORMAL LOW (ref 3.5–5.1)
Sodium: 137 mmol/L (ref 135–145)
Total Bilirubin: 1.3 mg/dL — ABNORMAL HIGH (ref 0.0–1.2)
Total Protein: 6.9 g/dL (ref 6.5–8.1)

## 2024-02-09 SURGERY — CHOLECYSTECTOMY, ROBOT-ASSISTED, LAPAROSCOPIC
Anesthesia: General | Site: Abdomen

## 2024-02-09 SURGERY — CHOLECYSTECTOMY, ROBOT-ASSISTED, LAPAROSCOPIC
Anesthesia: General

## 2024-02-09 MED ORDER — LACTATED RINGERS IV SOLN
INTRAVENOUS | Status: DC | PRN
Start: 2024-02-09 — End: 2024-02-09

## 2024-02-09 MED ORDER — MIDAZOLAM HCL 2 MG/2ML IJ SOLN
INTRAMUSCULAR | Status: DC | PRN
  Administered 2024-02-09: 2 mg via INTRAVENOUS

## 2024-02-09 MED ORDER — HYDROMORPHONE HCL 1 MG/ML IJ SOLN
1.0000 mg | INTRAMUSCULAR | Status: DC | PRN
  Administered 2024-02-09: 1 mg via INTRAVENOUS
  Filled 2024-02-09: qty 1

## 2024-02-09 MED ORDER — LIDOCAINE HCL (PF) 2 % IJ SOLN
INTRAMUSCULAR | Status: AC
  Filled 2024-02-09: qty 5

## 2024-02-09 MED ORDER — PHENYLEPHRINE 80 MCG/ML (10ML) SYRINGE FOR IV PUSH (FOR BLOOD PRESSURE SUPPORT)
PREFILLED_SYRINGE | INTRAVENOUS | Status: AC
  Filled 2024-02-09: qty 10

## 2024-02-09 MED ORDER — SODIUM CHLORIDE 0.9 % IV SOLN
INTRAVENOUS | Status: AC
  Filled 2024-02-09: qty 2

## 2024-02-09 MED ORDER — STERILE WATER FOR IRRIGATION IR SOLN
Status: DC | PRN
  Administered 2024-02-09: 500 mL

## 2024-02-09 MED ORDER — DEXAMETHASONE SODIUM PHOSPHATE 10 MG/ML IJ SOLN
INTRAMUSCULAR | Status: DC | PRN
  Administered 2024-02-09: 5 mg via INTRAVENOUS

## 2024-02-09 MED ORDER — ONDANSETRON 4 MG PO TBDP: 4.0000 mg | ORAL_TABLET | Freq: Four times a day (QID) | ORAL | Status: DC | PRN

## 2024-02-09 MED ORDER — PROPOFOL 10 MG/ML IV BOLUS
INTRAVENOUS | Status: DC | PRN
  Administered 2024-02-09: 200 mg via INTRAVENOUS

## 2024-02-09 MED ORDER — CHLORHEXIDINE GLUCONATE 0.12 % MT SOLN
OROMUCOSAL | Status: AC
  Administered 2024-02-09: 15 mL
  Filled 2024-02-09: qty 15

## 2024-02-09 MED ORDER — ROCURONIUM BROMIDE 10 MG/ML (PF) SYRINGE
PREFILLED_SYRINGE | INTRAVENOUS | Status: AC
  Filled 2024-02-09: qty 10

## 2024-02-09 MED ORDER — EPHEDRINE SULFATE-NACL 50-0.9 MG/10ML-% IV SOSY
PREFILLED_SYRINGE | INTRAVENOUS | Status: DC | PRN
  Administered 2024-02-09 (×2): 5 mg via INTRAVENOUS
  Administered 2024-02-09: 10 mg via INTRAVENOUS

## 2024-02-09 MED ORDER — BUPIVACAINE HCL (PF) 0.5 % IJ SOLN
INTRAMUSCULAR | Status: AC
  Filled 2024-02-09: qty 30

## 2024-02-09 MED ORDER — INDOCYANINE GREEN 25 MG IV SOLR
INTRAVENOUS | Status: AC
  Administered 2024-02-09: 2.5 mg via INTRAVENOUS
  Filled 2024-02-09: qty 10

## 2024-02-09 MED ORDER — LIDOCAINE HCL (PF) 2 % IJ SOLN
INTRAMUSCULAR | Status: DC | PRN
  Administered 2024-02-09: 100 mg via INTRADERMAL

## 2024-02-09 MED ORDER — FENTANYL CITRATE PF 50 MCG/ML IJ SOSY
25.0000 ug | PREFILLED_SYRINGE | INTRAMUSCULAR | Status: DC | PRN
  Administered 2024-02-09 (×3): 50 ug via INTRAVENOUS
  Filled 2024-02-09 (×3): qty 1

## 2024-02-09 MED ORDER — OXYCODONE HCL 5 MG PO TABS
5.0000 mg | ORAL_TABLET | ORAL | Status: DC | PRN
  Administered 2024-02-10 (×2): 10 mg via ORAL
  Filled 2024-02-09 (×2): qty 2

## 2024-02-09 MED ORDER — KETOROLAC TROMETHAMINE 30 MG/ML IJ SOLN: 30.0000 mg | Freq: Four times a day (QID) | INTRAMUSCULAR | Status: DC | PRN

## 2024-02-09 MED ORDER — ROCURONIUM BROMIDE 10 MG/ML (PF) SYRINGE
PREFILLED_SYRINGE | INTRAVENOUS | Status: DC | PRN
  Administered 2024-02-09: 60 mg via INTRAVENOUS

## 2024-02-09 MED ORDER — FENTANYL CITRATE (PF) 250 MCG/5ML IJ SOLN
INTRAMUSCULAR | Status: AC
  Filled 2024-02-09: qty 5

## 2024-02-09 MED ORDER — ONDANSETRON HCL 4 MG/2ML IJ SOLN
4.0000 mg | Freq: Four times a day (QID) | INTRAMUSCULAR | Status: DC | PRN
  Administered 2024-02-09: 4 mg via INTRAVENOUS
  Filled 2024-02-09: qty 2

## 2024-02-09 MED ORDER — PHENYLEPHRINE 80 MCG/ML (10ML) SYRINGE FOR IV PUSH (FOR BLOOD PRESSURE SUPPORT)
PREFILLED_SYRINGE | INTRAVENOUS | Status: DC | PRN
  Administered 2024-02-09 (×3): 160 ug via INTRAVENOUS

## 2024-02-09 MED ORDER — FENTANYL CITRATE (PF) 250 MCG/5ML IJ SOLN
INTRAMUSCULAR | Status: DC | PRN
  Administered 2024-02-09 (×3): 50 ug via INTRAVENOUS

## 2024-02-09 MED ORDER — ONDANSETRON HCL 4 MG/2ML IJ SOLN
INTRAMUSCULAR | Status: AC
  Filled 2024-02-09: qty 2

## 2024-02-09 MED ORDER — MIDAZOLAM HCL 2 MG/2ML IJ SOLN: 1.0000 mg | Freq: Once | INTRAMUSCULAR | Status: DC | PRN

## 2024-02-09 MED ORDER — SUGAMMADEX SODIUM 200 MG/2ML IV SOLN
INTRAVENOUS | Status: DC | PRN
  Administered 2024-02-09: 300 mg via INTRAVENOUS

## 2024-02-09 MED ORDER — METOCLOPRAMIDE HCL 5 MG/ML IJ SOLN: 10.0000 mg | Freq: Once | INTRAMUSCULAR | Status: DC | PRN

## 2024-02-09 MED ORDER — BUPIVACAINE HCL (PF) 0.5 % IJ SOLN
INTRAMUSCULAR | Status: DC | PRN
Start: 2024-02-09 — End: 2024-02-09
  Administered 2024-02-09: 30 mL

## 2024-02-09 MED ORDER — HEMOSTATIC AGENTS (NO CHARGE) OPTIME
TOPICAL | Status: DC | PRN
  Administered 2024-02-09: 1 via TOPICAL

## 2024-02-09 MED ORDER — MEPERIDINE HCL 50 MG/ML IJ SOLN: 6.2500 mg | INTRAMUSCULAR | Status: DC | PRN

## 2024-02-09 MED ORDER — MIDAZOLAM HCL 2 MG/2ML IJ SOLN
INTRAMUSCULAR | Status: AC
  Filled 2024-02-09: qty 2

## 2024-02-09 MED ORDER — EPHEDRINE 5 MG/ML INJ
INTRAVENOUS | Status: AC
  Filled 2024-02-09: qty 5

## 2024-02-09 MED ORDER — LACTATED RINGERS IV SOLN: INTRAVENOUS | Status: DC

## 2024-02-09 MED ORDER — PROPOFOL 10 MG/ML IV BOLUS
INTRAVENOUS | Status: AC
  Filled 2024-02-09: qty 20

## 2024-02-09 MED ORDER — SIMETHICONE 80 MG PO CHEW: 40.0000 mg | CHEWABLE_TABLET | Freq: Four times a day (QID) | ORAL | Status: DC | PRN

## 2024-02-09 MED ORDER — KETOROLAC TROMETHAMINE 30 MG/ML IJ SOLN
30.0000 mg | Freq: Four times a day (QID) | INTRAMUSCULAR | Status: AC
  Administered 2024-02-09: 30 mg via INTRAVENOUS
  Filled 2024-02-09: qty 1

## 2024-02-09 MED ORDER — DEXMEDETOMIDINE HCL IN NACL 80 MCG/20ML IV SOLN
INTRAVENOUS | Status: DC | PRN
Start: 2024-02-09 — End: 2024-02-09
  Administered 2024-02-09 (×2): 8 ug via INTRAVENOUS

## 2024-02-09 SURGICAL SUPPLY — 37 items
CAUTERY HOOK MNPLR 1.6 DVNC XI (INSTRUMENTS) ×2 IMPLANT
CHLORAPREP W/TINT 26 (MISCELLANEOUS) ×2 IMPLANT
CLIP LIGATING HEM O LOK PURPLE (MISCELLANEOUS) ×2 IMPLANT
DERMABOND ADVANCED .7 DNX12 (GAUZE/BANDAGES/DRESSINGS) ×2 IMPLANT
DRAPE ARM DVNC X/XI (DISPOSABLE) ×8 IMPLANT
DRAPE COLUMN DVNC XI (DISPOSABLE) ×2 IMPLANT
ELECT REM PT RETURN 9FT ADLT (ELECTROSURGICAL) ×1 IMPLANT
ELECTRODE REM PT RTRN 9FT ADLT (ELECTROSURGICAL) ×2 IMPLANT
FORCEPS BPLR R/ABLATION 8 DVNC (INSTRUMENTS) ×2 IMPLANT
FORCEPS PROGRASP DVNC XI (FORCEP) ×2 IMPLANT
GLOVE BIOGEL PI IND STRL 7.0 (GLOVE) ×4 IMPLANT
GLOVE SURG SS PI 7.5 STRL IVOR (GLOVE) ×4 IMPLANT
GOWN STRL REUS W/TWL LRG LVL3 (GOWN DISPOSABLE) ×6 IMPLANT
HEMOSTAT SNOW SURGICEL 2X4 (HEMOSTASIS) IMPLANT
IRRIGATOR SUCT 8 DISP DVNC XI (IRRIGATION / IRRIGATOR) IMPLANT
KIT TURNOVER KIT A (KITS) ×2 IMPLANT
MANIFOLD NEPTUNE II (INSTRUMENTS) ×2 IMPLANT
NDL HYPO 21X1.5 SAFETY (NEEDLE) ×2 IMPLANT
NDL INSUFFLATION 14GA 120MM (NEEDLE) ×2 IMPLANT
NEEDLE HYPO 21X1.5 SAFETY (NEEDLE) ×1 IMPLANT
NEEDLE INSUFFLATION 14GA 120MM (NEEDLE) ×1 IMPLANT
OBTURATOR OPTICAL STND 8 DVNC (TROCAR) ×1 IMPLANT
OBTURATOR OPTICALSTD 8 DVNC (TROCAR) ×2 IMPLANT
PACK LAP CHOLE LZT030E (CUSTOM PROCEDURE TRAY) ×2 IMPLANT
PAD ARMBOARD 7.5X6 YLW CONV (MISCELLANEOUS) ×2 IMPLANT
PENCIL HANDSWITCHING (ELECTRODE) ×2 IMPLANT
POSITIONER HEAD 8X9X4 ADT (SOFTGOODS) ×2 IMPLANT
SEAL UNIV 5-12 XI (MISCELLANEOUS) ×8 IMPLANT
SET BASIN LINEN APH (SET/KITS/TRAYS/PACK) ×2 IMPLANT
SET TUBE SMOKE EVAC HIGH FLOW (TUBING) ×2 IMPLANT
SPIKE FLUID TRANSFER (MISCELLANEOUS) ×2 IMPLANT
SUT MNCRL AB 4-0 PS2 18 (SUTURE) ×4 IMPLANT
SUT VICRYL 0 AB UR-6 (SUTURE) ×2 IMPLANT
SYR 30ML LL (SYRINGE) ×2 IMPLANT
SYS RETRIEVAL 5MM INZII UNIV (BASKET) ×1 IMPLANT
SYSTEM RETRIEVL 5MM INZII UNIV (BASKET) ×2 IMPLANT
WATER STERILE IRR 500ML POUR (IV SOLUTION) ×2 IMPLANT

## 2024-02-09 NOTE — Plan of Care (Signed)

## 2024-02-09 NOTE — Op Note (Signed)
 Patient:  Mitchell Quinn  DOB:  Aug 27, 1991  MRN:  [id number]   Preop Diagnosis: Acute cholecystitis, cholelithiasis  Postop Diagnosis: Same  Procedure: Robotic assisted laparoscopic cholecystectomy  Surgeon: Alanda Allegra, MD  Anes: General Endotracheal  Indications: Patient is a 33 year old white male who was admitted to the hospital with acute cholecystitis secondary to cholelithiasis.  The risks and benefits of the procedure including bleeding, infection, hepatobiliary injury, and the possibility of an open procedure were fully explained to the patient, who gave informed consent.  Procedure note: The patient was placed in the supine position.  After induction of general endotracheal anesthesia, the abdomen was prepped and draped using the usual sterile technique with ChloraPrep.  Surgical site confirmation was performed.  An infraumbilical incision was made down to the fascia.  A Veress needle was introduced into the abdominal cavity and confirmation of placement was done using the saline drop test.  The abdomen was then insufflated to 15 mmHg pressure.  An 8 mm trocar was introduced into the abdominal cavity under direct visualization without difficulty.  The patient was placed in reverse Trendelenburg position and additional 8 mm trocars were placed in the left upper quadrant, right lower quadrant, and right flank regions.  The robot was then docked and targeted.  The liver appeared to be within normal limits.  The gallbladder was noted to be distended with a mildly thickened gallbladder wall.  The gallbladder was retracted in a dynamic fashion in order to provide a critical view of the triangle of Calot.  The cystic duct was first identified.  Its juncture to the infundibulum was fully identified.  This was confirmed by [facility].  Hem-o-lok clips were placed proximally and distally on the cystic duct and the cystic duct was divided.  This was likewise done to the cystic artery.  The  gallbladder was freed away from the gallbladder fossa using Bovie electrocautery.  The gallbladder was delivered through the left upper quadrant trocar site using an Endo Catch bag without difficulty.  The gallbladder fossa was inspected no abnormal bleeding or bile leakage was noted.  Surgicel was placed in the gallbladder fossa.  All fluid and air were then evacuated from the abdominal cavity after the robot was undocked.  All wounds were irrigated with normal saline.  All wounds were injected with 0.5% Sensorcaine .  The left upper quadrant fascia was reapproximated using an 0 Vicryl interrupted suture.  All skin incisions were closed using a 4-0 Monocryl subcuticular suture.  Dermabond was applied.  All tape and needle counts were correct at the end of the procedure.  The patient was extubated in the operating room and transferred to PACU in stable condition.  Complications: None  EBL: Minimal  Specimen: Gallbladder

## 2024-02-09 NOTE — Plan of Care (Signed)

## 2024-02-09 NOTE — Anesthesia Preprocedure Evaluation (Addendum)
 Anesthesia Evaluation  Patient identified by MRN, date of birth, ID band Patient awake    Reviewed: Allergy & Precautions, H&P , NPO status , Patient's Chart, lab work & pertinent test results, reviewed documented beta blocker date and time   Airway Mallampati: II  TM Distance: >3 FB Neck ROM: Full    Dental no notable dental hx.    Pulmonary former smoker   Pulmonary exam normal breath sounds clear to auscultation       Cardiovascular Exercise Tolerance: Good hypertension, Normal cardiovascular exam Rhythm:Regular Rate:Normal     Neuro/Psych negative neurological ROS  negative psych ROS   GI/Hepatic negative GI ROS, Neg liver ROS,GERD  ,,  Endo/Other  negative endocrine ROS    Renal/GU negative Renal ROS  negative genitourinary   Musculoskeletal   Abdominal   Peds  Hematology negative hematology ROS (+)   Anesthesia Other Findings   Reproductive/Obstetrics negative OB ROS                             Anesthesia Physical Anesthesia Plan  ASA: 2  Anesthesia Plan: General   Post-op Pain Management:    Induction: Intravenous  PONV Risk Score and Plan:   Airway Management Planned: Oral ETT  Additional Equipment:   Intra-op Plan:   Post-operative Plan: Extubation in OR  Informed Consent: I have reviewed the patients History and Physical, chart, labs and discussed the procedure including the risks, benefits and alternatives for the proposed anesthesia with the patient or authorized representative who has indicated his/her understanding and acceptance.     Dental Advisory Given  Plan Discussed with: CRNA  Anesthesia Plan Comments:        Anesthesia Quick Evaluation

## 2024-02-09 NOTE — Anesthesia Postprocedure Evaluation (Signed)
 Anesthesia Post Note  Patient: Mitchell Quinn  Procedure(s) Performed: XI ROBOTIC ASSISTED LAPAROSCOPIC CHOLECYSTECTOMY (Abdomen)  Patient location during evaluation: PACU Anesthesia Type: General Level of consciousness: awake and alert Pain management: pain level controlled Vital Signs Assessment: post-procedure vital signs reviewed and stable Respiratory status: spontaneous breathing, nonlabored ventilation, respiratory function stable and patient connected to nasal cannula oxygen Cardiovascular status: blood pressure returned to baseline and stable Postop Assessment: no apparent nausea or vomiting Anesthetic complications: no   No notable events documented.   Last Vitals:  Vitals:   02/09/24 1345 02/09/24 1400  BP: 114/66 113/68  Pulse: 61 (!) 56  Resp: 14 14  Temp:  36.4 C  SpO2: 93% 95%    Last Pain:  Vitals:   02/09/24 1402  TempSrc:   PainSc: 3                  Freddi Jaeger

## 2024-02-09 NOTE — Transfer of Care (Signed)
 Immediate Anesthesia Transfer of Care Note  Patient: Mitchell Quinn  Procedure(s) Performed: XI ROBOTIC ASSISTED LAPAROSCOPIC CHOLECYSTECTOMY (Abdomen)  Patient Location: PACU  Anesthesia Type:General  Level of Consciousness: awake  Airway & Oxygen Therapy: Patient Spontanous Breathing and Patient connected to face mask oxygen  Post-op Assessment: Report given to RN and Post -op Vital signs reviewed and stable  Post vital signs: Reviewed and stable  Last Vitals:  Vitals Value Taken Time  BP    Temp 36.4 C 02/09/24 1223  Pulse 70 02/09/24 1224  Resp    SpO2 99 % 02/09/24 1224  Vitals shown include unfiled device data.  Last Pain:  Vitals:   02/09/24 1038  TempSrc: Oral  PainSc: 0-No pain         Complications: No notable events documented.

## 2024-02-09 NOTE — Interval H&P Note (Signed)
 History and Physical Interval Note:  02/09/2024 10:51 AM  Mitchell Quinn  has presented today for surgery, with the diagnosis of Acute Choleystitis.  The various methods of treatment have been discussed with the patient and family. After consideration of risks, benefits and other options for treatment, the patient has consented to  Procedure(s): XI ROBOTIC ASSISTED LAPAROSCOPIC CHOLECYSTECTOMY (N/A) as a surgical intervention.  The patient's history has been reviewed, patient examined, no change in status, stable for surgery.  I have reviewed the patient's chart and labs.  Questions were answered to the patient's satisfaction.     Alanda Allegra

## 2024-02-10 ENCOUNTER — Other Ambulatory Visit: Payer: Self-pay

## 2024-02-10 ENCOUNTER — Other Ambulatory Visit (INDEPENDENT_AMBULATORY_CARE_PROVIDER_SITE_OTHER): Payer: 59 | Admitting: General Surgery

## 2024-02-10 ENCOUNTER — Encounter (HOSPITAL_COMMUNITY): Payer: Self-pay | Admitting: General Surgery

## 2024-02-10 ENCOUNTER — Inpatient Hospital Stay (HOSPITAL_COMMUNITY)
Admission: EM | Admit: 2024-02-10 | Discharge: 2024-02-14 | DRG: 440 | Disposition: A | Discharge status: Home / Self Care | Payer: 59 | Attending: General Surgery | Admitting: General Surgery

## 2024-02-10 DIAGNOSIS — K589 Irritable bowel syndrome without diarrhea: Secondary | ICD-10-CM | POA: Diagnosis present

## 2024-02-10 DIAGNOSIS — K5900 Constipation, unspecified: Secondary | ICD-10-CM | POA: Diagnosis not present

## 2024-02-10 DIAGNOSIS — K851 Biliary acute pancreatitis without necrosis or infection: Secondary | ICD-10-CM | POA: Diagnosis not present

## 2024-02-10 DIAGNOSIS — Z79899 Other long term (current) drug therapy: Secondary | ICD-10-CM

## 2024-02-10 DIAGNOSIS — R1013 Epigastric pain: Secondary | ICD-10-CM | POA: Diagnosis not present

## 2024-02-10 DIAGNOSIS — Z9049 Acquired absence of other specified parts of digestive tract: Secondary | ICD-10-CM

## 2024-02-10 DIAGNOSIS — K859 Acute pancreatitis without necrosis or infection, unspecified: Principal | ICD-10-CM

## 2024-02-10 DIAGNOSIS — Z09 Encounter for follow-up examination after completed treatment for conditions other than malignant neoplasm: Secondary | ICD-10-CM

## 2024-02-10 DIAGNOSIS — I10 Essential (primary) hypertension: Secondary | ICD-10-CM | POA: Diagnosis present

## 2024-02-10 DIAGNOSIS — Z87891 Personal history of nicotine dependence: Secondary | ICD-10-CM

## 2024-02-10 LAB — CBC
HCT: 38.3 % — ABNORMAL LOW (ref 39.0–52.0)
Hemoglobin: 13.5 g/dL (ref 13.0–17.0)
MCH: 31.1 pg (ref 26.0–34.0)
MCHC: 35.2 g/dL (ref 30.0–36.0)
MCV: 88.2 fL (ref 80.0–100.0)
Platelets: 298 10*3/uL (ref 150–400)
RBC: 4.34 MIL/uL (ref 4.22–5.81)
RDW: 12.1 % (ref 11.5–15.5)
WBC: 11.5 10*3/uL — ABNORMAL HIGH (ref 4.0–10.5)
nRBC: 0 % (ref 0.0–0.2)

## 2024-02-10 LAB — COMPREHENSIVE METABOLIC PANEL
ALT: 259 U/L — ABNORMAL HIGH (ref 0–44)
AST: 144 U/L — ABNORMAL HIGH (ref 15–41)
Albumin: 3.7 g/dL (ref 3.5–5.0)
Alkaline Phosphatase: 141 U/L — ABNORMAL HIGH (ref 38–126)
Anion gap: 10 (ref 5–15)
BUN: 9 mg/dL (ref 6–20)
CO2: 25 mmol/L (ref 22–32)
Calcium: 9 mg/dL (ref 8.9–10.3)
Chloride: 102 mmol/L (ref 98–111)
Creatinine, Ser: 0.88 mg/dL (ref 0.61–1.24)
GFR, Estimated: >60 mL/min (ref >60)
Glucose, Bld: 114 mg/dL — ABNORMAL HIGH (ref 70–99)
Potassium: 3.3 mmol/L — ABNORMAL LOW (ref 3.5–5.1)
Sodium: 137 mmol/L (ref 135–145)
Total Bilirubin: 2.9 mg/dL — ABNORMAL HIGH (ref 0.0–1.2)
Total Protein: 6.8 g/dL (ref 6.5–8.1)

## 2024-02-10 LAB — SURGICAL PATHOLOGY

## 2024-02-10 MED ORDER — OXYCODONE-ACETAMINOPHEN 5-325 MG PO TABS: 1.0000 | ORAL_TABLET | ORAL | 0 refills | Status: DC | PRN

## 2024-02-10 MED ORDER — OXYCODONE HCL 5 MG PO TABS: 5.0000 mg | ORAL_TABLET | ORAL | 0 refills | Status: DC | PRN

## 2024-02-10 NOTE — Discharge Summary (Signed)
Physician Discharge Summary  Patient ID: Mitchell Quinn MRN: [id number] DOB/AGE: Sep 17, 1991 33 y.o.  Admit date: 02/07/2024 Discharge date: 02/10/2024  Admission Diagnoses: Acute cholecystitis, cholelithiasis  Discharge Diagnoses:  Principal Problem:   Acute cholecystitis Active Problems:   Abdominal pain   GERD (gastroesophageal reflux disease)   Dysphagia   Elevated bilirubin   Calculus of gallbladder with cholecystitis   Acute calculous cholecystitis   Discharged Condition: good  Hospital Course: Patient is a 33 year old white male who presented emergency room with worsening right upper quadrant abdominal pain and nausea.  He was diagnosed with acute cholecystitis secondary to cholelithiasis.  He was admitted to the hospital and started on IV antibiotics.  He subsequently underwent a robotic assisted laparoscopic cholecystectomy on 02/09/2024.  He tolerated the surgery well.  His postoperative course has been unremarkable.  His diet was advanced out difficulty.  He still has some elevations of his LFTs and total bilirubin which is not uncommon after cholecystectomy.  He is being discharged home on 02/10/2024 in good and improving condition.  He will follow with Dr. Henreitta Leber in 1 week for repeat liver enzyme tests.   Treatments: surgery: Robotic assisted laparoscopic cholecystectomy on 02/09/2024  Discharge Exam: Blood pressure 123/74, pulse 63, temperature 98 F (36.7 C), temperature source Oral, resp. rate 14, height 5\' 11"  (1.803 m), weight 108.9 kg, SpO2 94%. General appearance: alert, cooperative, and no distress Resp: clear to auscultation bilaterally Cardio: regular rate and rhythm, S1, S2 normal, no murmur, click, rub or gallop GI: Soft, incisions healing well.  Disposition: Discharge disposition: 01-Home or Self Care       Discharge Instructions     Diet - low sodium heart healthy   Complete by: As directed    Increase activity slowly   Complete by: As directed        Allergies as of 02/10/2024   No Known Allergies      Medication List     TAKE these medications    citalopram 10 MG tablet Commonly known as: CELEXA Take 1 tablet by mouth daily.   ibuprofen 200 MG tablet Commonly known as: ADVIL Take 400 mg by mouth every 6 (six) hours as needed for mild pain (pain score 1-3).   lidocaine 2 % solution Commonly known as: XYLOCAINE Use as directed 15 mLs in the mouth or throat every 6 (six) hours as needed (chest pain/gerd).   ondansetron 4 MG disintegrating tablet Commonly known as: ZOFRAN-ODT Take 1 tablet (4 mg total) by mouth every 8 (eight) hours as needed for nausea or vomiting.   oxyCODONE 5 MG immediate release tablet Commonly known as: Roxicodone Take 1 tablet (5 mg total) by mouth every 4 (four) hours as needed.   pantoprazole 40 MG tablet Commonly known as: PROTONIX Take 1 tablet (40 mg total) by mouth daily.        Follow-up Information     Lucretia Roers, MD Follow up on 02/17/2024.   Specialty: General Surgery Contact information: [address] [place] [address] [phone]                 Signed: Franky Macho 02/10/2024, 9:20 AM

## 2024-02-10 NOTE — Progress Notes (Signed)
Roxicodone not available.  Switched to [employer] 5mg 

## 2024-02-10 NOTE — ED Triage Notes (Addendum)
Pt POV c/o nausea and weakness after surgery yesterday having gallbladder removed. Liver enzyme are elevated per mother. Pt is jaundice. Pt c/o pain and states that he passed out earlier today. Pt super sleepy but will respond to voice.

## 2024-02-10 NOTE — Plan of Care (Signed)

## 2024-02-11 ENCOUNTER — Emergency Department (HOSPITAL_COMMUNITY): Payer: 59

## 2024-02-11 ENCOUNTER — Inpatient Hospital Stay (HOSPITAL_COMMUNITY): Payer: 59

## 2024-02-11 ENCOUNTER — Encounter (HOSPITAL_COMMUNITY): Payer: Self-pay | Admitting: General Surgery

## 2024-02-11 DIAGNOSIS — Z79899 Other long term (current) drug therapy: Secondary | ICD-10-CM | POA: Diagnosis not present

## 2024-02-11 DIAGNOSIS — K589 Irritable bowel syndrome without diarrhea: Secondary | ICD-10-CM | POA: Diagnosis present

## 2024-02-11 DIAGNOSIS — K851 Biliary acute pancreatitis without necrosis or infection: Secondary | ICD-10-CM | POA: Diagnosis present

## 2024-02-11 DIAGNOSIS — K5900 Constipation, unspecified: Secondary | ICD-10-CM | POA: Diagnosis not present

## 2024-02-11 DIAGNOSIS — I10 Essential (primary) hypertension: Secondary | ICD-10-CM | POA: Diagnosis present

## 2024-02-11 DIAGNOSIS — Z9049 Acquired absence of other specified parts of digestive tract: Secondary | ICD-10-CM | POA: Diagnosis not present

## 2024-02-11 DIAGNOSIS — Z87891 Personal history of nicotine dependence: Secondary | ICD-10-CM | POA: Diagnosis not present

## 2024-02-11 DIAGNOSIS — R1013 Epigastric pain: Secondary | ICD-10-CM | POA: Diagnosis present

## 2024-02-11 LAB — HEPATIC FUNCTION PANEL
ALT: 269 U/L — ABNORMAL HIGH (ref 0–44)
AST: 124 U/L — ABNORMAL HIGH (ref 15–41)
Albumin: 3.9 g/dL (ref 3.5–5.0)
Alkaline Phosphatase: 185 U/L — ABNORMAL HIGH (ref 38–126)
Bilirubin, Direct: 3.4 mg/dL — ABNORMAL HIGH (ref 0.0–0.2)
Indirect Bilirubin: 1.9 mg/dL — ABNORMAL HIGH (ref 0.3–0.9)
Total Bilirubin: 5.3 mg/dL — ABNORMAL HIGH (ref 0.0–1.2)
Total Protein: 7.2 g/dL (ref 6.5–8.1)

## 2024-02-11 LAB — BASIC METABOLIC PANEL
Anion gap: 9 (ref 5–15)
BUN: 10 mg/dL (ref 6–20)
CO2: 24 mmol/L (ref 22–32)
Calcium: 9.2 mg/dL (ref 8.9–10.3)
Chloride: 103 mmol/L (ref 98–111)
Creatinine, Ser: 1.05 mg/dL (ref 0.61–1.24)
GFR, Estimated: >60 mL/min (ref >60)
Glucose, Bld: 142 mg/dL — ABNORMAL HIGH (ref 70–99)
Potassium: 3.5 mmol/L (ref 3.5–5.1)
Sodium: 136 mmol/L (ref 135–145)

## 2024-02-11 LAB — URINALYSIS, ROUTINE W REFLEX MICROSCOPIC
Bilirubin Urine: NEGATIVE
Glucose, UA: NEGATIVE mg/dL
Hgb urine dipstick: NEGATIVE
Ketones, ur: NEGATIVE mg/dL
Leukocytes,Ua: NEGATIVE
Nitrite: NEGATIVE
Protein, ur: NEGATIVE mg/dL
Specific Gravity, Urine: 1.013 (ref 1.005–1.030)
pH: 6 (ref 5.0–8.0)

## 2024-02-11 LAB — CBC
HCT: 40.2 % (ref 39.0–52.0)
Hemoglobin: 14.2 g/dL (ref 13.0–17.0)
MCH: 31.8 pg (ref 26.0–34.0)
MCHC: 35.3 g/dL (ref 30.0–36.0)
MCV: 89.9 fL (ref 80.0–100.0)
Platelets: 301 10*3/uL (ref 150–400)
RBC: 4.47 MIL/uL (ref 4.22–5.81)
RDW: 12.7 % (ref 11.5–15.5)
WBC: 15 10*3/uL — ABNORMAL HIGH (ref 4.0–10.5)
nRBC: 0 % (ref 0.0–0.2)

## 2024-02-11 LAB — LIPASE, BLOOD: Lipase: 4376 U/L — ABNORMAL HIGH (ref 11–51)

## 2024-02-11 LAB — CBG MONITORING, ED: Glucose-Capillary: 101 mg/dL — ABNORMAL HIGH (ref 70–99)

## 2024-02-11 MED ORDER — HYDROMORPHONE HCL 1 MG/ML IJ SOLN
1.0000 mg | INTRAMUSCULAR | Status: DC | PRN
  Administered 2024-02-11 (×3): 1 mg via INTRAVENOUS
  Filled 2024-02-11 (×3): qty 1

## 2024-02-11 MED ORDER — METOCLOPRAMIDE HCL 5 MG/ML IJ SOLN
10.0000 mg | Freq: Once | INTRAMUSCULAR | Status: AC
  Administered 2024-02-11: 10 mg via INTRAVENOUS
  Filled 2024-02-11: qty 2

## 2024-02-11 MED ORDER — ONDANSETRON HCL 4 MG/2ML IJ SOLN
4.0000 mg | Freq: Four times a day (QID) | INTRAMUSCULAR | Status: DC | PRN
  Administered 2024-02-11: 4 mg via INTRAVENOUS
  Filled 2024-02-11: qty 2

## 2024-02-11 MED ORDER — PIPERACILLIN-TAZOBACTAM 3.375 G IVPB
3.3750 g | Freq: Three times a day (TID) | INTRAVENOUS | Status: DC
  Administered 2024-02-11 – 2024-02-14 (×9): 3.375 g via INTRAVENOUS
  Filled 2024-02-11 (×9): qty 50

## 2024-02-11 MED ORDER — ONDANSETRON 4 MG PO TBDP: 4.0000 mg | ORAL_TABLET | Freq: Four times a day (QID) | ORAL | Status: DC | PRN

## 2024-02-11 MED ORDER — SODIUM CHLORIDE 0.9 % IV SOLN: INTRAVENOUS | Status: AC

## 2024-02-11 MED ORDER — ONDANSETRON HCL 4 MG/2ML IJ SOLN
4.0000 mg | Freq: Four times a day (QID) | INTRAMUSCULAR | Status: DC | PRN
  Administered 2024-02-11 (×2): 4 mg via INTRAVENOUS
  Filled 2024-02-11 (×3): qty 2

## 2024-02-11 MED ORDER — ACETAMINOPHEN 650 MG RE SUPP: 650.0000 mg | Freq: Four times a day (QID) | RECTAL | Status: DC | PRN

## 2024-02-11 MED ORDER — SODIUM CHLORIDE 0.9 % IV BOLUS
1000.0000 mL | Freq: Once | INTRAVENOUS | Status: AC
Start: 2024-02-11 — End: 2024-02-11
  Administered 2024-02-11: 1000 mL via INTRAVENOUS

## 2024-02-11 MED ORDER — ACETAMINOPHEN 325 MG PO TABS
650.0000 mg | ORAL_TABLET | Freq: Four times a day (QID) | ORAL | Status: DC | PRN
  Administered 2024-02-12: 650 mg via ORAL
  Filled 2024-02-11 (×2): qty 2

## 2024-02-11 MED ORDER — ENOXAPARIN SODIUM 40 MG/0.4ML IJ SOSY
40.0000 mg | PREFILLED_SYRINGE | INTRAMUSCULAR | Status: DC
  Administered 2024-02-11 – 2024-02-14 (×4): 40 mg via SUBCUTANEOUS
  Filled 2024-02-11 (×4): qty 0.4

## 2024-02-11 MED ORDER — HYDROMORPHONE HCL 1 MG/ML IJ SOLN
1.0000 mg | INTRAMUSCULAR | Status: DC | PRN
  Administered 2024-02-11 (×2): 1 mg via INTRAVENOUS
  Filled 2024-02-11 (×2): qty 1

## 2024-02-11 MED ORDER — HYDROMORPHONE HCL 1 MG/ML IJ SOLN
0.5000 mg | Freq: Once | INTRAMUSCULAR | Status: AC
  Administered 2024-02-11: 0.5 mg via INTRAVENOUS
  Filled 2024-02-11: qty 0.5

## 2024-02-11 MED ORDER — OXYCODONE HCL 5 MG PO TABS
5.0000 mg | ORAL_TABLET | ORAL | Status: DC | PRN
  Administered 2024-02-11: 5 mg via ORAL
  Administered 2024-02-11: 10 mg via ORAL
  Administered 2024-02-11: 5 mg via ORAL
  Administered 2024-02-12 (×4): 10 mg via ORAL
  Administered 2024-02-12: 5 mg via ORAL
  Administered 2024-02-13 (×3): 10 mg via ORAL
  Filled 2024-02-11: qty 1
  Filled 2024-02-11: qty 2
  Filled 2024-02-11: qty 1
  Filled 2024-02-11 (×8): qty 2

## 2024-02-11 MED ORDER — ONDANSETRON HCL 4 MG/2ML IJ SOLN
4.0000 mg | Freq: Once | INTRAMUSCULAR | Status: AC
  Administered 2024-02-11: 4 mg via INTRAVENOUS
  Filled 2024-02-11: qty 2

## 2024-02-11 MED ORDER — SIMETHICONE 80 MG PO CHEW
40.0000 mg | CHEWABLE_TABLET | Freq: Four times a day (QID) | ORAL | Status: DC | PRN
  Administered 2024-02-12: 40 mg via ORAL
  Filled 2024-02-11: qty 1

## 2024-02-11 MED ORDER — BOOST / RESOURCE BREEZE PO LIQD CUSTOM
1.0000 | Freq: Three times a day (TID) | ORAL | Status: DC
  Administered 2024-02-11 – 2024-02-13 (×5): 1 via ORAL

## 2024-02-11 MED ORDER — PIPERACILLIN-TAZOBACTAM 3.375 G IVPB 30 MIN
3.3750 g | Freq: Once | INTRAVENOUS | Status: AC
  Administered 2024-02-11: 3.375 g via INTRAVENOUS
  Filled 2024-02-11: qty 50

## 2024-02-11 MED ORDER — LACTATED RINGERS IV SOLN: INTRAVENOUS | Status: AC

## 2024-02-11 MED ORDER — GADOBUTROL 1 MMOL/ML IV SOLN
10.0000 mL | Freq: Once | INTRAVENOUS | Status: AC | PRN
  Administered 2024-02-11: 10 mL via INTRAVENOUS

## 2024-02-11 MED ORDER — IOHEXOL 300 MG/ML  SOLN
100.0000 mL | Freq: Once | INTRAMUSCULAR | Status: AC | PRN
  Administered 2024-02-11: 100 mL via INTRAVENOUS

## 2024-02-11 MED ORDER — PANTOPRAZOLE SODIUM 40 MG PO TBEC
40.0000 mg | DELAYED_RELEASE_TABLET | Freq: Every day | ORAL | Status: DC
  Administered 2024-02-11 – 2024-02-14 (×4): 40 mg via ORAL
  Filled 2024-02-11 (×4): qty 1

## 2024-02-11 NOTE — ED Provider Notes (Signed)
[place] EMERGENCY DEPARTMENT AT [facility] Provider Note   CSN: [id number] Arrival date & time: 02/10/24  2315     History  Chief Complaint  Patient presents with   Weakness   Nausea    Mitchell Quinn is a 33 y.o. male.  Patient presents to the emergency department for evaluation of generalized weakness, nausea, vomiting, abdominal pain.  He underwent cholecystectomy yesterday.       Home Medications Prior to Admission medications   Medication Sig Start Date End Date Taking? Authorizing Provider  citalopram (CELEXA) 10 MG tablet Take 1 tablet by mouth daily. 01/21/24   [provider]  ibuprofen (ADVIL) 200 MG tablet Take 400 mg by mouth every 6 (six) hours as needed for mild pain (pain score 1-3).    [provider]  lidocaine (XYLOCAINE) 2 % solution Use as directed 15 mLs in the mouth or throat every 6 (six) hours as needed (chest pain/gerd). 12/05/23   Tiffany Kocher, PA-C  ondansetron (ZOFRAN-ODT) 4 MG disintegrating tablet Take 1 tablet (4 mg total) by mouth every 8 (eight) hours as needed for nausea or vomiting. 12/21/23   Dione Booze, MD  oxyCODONE-acetaminophen (PERCOCET/ROXICET) 5-325 MG tablet Take 1 tablet by mouth every 4 (four) hours as needed for severe pain (pain score 7-10). 02/10/24   Franky Macho, MD  pantoprazole (PROTONIX) 40 MG tablet Take 1 tablet (40 mg total) by mouth daily. 11/16/23   Kommor, Wyn Forster, MD      Allergies    Patient has no known allergies.    Review of Systems   Review of Systems  Physical Exam Updated Vital Signs BP 130/82   Pulse 68   Temp 98 F (36.7 C) (Oral)   Resp 18   SpO2 96%  Physical Exam Vitals and nursing note reviewed.  Constitutional:      General: He is not in acute distress.    Appearance: He is well-developed. He is ill-appearing.  HENT:     Head: Normocephalic and atraumatic.     Mouth/Throat:     Mouth: Mucous membranes are moist.  Eyes:     General: Vision grossly  intact. Gaze aligned appropriately.     Extraocular Movements: Extraocular movements intact.     Conjunctiva/sclera: Conjunctivae normal.  Cardiovascular:     Rate and Rhythm: Normal rate and regular rhythm.     Pulses: Normal pulses.     Heart sounds: Normal heart sounds, S1 normal and S2 normal. No murmur heard.    No friction rub. No gallop.  Pulmonary:     Effort: Pulmonary effort is normal. No respiratory distress.     Breath sounds: Normal breath sounds.  Abdominal:     Palpations: Abdomen is soft.     Tenderness: There is generalized abdominal tenderness. There is no guarding or rebound.     Hernia: No hernia is present.  Musculoskeletal:        General: No swelling.     Cervical back: Full passive range of motion without pain, normal range of motion and neck supple. No pain with movement, spinous process tenderness or muscular tenderness. Normal range of motion.     Right lower leg: No edema.     Left lower leg: No edema.  Skin:    General: Skin is warm and dry.     Capillary Refill: Capillary refill takes less than 2 seconds.     Coloration: Skin is pale.     Findings: No ecchymosis, erythema,  lesion or wound.  Neurological:     Mental Status: He is alert and oriented to person, place, and time.     GCS: GCS eye subscore is 4. GCS verbal subscore is 5. GCS motor subscore is 6.     Cranial Nerves: Cranial nerves 2-12 are intact.     Sensory: Sensation is intact.     Motor: Motor function is intact. No weakness or abnormal muscle tone.     Coordination: Coordination is intact.  Psychiatric:        Mood and Affect: Mood normal.        Speech: Speech normal.        Behavior: Behavior normal.     ED Results / Procedures / Treatments   Labs (all labs ordered are listed, but only abnormal results are displayed) Labs Reviewed  BASIC METABOLIC PANEL - Abnormal; Notable for the following components:      Result Value   Glucose, Bld 142 (*)    All other components within  normal limits  CBC - Abnormal; Notable for the following components:   WBC 15.0 (*)    All other components within normal limits  HEPATIC FUNCTION PANEL - Abnormal; Notable for the following components:   AST 124 (*)    ALT 269 (*)    Alkaline Phosphatase 185 (*)    Total Bilirubin 5.3 (*)    Bilirubin, Direct 3.4 (*)    Indirect Bilirubin 1.9 (*)    All other components within normal limits  LIPASE, BLOOD - Abnormal; Notable for the following components:   Lipase 4,376 (*)    All other components within normal limits  CBG MONITORING, ED - Abnormal; Notable for the following components:   Glucose-Capillary 101 (*)    All other components within normal limits  URINALYSIS, ROUTINE W REFLEX MICROSCOPIC    EKG None  Radiology CT ABDOMEN PELVIS W CONTRAST Result Date: 02/11/2024 CLINICAL DATA:  Postop abdominal pain, cholecystectomy yesterday. Elevated liver enzymes. Evaluate for bile leak and common duct stone. EXAM: CT ABDOMEN AND PELVIS WITH CONTRAST TECHNIQUE: Multidetector CT imaging of the abdomen and pelvis was performed using the standard protocol following bolus administration of intravenous contrast. RADIATION DOSE REDUCTION: This exam was performed according to the departmental dose-optimization program which includes automated exposure control, adjustment of the mA and/or kV according to patient size and/or use of iterative reconstruction technique. CONTRAST:  OMNIPAQUE IOHEXOL 300 MG/ML  SOLN COMPARISON:  Ultrasound 02/07/2024 and CT 01/04/2023 FINDINGS: Lower chest: Bibasilar atelectasis.  No acute abnormality. Hepatobiliary: Interval cholecystectomy. Fluid and gas collection in the gallbladder fossa compatible with Surgicel. Mild intrahepatic biliary dilation and dilation of the common bile duct measuring up to 1.1 cm. No radiopaque stone. No focal hepatic lesion. Pancreas: Peripancreatic inflammatory stranding and fluid. No evidence of pancreatic necrosis. No organized  fluid collection. No ductal dilation. Fluid tracts inferiorly in the anterior pararenal space. Spleen: Unremarkable. Adrenals/Urinary Tract: Unremarkable adrenal glands. No urinary calculi or hydronephrosis. Filling defects in the posterior bladder on delayed images. This may be due to clot within the bladder however bladder mass is not excluded. Stomach/Bowel: Normal caliber large and small bowel. Thickening of the duodenal wall likely reactive secondary to pancreatitis. Stomach is within normal limits. Normal appendix. Vascular/Lymphatic: No significant vascular findings are present. No enlarged abdominal or pelvic lymph nodes. Reproductive: Unremarkable. Other: Free intraperitoneal air likely postoperative after cholecystectomy. No abscess. Musculoskeletal: No acute fracture. Subcutaneous stranding and gas related to laparoscopy port placement. IMPRESSION: 1.  Acute pancreatitis. No organized fluid collection. 2. Interval cholecystectomy. Mild intrahepatic biliary dilation and dilation of the common bile duct measuring up to 1.1 cm. No radiopaque stone. 3. Filling defects in the posterior bladder on delayed images. This may be due to clot within the bladder however bladder mass is not excluded. Recommend urology consult. Electronically Signed   By: Minerva Fester M.D.   On: 02/11/2024 02:19    Procedures Procedures    Medications Ordered in ED Medications  sodium chloride 0.9 % bolus 1,000 mL (0 mLs Intravenous Stopped 02/11/24 0242)  ondansetron (ZOFRAN) injection 4 mg (4 mg Intravenous Given 02/11/24 0020)  metoCLOPramide (REGLAN) injection 10 mg (10 mg Intravenous Given 02/11/24 0020)  HYDROmorphone (DILAUDID) injection 0.5 mg (0.5 mg Intravenous Given 02/11/24 0020)  iohexol (OMNIPAQUE) 300 MG/ML solution 100 mL (100 mLs Intravenous Contrast Given 02/11/24 0142)    ED Course/ Medical Decision Making/ A&P                                 Medical Decision Making Amount and/or Complexity of Data  Reviewed Labs: ordered. Radiology: ordered.  Risk Prescription drug management.   Differential Diagnosis considered includes, but not limited to: Biliary leak; choledocholithiasis; bowel obstruction; esophagitis; gastritis; peptic ulcer disease; pancreatitis; cardiac.  Presents with abdominal pain status post cholecystectomy.  Patient was noted to have slightly elevated LFTs prior to discharge but was feeling well at time of discharge.  He has not developed nausea, vomiting, upper abdominal pain.  Patient appears slightly ill at arrival.  He is actively vomiting.  Patient's LFTs are still elevated around where they were previously, although total bili is slightly elevated.  No fever or significant leukocytosis.  Vitals are otherwise unremarkable.  Patient has a markedly elevated lipase consistent with acute pancreatitis.  CT shows pancreatic inflammation.  No obvious common bile duct stone.  Discussed with Dr. Lovell Sheehan.  Continue IV hydration, analgesia, he will see patient this morning and admit to the hospital.        Final Clinical Impression(s) / ED Diagnoses Final diagnoses:  Acute pancreatitis, unspecified complication status, unspecified pancreatitis type    Rx / DC Orders ED Discharge Orders     None         Gilda Crease, MD 02/11/24 [phone]

## 2024-02-11 NOTE — H&P (Signed)
Mitchell Quinn is an 33 y.o. male.   Chief Complaint: Upper abdominal pain and nausea HPI: Patient is a 33 year old white male status post robotic assisted laparoscopic cholecystectomy on 02/09/2024 who presented back to the emergency room with upper abdominal pain and nausea.  Liver enzyme tests are significantly elevated as they were postoperatively, but his lipase is now at greater than 4000.  He underwent an MRCP which reveals no choledocholithiasis.  No bile leakage is noted.  It is noted that he has had epigastric abdominal pain and nausea for over 1 year.  He was recently diagnosed with acute cholecystitis.  Past Medical History:  Diagnosis Date   Gastroesophageal reflux disease, unspecified whether esophagitis present    Hypertension    IBS (irritable bowel syndrome)    Pneumonia     Past Surgical History:  Procedure Laterality Date   38 HOUR PH STUDY N/A 02/04/2024   Procedure: 24 HOUR PH STUDY;  Surgeon: Napoleon Form, MD;  Location: [facility];  Service: Gastroenterology;  Laterality: N/A;   BIOPSY  01/01/2024   Procedure: BIOPSY;  Surgeon: Franky Macho, MD;  [facility];  Service: Endoscopy;;   ESOPHAGEAL DILATION N/A 01/01/2024   Procedure: ESOPHAGEAL DILATION;  Surgeon: Franky Macho, MD;  [facility];  Service: Endoscopy;  Laterality: N/A;   ESOPHAGEAL MANOMETRY N/A 02/04/2024   Procedure: ESOPHAGEAL MANOMETRY (EM);  Surgeon: Napoleon Form, MD;  Location: [facility];  Service: Gastroenterology;  Laterality: N/A;   ESOPHAGOGASTRODUODENOSCOPY (EGD) WITH PROPOFOL N/A 01/01/2024   Procedure: ESOPHAGOGASTRODUODENOSCOPY (EGD) WITH PROPOFOL;  Surgeon: Franky Macho, MD;  [facility];  Service: Endoscopy;  Laterality: N/A;   ORIF WRIST FRACTURE      Family History  Problem Relation Age of Onset   Healthy Mother    Achalasia Mother    Healthy Father    Other Cousin        lower intestines removed, not working  anymore   Colon cancer Neg Hx    Social History:  reports that he has quit smoking. His smoking use included cigarettes. He has never been exposed to tobacco smoke. He has quit using smokeless tobacco.  His smokeless tobacco use included snuff. He reports that he does not drink alcohol and does not use drugs.  Allergies: No Known Allergies  (Not in a hospital admission)   Results for orders placed or performed during the hospital encounter of 02/10/24 (from the past 48 hours)  Basic metabolic panel     Status: Abnormal   Collection Time: 02/11/24 12:01 AM  Result Value Ref Range   Sodium 136 135 - 145 mmol/L   Potassium 3.5 3.5 - 5.1 mmol/L   Chloride 103 98 - 111 mmol/L   CO2 24 22 - 32 mmol/L   Glucose, Bld 142 (H) 70 - 99 mg/dL    Comment: Glucose reference range applies only to samples taken after fasting for at least 8 hours.   BUN 10 6 - 20 mg/dL   Creatinine, Ser 4.69 0.61 - 1.24 mg/dL   Calcium 9.2 8.9 - 62.9 mg/dL   GFR, Estimated >52 >84 mL/min    Comment: (NOTE) Calculated using the CKD-EPI Creatinine Equation (2021)    Anion gap 9 5 - 15    Comment: Performed at [facility], [address], [place], [place] [address]  CBC     Status: Abnormal   Collection Time: 02/11/24 12:01 AM  Result Value Ref Range  WBC 15.0 (H) 4.0 - 10.5 K/uL   RBC 4.47 4.22 - 5.81 MIL/uL   Hemoglobin 14.2 13.0 - 17.0 g/dL   HCT 78.2 95.6 - 21.3 %   MCV 89.9 80.0 - 100.0 fL   MCH 31.8 26.0 - 34.0 pg   MCHC 35.3 30.0 - 36.0 g/dL   RDW 08.6 57.8 - 46.9 %   Platelets 301 150 - 400 K/uL   nRBC 0.0 0.0 - 0.2 %    Comment: Performed at [facility], [address], [place], [place] [address]  Hepatic function panel     Status: Abnormal   Collection Time: 02/11/24 12:01 AM  Result Value Ref Range   Total Protein 7.2 6.5 - 8.1 g/dL   Albumin 3.9 3.5 - 5.0 g/dL   AST 841 (H) 15 - 41 U/L   ALT 269 (H) 0 - 44 U/L   Alkaline Phosphatase 185 (H) 38 - 126 U/L   Total Bilirubin 5.3 (H) 0.0 - 1.2  mg/dL   Bilirubin, Direct 3.4 (H) 0.0 - 0.2 mg/dL   Indirect Bilirubin 1.9 (H) 0.3 - 0.9 mg/dL    Comment: Performed at [facility], [address], [place], [place] [address]  Lipase, blood     Status: Abnormal   Collection Time: 02/11/24 12:01 AM  Result Value Ref Range   Lipase 4,376 (H) 11 - 51 U/L    Comment: RESULTS CONFIRMED BY MANUAL DILUTION Performed at [facility], [address], [place], [place] [address]   CBG monitoring, ED     Status: Abnormal   Collection Time: 02/11/24  1:11 AM  Result Value Ref Range   Glucose-Capillary 101 (H) 70 - 99 mg/dL    Comment: Glucose reference range applies only to samples taken after fasting for at least 8 hours.  Urinalysis, Routine w reflex microscopic -Urine, Clean Catch     Status: Abnormal   Collection Time: 02/11/24  2:52 AM  Result Value Ref Range   Color, Urine AMBER (A) YELLOW    Comment: BIOCHEMICALS MAY BE AFFECTED BY COLOR   APPearance CLEAR CLEAR   Specific Gravity, Urine 1.013 1.005 - 1.030   pH 6.0 5.0 - 8.0   Glucose, UA NEGATIVE NEGATIVE mg/dL   Hgb urine dipstick NEGATIVE NEGATIVE   Bilirubin Urine NEGATIVE NEGATIVE   Ketones, ur NEGATIVE NEGATIVE mg/dL   Protein, ur NEGATIVE NEGATIVE mg/dL   Nitrite NEGATIVE NEGATIVE   Leukocytes,Ua NEGATIVE NEGATIVE    Comment: Performed at [facility], [address] [place], [place] [address]   MR ABDOMEN MRCP W WO CONTAST Result Date: 02/11/2024 CLINICAL DATA:  Pancreatitis, acute, severe. Postop abdominal pain. Cholecystectomy yesterday. Elevated liver enzymes. EXAM: MRI ABDOMEN WITHOUT AND WITH CONTRAST (INCLUDING MRCP) TECHNIQUE: Multiplanar multisequence MR imaging of the abdomen was performed both before and after the administration of intravenous contrast. Heavily T2-weighted images of the biliary and pancreatic ducts were obtained, and three-dimensional MRCP images were rendered by post processing. CONTRAST:  10mL GADAVIST GADOBUTROL 1 MMOL/ML IV SOLN COMPARISON:  CT scan  abdomen and pelvis from earlier the same day. FINDINGS: Lower chest: There are patchy atelectatic changes at the lung bases. Otherwise unremarkable MR appearance to the lung bases. No pleural effusion. No pericardial effusion. Normal heart size. Hepatobiliary: The liver is normal in size and configuration. No intrahepatic or extrahepatic bile duct dilatation. No choledocholithiasis. Patient is status post recent cholecystectomy. There is small amount of fluid and fat stranding in the cholecystectomy bed, which is likely related to  surgery. There is no excretion of contrast in the cholecystectomy bed on the delayed images. Pancreas: There is slightly heterogeneous appearance of the pancreas predominantly the head/uncinate process with surrounding moderate fat stranding and small amount of fluid in the bilateral paracolic gutters, concerning for acute pancreatitis. There is homogeneous opacification of the pancreas, thus excluding pancreatic necrosis. The fluid collections are non organized and lack walls. Main pancreatic duct is not dilated. No suspicious pancreatic mass seen. Spleen:  Within normal limits in size and appearance. No focal mass. Adrenals/Urinary Tract: Unremarkable adrenal glands. No hydroureteronephrosis. No suspicious renal mass. Stomach/Bowel: Visualized portions within the abdomen are unremarkable. No disproportionate dilation of bowel loops. Vascular/Lymphatic: No pathologically enlarged lymph nodes identified. No abdominal aortic aneurysm demonstrated. Small amount of ascites in bilateral paracolic gutters and in the cholecystectomy bed region, as discussed above. Other: Focal fat stranding noted in the anterior abdominal wall related to laparoscopic port sites (series 21, image 68.). Musculoskeletal: No suspicious bone lesions identified. IMPRESSION: 1. Findings compatible with acute uncomplicated interstitial pancreatitis, as described above. 2. There is small amount of fluid and fat stranding  in the cholecystectomy bed, within normal limits for recent cholecystectomy. No organized collection noted. 3. No intra or extrahepatic bile duct dilation. No choledocholithiasis. 4. Multiple other nonacute observations, as described above. Electronically Signed   By: Jules Schick M.D.   On: 02/11/2024 10:00   MR 3D Recon At Scanner Result Date: 02/11/2024 CLINICAL DATA:  Pancreatitis, acute, severe. Postop abdominal pain. Cholecystectomy yesterday. Elevated liver enzymes. EXAM: MRI ABDOMEN WITHOUT AND WITH CONTRAST (INCLUDING MRCP) TECHNIQUE: Multiplanar multisequence MR imaging of the abdomen was performed both before and after the administration of intravenous contrast. Heavily T2-weighted images of the biliary and pancreatic ducts were obtained, and three-dimensional MRCP images were rendered by post processing. CONTRAST:  10mL GADAVIST GADOBUTROL 1 MMOL/ML IV SOLN COMPARISON:  CT scan abdomen and pelvis from earlier the same day. FINDINGS: Lower chest: There are patchy atelectatic changes at the lung bases. Otherwise unremarkable MR appearance to the lung bases. No pleural effusion. No pericardial effusion. Normal heart size. Hepatobiliary: The liver is normal in size and configuration. No intrahepatic or extrahepatic bile duct dilatation. No choledocholithiasis. Patient is status post recent cholecystectomy. There is small amount of fluid and fat stranding in the cholecystectomy bed, which is likely related to surgery. There is no excretion of contrast in the cholecystectomy bed on the delayed images. Pancreas: There is slightly heterogeneous appearance of the pancreas predominantly the head/uncinate process with surrounding moderate fat stranding and small amount of fluid in the bilateral paracolic gutters, concerning for acute pancreatitis. There is homogeneous opacification of the pancreas, thus excluding pancreatic necrosis. The fluid collections are non organized and lack walls. Main pancreatic duct  is not dilated. No suspicious pancreatic mass seen. Spleen:  Within normal limits in size and appearance. No focal mass. Adrenals/Urinary Tract: Unremarkable adrenal glands. No hydroureteronephrosis. No suspicious renal mass. Stomach/Bowel: Visualized portions within the abdomen are unremarkable. No disproportionate dilation of bowel loops. Vascular/Lymphatic: No pathologically enlarged lymph nodes identified. No abdominal aortic aneurysm demonstrated. Small amount of ascites in bilateral paracolic gutters and in the cholecystectomy bed region, as discussed above. Other: Focal fat stranding noted in the anterior abdominal wall related to laparoscopic port sites (series 21, image 68.). Musculoskeletal: No suspicious bone lesions identified. IMPRESSION: 1. Findings compatible with acute uncomplicated interstitial pancreatitis, as described above. 2. There is small amount of fluid and fat stranding in the cholecystectomy bed, within normal  limits for recent cholecystectomy. No organized collection noted. 3. No intra or extrahepatic bile duct dilation. No choledocholithiasis. 4. Multiple other nonacute observations, as described above. Electronically Signed   By: Jules Schick M.D.   On: 02/11/2024 10:00   CT ABDOMEN PELVIS W CONTRAST Result Date: 02/11/2024 CLINICAL DATA:  Postop abdominal pain, cholecystectomy yesterday. Elevated liver enzymes. Evaluate for bile leak and common duct stone. EXAM: CT ABDOMEN AND PELVIS WITH CONTRAST TECHNIQUE: Multidetector CT imaging of the abdomen and pelvis was performed using the standard protocol following bolus administration of intravenous contrast. RADIATION DOSE REDUCTION: This exam was performed according to the departmental dose-optimization program which includes automated exposure control, adjustment of the mA and/or kV according to patient size and/or use of iterative reconstruction technique. CONTRAST:  OMNIPAQUE IOHEXOL 300 MG/ML  SOLN COMPARISON:  Ultrasound  02/07/2024 and CT 01/04/2023 FINDINGS: Lower chest: Bibasilar atelectasis.  No acute abnormality. Hepatobiliary: Interval cholecystectomy. Fluid and gas collection in the gallbladder fossa compatible with Surgicel. Mild intrahepatic biliary dilation and dilation of the common bile duct measuring up to 1.1 cm. No radiopaque stone. No focal hepatic lesion. Pancreas: Peripancreatic inflammatory stranding and fluid. No evidence of pancreatic necrosis. No organized fluid collection. No ductal dilation. Fluid tracts inferiorly in the anterior pararenal space. Spleen: Unremarkable. Adrenals/Urinary Tract: Unremarkable adrenal glands. No urinary calculi or hydronephrosis. Filling defects in the posterior bladder on delayed images. This may be due to clot within the bladder however bladder mass is not excluded. Stomach/Bowel: Normal caliber large and small bowel. Thickening of the duodenal wall likely reactive secondary to pancreatitis. Stomach is within normal limits. Normal appendix. Vascular/Lymphatic: No significant vascular findings are present. No enlarged abdominal or pelvic lymph nodes. Reproductive: Unremarkable. Other: Free intraperitoneal air likely postoperative after cholecystectomy. No abscess. Musculoskeletal: No acute fracture. Subcutaneous stranding and gas related to laparoscopy port placement. IMPRESSION: 1. Acute pancreatitis. No organized fluid collection. 2. Interval cholecystectomy. Mild intrahepatic biliary dilation and dilation of the common bile duct measuring up to 1.1 cm. No radiopaque stone. 3. Filling defects in the posterior bladder on delayed images. This may be due to clot within the bladder however bladder mass is not excluded. Recommend urology consult. Electronically Signed   By: Minerva Fester M.D.   On: 02/11/2024 02:19    Review of Systems  Constitutional:  Positive for fatigue.  HENT: Negative.    Eyes: Negative.   Respiratory: Negative.    Cardiovascular: Negative.    Gastrointestinal:  Positive for abdominal pain and nausea.  Endocrine: Negative.   Genitourinary: Negative.   Musculoskeletal: Negative.   Skin: Negative.   Allergic/Immunologic: Negative.   Neurological: Negative.   Hematological: Negative.   Psychiatric/Behavioral: Negative.      Blood pressure 122/80, pulse 79, temperature 97.9 F (36.6 C), temperature source Oral, resp. rate 16, SpO2 94%. Physical Exam Vitals reviewed.  Constitutional:      Appearance: Normal appearance. He is not ill-appearing.  HENT:     Head: Normocephalic and atraumatic.  Eyes:     General: Scleral icterus present.  Cardiovascular:     Rate and Rhythm: Normal rate and regular rhythm.     Heart sounds: Normal heart sounds. No murmur heard.    No friction rub. No gallop.  Pulmonary:     Effort: Pulmonary effort is normal. No respiratory distress.     Breath sounds: Normal breath sounds. No stridor. No wheezing, rhonchi or rales.  Abdominal:     General: There is no distension.  Palpations: Abdomen is soft. There is no mass.     Tenderness: There is abdominal tenderness. There is no guarding or rebound.     Hernia: No hernia is present.     Comments: Incisions healing well  Skin:    General: Skin is warm and dry.  Neurological:     Mental Status: He is alert and oriented to person, place, and time.      Assessment/Plan Impression: Acute pancreatitis.  I suspect the patient may have passed a stone.  No evidence of choledocholithiasis at the present time. Plan: Will admit to the hospitalist for supportive care.  May start a clear liquid diet.  He was started on IV Zosyn as he just had surgery, but will monitor his leukocytosis.  Patient and family aware of treatment plan.  Franky Macho, MD 02/11/2024, 12:07 PM

## 2024-02-11 NOTE — ED Notes (Signed)
Lab contacted regarding the delay in receiving urine sample when it was sent ~45 mins ago. Lab has received the sample and will process now.

## 2024-02-11 NOTE — TOC CM/SW Note (Signed)
Transition of Care [facility]) - Inpatient Brief Assessment   Patient Details  Name: Mitchell Quinn MRN: [id number] Date of Birth: 02-Nov-1991  Transition of Care [facility]) CM/SW Contact:    Isabella Bowens, LCSWA Phone Number: 02/11/2024, 11:30 AM   Clinical Narrative:  Transition of Care Department [facility]) has reviewed patient and no TOC needs have been identified at this time. We will continue to monitor patient advancement through interdisciplinary progression rounds. If new patient transition needs arise, please place a TOC consult.  Transition of Care Asessment: Insurance and Status: Insurance coverage has been reviewed Patient has primary care physician: Yes Home environment has been reviewed: Apartment with spouse Prior level of function:: Independent Prior/Current Home Services: No current home services Social Drivers of Health Review: SDOH reviewed no interventions necessary Readmission risk has been reviewed: Yes Transition of care needs: no transition of care needs at this time

## 2024-02-11 NOTE — ED Notes (Signed)
Pt given water at this time

## 2024-02-11 NOTE — ED Notes (Signed)
Pt attempted to eat dinner and now has 7/10 pain. MD notified to see about additional pain medication.

## 2024-02-12 LAB — COMPREHENSIVE METABOLIC PANEL
ALT: 157 U/L — ABNORMAL HIGH (ref 0–44)
AST: 43 U/L — ABNORMAL HIGH (ref 15–41)
Albumin: 3.1 g/dL — ABNORMAL LOW (ref 3.5–5.0)
Alkaline Phosphatase: 166 U/L — ABNORMAL HIGH (ref 38–126)
Anion gap: 8 (ref 5–15)
BUN: 11 mg/dL (ref 6–20)
CO2: 27 mmol/L (ref 22–32)
Calcium: 8.5 mg/dL — ABNORMAL LOW (ref 8.9–10.3)
Chloride: 101 mmol/L (ref 98–111)
Creatinine, Ser: 0.79 mg/dL (ref 0.61–1.24)
GFR, Estimated: >60 mL/min (ref >60)
Glucose, Bld: 99 mg/dL (ref 70–99)
Potassium: 3.1 mmol/L — ABNORMAL LOW (ref 3.5–5.1)
Sodium: 136 mmol/L (ref 135–145)
Total Bilirubin: 2.1 mg/dL — ABNORMAL HIGH (ref 0.0–1.2)
Total Protein: 5.9 g/dL — ABNORMAL LOW (ref 6.5–8.1)

## 2024-02-12 LAB — CBC
HCT: 35.7 % — ABNORMAL LOW (ref 39.0–52.0)
Hemoglobin: 12 g/dL — ABNORMAL LOW (ref 13.0–17.0)
MCH: 30.6 pg (ref 26.0–34.0)
MCHC: 33.6 g/dL (ref 30.0–36.0)
MCV: 91.1 fL (ref 80.0–100.0)
Platelets: 229 10*3/uL (ref 150–400)
RBC: 3.92 MIL/uL — ABNORMAL LOW (ref 4.22–5.81)
RDW: 12.4 % (ref 11.5–15.5)
WBC: 13.4 10*3/uL — ABNORMAL HIGH (ref 4.0–10.5)
nRBC: 0 % (ref 0.0–0.2)

## 2024-02-12 LAB — PHOSPHORUS: Phosphorus: 2.9 mg/dL (ref 2.5–4.6)

## 2024-02-12 LAB — LIPASE, BLOOD: Lipase: 289 U/L — ABNORMAL HIGH (ref 11–51)

## 2024-02-12 LAB — MAGNESIUM: Magnesium: 1.8 mg/dL (ref 1.7–2.4)

## 2024-02-12 MED ORDER — CYCLOBENZAPRINE HCL 10 MG PO TABS
5.0000 mg | ORAL_TABLET | Freq: Three times a day (TID) | ORAL | Status: DC | PRN
  Administered 2024-02-12: 5 mg via ORAL
  Filled 2024-02-12: qty 1

## 2024-02-12 MED ORDER — POTASSIUM CHLORIDE CRYS ER 20 MEQ PO TBCR
40.0000 meq | EXTENDED_RELEASE_TABLET | Freq: Once | ORAL | Status: AC
  Administered 2024-02-12: 40 meq via ORAL
  Filled 2024-02-12: qty 2

## 2024-02-12 MED ORDER — KCL IN DEXTROSE-NACL 40-5-0.45 MEQ/L-%-% IV SOLN: INTRAVENOUS | Status: AC

## 2024-02-12 NOTE — Plan of Care (Signed)
Problem: Education: Goal: Knowledge of General Education information will improve Description Including pain rating scale, medication(s)/side effects and non-pharmacologic comfort measures Outcome: Progressing   Problem: Health Behavior/Discharge Planning: Goal: Ability to manage health-related needs will improve Outcome: Progressing

## 2024-02-12 NOTE — Plan of Care (Signed)
  Problem: Health Behavior/Discharge Planning: Goal: Ability to manage health-related needs will improve Outcome: Progressing   Problem: Clinical Measurements: Goal: Ability to maintain clinical measurements within normal limits will improve Outcome: Progressing Goal: Will remain free from infection Outcome: Progressing   Problem: Elimination: Goal: Will not experience complications related to bowel motility Outcome: Progressing   Problem: Pain Managment: Goal: General experience of comfort will improve and/or be controlled Outcome: Progressing

## 2024-02-12 NOTE — TOC Initial Note (Signed)
Transition of Care [facility]) - Initial/Assessment Note    Patient Details  Name: Mitchell Quinn MRN: [id number] Date of Birth: [birth year]  Transition of Care [facility]) CM/SW Contact:    Beather Arbour Phone Number: 02/12/2024, 4:37 PM  Clinical Narrative:                 CSW spoke with patient and assessed him. Patient is at risk for readmission and was admitted for Acute gallstone pancreatitis. Patient shared that it is his children and spouse in the home , he is independent , and able to drive. Patient does not use any equipment and have not had any outside services such as HH. Patient stated that he still has not had a BM and was going share with MD, CSW advised pt to inform his nurse as well of any complications so it can me passed to the doctor. TOC will continue to follow.   Expected Discharge Plan: Home/Self Care Barriers to Discharge: Continued Medical Work up   Patient Goals and CMS Choice Patient states their goals for this hospitalization and ongoing recovery are:: return back home CMS Medicare.gov Compare Post Acute Care list provided to:: Patient Choice offered to / list presented to : Patient      Expected Discharge Plan and Services In-house Referral: Clinical Social Work     Living arrangements for the past 2 months: Apartment                 DME Arranged: N/A DME Agency: NA       HH Arranged: NA HH Agency: NA        Prior Living Arrangements/Services Living arrangements for the past 2 months: Apartment Lives with:: Minor Children, Spouse Patient language and need for interpreter reviewed:: Yes Do you feel safe going back to the place where you live?: Yes      Need for Family Participation in Patient Care: Yes (Comment) Care giver support system in place?: Yes (comment)   Criminal Activity/Legal Involvement Pertinent to Current Situation/Hospitalization: No - Comment as needed  Activities of Daily Living   ADL Screening (condition at time of  admission) Independently performs ADLs?: Yes (appropriate for developmental age) Is the patient deaf or have difficulty hearing?: No Does the patient have difficulty seeing, even when wearing glasses/contacts?: No Does the patient have difficulty concentrating, remembering, or making decisions?: No  Permission Sought/Granted      Share Information with NAME: Elizabeth     Permission granted to share info w Relationship: Patient     Emotional Assessment Appearance:: Appears stated age   Affect (typically observed): Appropriate Orientation: : Oriented to Self, Oriented to Place, Oriented to  Time, Oriented to Situation Alcohol / Substance Use: Not Applicable Psych Involvement: No (comment)  Admission diagnosis:  Acute gallstone pancreatitis [K85.10] Acute pancreatitis, unspecified complication status, unspecified pancreatitis type [K85.90] Patient Active Problem List   Diagnosis Date Noted   Acute gallstone pancreatitis 02/11/2024   Calculus of gallbladder with cholecystitis 02/09/2024   Acute calculous cholecystitis 02/09/2024   Acute cholecystitis 02/07/2024   Elevated bilirubin 02/07/2024   Gastritis and gastroduodenitis 01/01/2024   GERD (gastroesophageal reflux disease) 12/05/2023   Dysphagia 12/05/2023   Nausea with vomiting 12/05/2023   Acute respiratory disease due to COVID-19 virus 09/02/2019   Pneumonia due to COVID-19 virus 09/02/2019   Respiratory Hypoxic failure, acute -Covid Related 09/02/2019   IRRITABLE BOWEL SYNDROME 10/23/2010   FOLLICULITIS 10/23/2010   Abdominal pain 10/03/2010   DIARRHEA 09/20/2010  Sprain of ankle 03/26/2010   PCP:  Estanislado Pandy, MD Pharmacy:   [facility] [phone] - [place], [place] - [address] AT [facility] [address] [place] [address] Phone: [phone] Fax: [phone]     Social Drivers of Health (SDOH) Social History: SDOH Screenings   Food Insecurity: No Food Insecurity  (02/11/2024)  Housing: Low Risk  (02/11/2024)  Transportation Needs: No Transportation Needs (02/11/2024)  Utilities: Not At Risk (02/11/2024)  Tobacco Use: Medium Risk (02/10/2024)   SDOH Interventions:     Readmission Risk Interventions    02/12/2024    4:35 PM 02/11/2024   11:28 AM  Readmission Risk Prevention Plan  Transportation Screening Complete Complete  Home Care Screening  Complete  Medication Review (RN CM)  Complete  HRI or Home Care Consult Complete   Social Work Consult for Recovery Care Planning/Counseling Complete   Palliative Care Screening Not Applicable   Medication Review Ocean[employer]) Complete

## 2024-02-12 NOTE — Progress Notes (Signed)
On call contacted this shift d/t low K and infrequent muscle spasms  See new orders for 2 admins of K and PRN flexeril

## 2024-02-12 NOTE — Progress Notes (Signed)
Subjective: Still having epigastric pain, especially with oral fluid intake.  It comes in waves.  It is controlled with p.o. and IV pain medication.  Objective: Vital signs in last 24 hours: Temp:  [97.9 F (36.6 C)-100 F (37.8 C)] 98.5 F (36.9 C) (02/13 0414) Pulse Rate:  [65-86] 76 (02/13 0414) Resp:  [16-18] 18 (02/13 0414) BP: (98-126)/(63-83) 101/63 (02/13 0414) SpO2:  [93 %-98 %] 95 % (02/13 0414) Last BM Date : 02/08/24  Intake/Output from previous day: 02/12 0701 - 02/13 0700 In: 2999.3 [P.O.:240; I.V.:2699.6; IV Piggyback:59.7] Out: -  Intake/Output this shift: No intake/output data recorded.  General appearance: alert, cooperative, and no distress Resp: clear to auscultation bilaterally Cardio: regular rate and rhythm, S1, S2 normal, no murmur, click, rub or gallop GI: Soft with mild tenderness in the epigastric region.  Incisions healing well.  Lab Results:  Recent Labs    02/11/24 0001 02/12/24 0341  WBC 15.0* 13.4*  HGB 14.2 12.0*  HCT 40.2 35.7*  PLT 301 229   BMET Recent Labs    02/11/24 0001 02/12/24 0341  NA 136 136  K 3.5 3.1*  CL 103 101  CO2 24 27  GLUCOSE 142* 99  BUN 10 11  CREATININE 1.05 0.79  CALCIUM 9.2 8.5*   PT/INR No results for input(s): "LABPROT", "INR" in the last 72 hours.  Studies/Results: MR ABDOMEN MRCP W WO CONTAST Result Date: 02/11/2024 CLINICAL DATA:  Pancreatitis, acute, severe. Postop abdominal pain. Cholecystectomy yesterday. Elevated liver enzymes. EXAM: MRI ABDOMEN WITHOUT AND WITH CONTRAST (INCLUDING MRCP) TECHNIQUE: Multiplanar multisequence MR imaging of the abdomen was performed both before and after the administration of intravenous contrast. Heavily T2-weighted images of the biliary and pancreatic ducts were obtained, and three-dimensional MRCP images were rendered by post processing. CONTRAST:  10mL GADAVIST GADOBUTROL 1 MMOL/ML IV SOLN COMPARISON:  CT scan abdomen and pelvis from earlier the same day.  FINDINGS: Lower chest: There are patchy atelectatic changes at the lung bases. Otherwise unremarkable MR appearance to the lung bases. No pleural effusion. No pericardial effusion. Normal heart size. Hepatobiliary: The liver is normal in size and configuration. No intrahepatic or extrahepatic bile duct dilatation. No choledocholithiasis. Patient is status post recent cholecystectomy. There is small amount of fluid and fat stranding in the cholecystectomy bed, which is likely related to surgery. There is no excretion of contrast in the cholecystectomy bed on the delayed images. Pancreas: There is slightly heterogeneous appearance of the pancreas predominantly the head/uncinate process with surrounding moderate fat stranding and small amount of fluid in the bilateral paracolic gutters, concerning for acute pancreatitis. There is homogeneous opacification of the pancreas, thus excluding pancreatic necrosis. The fluid collections are non organized and lack walls. Main pancreatic duct is not dilated. No suspicious pancreatic mass seen. Spleen:  Within normal limits in size and appearance. No focal mass. Adrenals/Urinary Tract: Unremarkable adrenal glands. No hydroureteronephrosis. No suspicious renal mass. Stomach/Bowel: Visualized portions within the abdomen are unremarkable. No disproportionate dilation of bowel loops. Vascular/Lymphatic: No pathologically enlarged lymph nodes identified. No abdominal aortic aneurysm demonstrated. Small amount of ascites in bilateral paracolic gutters and in the cholecystectomy bed region, as discussed above. Other: Focal fat stranding noted in the anterior abdominal wall related to laparoscopic port sites (series 21, image 68.). Musculoskeletal: No suspicious bone lesions identified. IMPRESSION: 1. Findings compatible with acute uncomplicated interstitial pancreatitis, as described above. 2. There is small amount of fluid and fat stranding in the cholecystectomy bed, within normal  limits for recent  cholecystectomy. No organized collection noted. 3. No intra or extrahepatic bile duct dilation. No choledocholithiasis. 4. Multiple other nonacute observations, as described above. Electronically Signed   By: Jules Schick M.D.   On: 02/11/2024 10:00   MR 3D Recon At Scanner Result Date: 02/11/2024 CLINICAL DATA:  Pancreatitis, acute, severe. Postop abdominal pain. Cholecystectomy yesterday. Elevated liver enzymes. EXAM: MRI ABDOMEN WITHOUT AND WITH CONTRAST (INCLUDING MRCP) TECHNIQUE: Multiplanar multisequence MR imaging of the abdomen was performed both before and after the administration of intravenous contrast. Heavily T2-weighted images of the biliary and pancreatic ducts were obtained, and three-dimensional MRCP images were rendered by post processing. CONTRAST:  10mL GADAVIST GADOBUTROL 1 MMOL/ML IV SOLN COMPARISON:  CT scan abdomen and pelvis from earlier the same day. FINDINGS: Lower chest: There are patchy atelectatic changes at the lung bases. Otherwise unremarkable MR appearance to the lung bases. No pleural effusion. No pericardial effusion. Normal heart size. Hepatobiliary: The liver is normal in size and configuration. No intrahepatic or extrahepatic bile duct dilatation. No choledocholithiasis. Patient is status post recent cholecystectomy. There is small amount of fluid and fat stranding in the cholecystectomy bed, which is likely related to surgery. There is no excretion of contrast in the cholecystectomy bed on the delayed images. Pancreas: There is slightly heterogeneous appearance of the pancreas predominantly the head/uncinate process with surrounding moderate fat stranding and small amount of fluid in the bilateral paracolic gutters, concerning for acute pancreatitis. There is homogeneous opacification of the pancreas, thus excluding pancreatic necrosis. The fluid collections are non organized and lack walls. Main pancreatic duct is not dilated. No suspicious pancreatic  mass seen. Spleen:  Within normal limits in size and appearance. No focal mass. Adrenals/Urinary Tract: Unremarkable adrenal glands. No hydroureteronephrosis. No suspicious renal mass. Stomach/Bowel: Visualized portions within the abdomen are unremarkable. No disproportionate dilation of bowel loops. Vascular/Lymphatic: No pathologically enlarged lymph nodes identified. No abdominal aortic aneurysm demonstrated. Small amount of ascites in bilateral paracolic gutters and in the cholecystectomy bed region, as discussed above. Other: Focal fat stranding noted in the anterior abdominal wall related to laparoscopic port sites (series 21, image 68.). Musculoskeletal: No suspicious bone lesions identified. IMPRESSION: 1. Findings compatible with acute uncomplicated interstitial pancreatitis, as described above. 2. There is small amount of fluid and fat stranding in the cholecystectomy bed, within normal limits for recent cholecystectomy. No organized collection noted. 3. No intra or extrahepatic bile duct dilation. No choledocholithiasis. 4. Multiple other nonacute observations, as described above. Electronically Signed   By: Jules Schick M.D.   On: 02/11/2024 10:00   CT ABDOMEN PELVIS W CONTRAST Result Date: 02/11/2024 CLINICAL DATA:  Postop abdominal pain, cholecystectomy yesterday. Elevated liver enzymes. Evaluate for bile leak and common duct stone. EXAM: CT ABDOMEN AND PELVIS WITH CONTRAST TECHNIQUE: Multidetector CT imaging of the abdomen and pelvis was performed using the standard protocol following bolus administration of intravenous contrast. RADIATION DOSE REDUCTION: This exam was performed according to the departmental dose-optimization program which includes automated exposure control, adjustment of the mA and/or kV according to patient size and/or use of iterative reconstruction technique. CONTRAST:  OMNIPAQUE IOHEXOL 300 MG/ML  SOLN COMPARISON:  Ultrasound 02/07/2024 and CT 01/04/2023 FINDINGS:  Lower chest: Bibasilar atelectasis.  No acute abnormality. Hepatobiliary: Interval cholecystectomy. Fluid and gas collection in the gallbladder fossa compatible with Surgicel. Mild intrahepatic biliary dilation and dilation of the common bile duct measuring up to 1.1 cm. No radiopaque stone. No focal hepatic lesion. Pancreas: Peripancreatic inflammatory stranding and fluid. No  evidence of pancreatic necrosis. No organized fluid collection. No ductal dilation. Fluid tracts inferiorly in the anterior pararenal space. Spleen: Unremarkable. Adrenals/Urinary Tract: Unremarkable adrenal glands. No urinary calculi or hydronephrosis. Filling defects in the posterior bladder on delayed images. This may be due to clot within the bladder however bladder mass is not excluded. Stomach/Bowel: Normal caliber large and small bowel. Thickening of the duodenal wall likely reactive secondary to pancreatitis. Stomach is within normal limits. Normal appendix. Vascular/Lymphatic: No significant vascular findings are present. No enlarged abdominal or pelvic lymph nodes. Reproductive: Unremarkable. Other: Free intraperitoneal air likely postoperative after cholecystectomy. No abscess. Musculoskeletal: No acute fracture. Subcutaneous stranding and gas related to laparoscopy port placement. IMPRESSION: 1. Acute pancreatitis. No organized fluid collection. 2. Interval cholecystectomy. Mild intrahepatic biliary dilation and dilation of the common bile duct measuring up to 1.1 cm. No radiopaque stone. 3. Filling defects in the posterior bladder on delayed images. This may be due to clot within the bladder however bladder mass is not excluded. Recommend urology consult. Electronically Signed   By: Minerva Fester M.D.   On: 02/11/2024 02:19    Anti-infectives: Anti-infectives (From admission, onward)    Start     Dose/Rate Route Frequency Ordered Stop   02/11/24 1600  piperacillin-tazobactam (ZOSYN) IVPB 3.375 g        3.375 g 12.5  mL/hr over 240 Minutes Intravenous Every 8 hours 02/11/24 0749 02/18/24 1559   02/11/24 0830  piperacillin-tazobactam (ZOSYN) IVPB 3.375 g        3.375 g 100 mL/hr over 30 Minutes Intravenous  Once 02/11/24 0816 02/11/24 1610       Assessment/Plan: Impression: Postoperative gallstone pancreatitis.  Still with poor intake.  Liver enzyme tests and lipase all improving.  Still with leukocytosis and mild hyperbilirubinemia. Plan: Continue clear liquid diet.  Continue IV fluid hydration as this is being used to treat his pancreatitis.  Continue to monitor labs.  Will advance diet once patient can tolerate clear liquid diet.  LOS: 1 day    Mitchell Quinn 02/12/2024

## 2024-02-13 LAB — COMPREHENSIVE METABOLIC PANEL
ALT: 111 U/L — ABNORMAL HIGH (ref 0–44)
AST: 28 U/L (ref 15–41)
Albumin: 3.1 g/dL — ABNORMAL LOW (ref 3.5–5.0)
Alkaline Phosphatase: 138 U/L — ABNORMAL HIGH (ref 38–126)
Anion gap: 7 (ref 5–15)
BUN: 6 mg/dL (ref 6–20)
CO2: 27 mmol/L (ref 22–32)
Calcium: 8.5 mg/dL — ABNORMAL LOW (ref 8.9–10.3)
Chloride: 101 mmol/L (ref 98–111)
Creatinine, Ser: 0.87 mg/dL (ref 0.61–1.24)
GFR, Estimated: >60 mL/min (ref >60)
Glucose, Bld: 89 mg/dL (ref 70–99)
Potassium: 3.8 mmol/L (ref 3.5–5.1)
Sodium: 135 mmol/L (ref 135–145)
Total Bilirubin: 2 mg/dL — ABNORMAL HIGH (ref 0.0–1.2)
Total Protein: 6.1 g/dL — ABNORMAL LOW (ref 6.5–8.1)

## 2024-02-13 LAB — CBC
HCT: 34.3 % — ABNORMAL LOW (ref 39.0–52.0)
Hemoglobin: 11.5 g/dL — ABNORMAL LOW (ref 13.0–17.0)
MCH: 30.9 pg (ref 26.0–34.0)
MCHC: 33.5 g/dL (ref 30.0–36.0)
MCV: 92.2 fL (ref 80.0–100.0)
Platelets: 233 10*3/uL (ref 150–400)
RBC: 3.72 MIL/uL — ABNORMAL LOW (ref 4.22–5.81)
RDW: 12.4 % (ref 11.5–15.5)
WBC: 13.6 10*3/uL — ABNORMAL HIGH (ref 4.0–10.5)
nRBC: 0 % (ref 0.0–0.2)

## 2024-02-13 LAB — LIPASE, BLOOD: Lipase: 74 U/L — ABNORMAL HIGH (ref 11–51)

## 2024-02-13 MED ORDER — MAGNESIUM HYDROXIDE 400 MG/5ML PO SUSP
30.0000 mL | Freq: Four times a day (QID) | ORAL | Status: DC | PRN
  Administered 2024-02-13 – 2024-02-14 (×2): 30 mL via ORAL
  Filled 2024-02-13 (×2): qty 30

## 2024-02-13 NOTE — Progress Notes (Signed)
  Subjective: Patient having less epigastric pain.  Tolerating clears.  Passing flatus, no BM.  Objective: Vital signs in last 24 hours: Temp:  [97.7 F (36.5 C)-99.8 F (37.7 C)] 99.6 F (37.6 C) (02/14 0752) Pulse Rate:  [84-85] 84 (02/14 0752) Resp:  [17-20] 18 (02/14 0752) BP: (100-105)/(59-71) 105/59 (02/14 0752) SpO2:  [92 %-97 %] 93 % (02/14 0752) Weight:  [108.9 kg] 108.9 kg (02/13 1421) Last BM Date : 02/08/24  Intake/Output from previous day: 02/13 0701 - 02/14 0700 In: 2552 [P.O.:840; I.V.:1574.3; IV Piggyback:137.8] Out: -  Intake/Output this shift: Total I/O In: 400 [P.O.:400] Out: -   General appearance: alert, cooperative, and no distress Resp: clear to auscultation bilaterally Cardio: regular rate and rhythm, S1, S2 normal, no murmur, click, rub or gallop GI: soft, non-tender; bowel sounds normal; no masses,  no organomegaly  Lab Results:  Recent Labs    02/12/24 0341 02/13/24 0425  WBC 13.4* 13.6*  HGB 12.0* 11.5*  HCT 35.7* 34.3*  PLT 229 233   BMET Recent Labs    02/12/24 0341 02/13/24 0425  NA 136 135  K 3.1* 3.8  CL 101 101  CO2 27 27  GLUCOSE 99 89  BUN 11 6  CREATININE 0.79 0.87  CALCIUM 8.5* 8.5*   PT/INR No results for input(s): "LABPROT", "INR" in the last 72 hours.  Studies/Results: No results found.  Anti-infectives: Anti-infectives (From admission, onward)    Start     Dose/Rate Route Frequency Ordered Stop   02/11/24 1600  piperacillin-tazobactam (ZOSYN) IVPB 3.375 g        3.375 g 12.5 mL/hr over 240 Minutes Intravenous Every 8 hours 02/11/24 0749 02/18/24 1559   02/11/24 0830  piperacillin-tazobactam (ZOSYN) IVPB 3.375 g        3.375 g 100 mL/hr over 30 Minutes Intravenous  Once 02/11/24 0816 02/11/24 0852       Assessment/Plan: Imp:  Resolving gallstone pancreatitis. Constipation. Plan:  Advance to heart healthy diet.  MOM for constipation.  Check labs in am.  LOS: 2 days    Franky Macho 02/13/2024

## 2024-02-14 LAB — CBC
HCT: 36.1 % — ABNORMAL LOW (ref 39.0–52.0)
Hemoglobin: 12 g/dL — ABNORMAL LOW (ref 13.0–17.0)
MCH: 30.5 pg (ref 26.0–34.0)
MCHC: 33.2 g/dL (ref 30.0–36.0)
MCV: 91.9 fL (ref 80.0–100.0)
Platelets: 295 10*3/uL (ref 150–400)
RBC: 3.93 MIL/uL — ABNORMAL LOW (ref 4.22–5.81)
RDW: 11.9 % (ref 11.5–15.5)
WBC: 13.7 10*3/uL — ABNORMAL HIGH (ref 4.0–10.5)
nRBC: 0.1 % (ref 0.0–0.2)

## 2024-02-14 LAB — COMPREHENSIVE METABOLIC PANEL
ALT: 81 U/L — ABNORMAL HIGH (ref 0–44)
AST: 17 U/L (ref 15–41)
Albumin: 3.1 g/dL — ABNORMAL LOW (ref 3.5–5.0)
Alkaline Phosphatase: 126 U/L (ref 38–126)
Anion gap: 8 (ref 5–15)
BUN: 8 mg/dL (ref 6–20)
CO2: 28 mmol/L (ref 22–32)
Calcium: 8.7 mg/dL — ABNORMAL LOW (ref 8.9–10.3)
Chloride: 98 mmol/L (ref 98–111)
Creatinine, Ser: 0.89 mg/dL (ref 0.61–1.24)
GFR, Estimated: >60 mL/min (ref >60)
Glucose, Bld: 104 mg/dL — ABNORMAL HIGH (ref 70–99)
Potassium: 3.8 mmol/L (ref 3.5–5.1)
Sodium: 134 mmol/L — ABNORMAL LOW (ref 135–145)
Total Bilirubin: 1.4 mg/dL — ABNORMAL HIGH (ref 0.0–1.2)
Total Protein: 6.8 g/dL (ref 6.5–8.1)

## 2024-02-14 LAB — LIPASE, BLOOD: Lipase: 63 U/L — ABNORMAL HIGH (ref 11–51)

## 2024-02-14 NOTE — Discharge Summary (Signed)
 Physician Discharge Summary  Patient ID: Mitchell Quinn MRN: [id number] DOB/AGE: Aug 08, 1991 33 y.o.  Admit date: 02/10/2024 Discharge date: 02/14/2024  Admission Diagnoses: Gallstone pancreatitis  Discharge Diagnoses:  Principal Problem:   Acute gallstone pancreatitis Status post robotic assisted laparoscopic cholecystectomy  Discharged Condition: good  Hospital Course: Patient is a 33 year old white male status post robotic assisted laparoscopic cholecystectomy on 02/09/2024 who presented back to the emergency room on 02/11/2024 with worsening epigastric pain.  He was found to have gallstone pancreatitis.  An MRCP was performed which revealed no choledocholithiasis.  It is presumed he passed a gallstone.  He was admitted to the hospital for supportive care of his pancreatitis.  His liver enzyme tests as well as lipase have all trended to normal.  His diet was advanced without difficulty once his bowel function returned and his pain was better controlled.  He is being discharged home on 02/14/2024 in good and improving condition.  Discharge Exam: Blood pressure 114/80, pulse 87, temperature 98.2 F (36.8 C), temperature source Oral, resp. rate 16, height 5\' 11"  (1.803 m), weight 108.9 kg, SpO2 97%. General appearance: alert, cooperative, and no distress Resp: clear to auscultation bilaterally Cardio: regular rate and rhythm, S1, S2 normal, no murmur, click, rub or gallop GI: soft, non-tender; bowel sounds normal; no masses,  no organomegaly  Disposition: Discharge disposition: 01-Home or Self Care       Discharge Instructions     Diet - low sodium heart healthy   Complete by: As directed    Increase activity slowly   Complete by: As directed       Allergies as of 02/14/2024   No Known Allergies      Medication List     STOP taking these medications    oxyCODONE 5 MG immediate release tablet Commonly known as: Oxy IR/ROXICODONE       TAKE these medications     citalopram 10 MG tablet Commonly known as: CELEXA Take 1 tablet by mouth daily.   lidocaine 2 % solution Commonly known as: XYLOCAINE Use as directed 15 mLs in the mouth or throat every 6 (six) hours as needed (chest pain/gerd).   ondansetron 4 MG disintegrating tablet Commonly known as: ZOFRAN-ODT Take 1 tablet (4 mg total) by mouth every 8 (eight) hours as needed for nausea or vomiting.   oxyCODONE-acetaminophen 5-325 MG tablet Commonly known as: PERCOCET/ROXICET Take 1 tablet by mouth every 4 (four) hours as needed for severe pain (pain score 7-10).         Signed: Franky Macho 02/14/2024, 10:04 AM

## 2024-02-17 ENCOUNTER — Ambulatory Visit (INDEPENDENT_AMBULATORY_CARE_PROVIDER_SITE_OTHER): Payer: 59 | Admitting: General Surgery

## 2024-02-17 ENCOUNTER — Encounter: Payer: Self-pay | Admitting: General Surgery

## 2024-02-17 VITALS — BP 115/74 | HR 68 | Temp 97.7°F | Resp 14 | Ht 71.0 in | Wt 237.0 lb

## 2024-02-17 DIAGNOSIS — K851 Biliary acute pancreatitis without necrosis or infection: Secondary | ICD-10-CM

## 2024-02-17 NOTE — Patient Instructions (Addendum)
 Get labwork next week at [facility]. Across from the Hospital. Go to [facility] [place] in [place], [place]. [address], [place], [place] [address]

## 2024-02-18 NOTE — Progress Notes (Signed)
 [facility]  Discussed the use of AI scribe software for clinical note transcription with the patient, who gave verbal consent to proceed.  History of Present Illness   Mitchell Quinn is a 33 year old male who presents with post-operative symptoms following gallbladder surgery.   He underwent gallbladder surgery and stayed overnight in the hospital. He was discharged the following morning but returned to the hospital two days post-surgery due to worsening pain and jaundice.  An MRI was performed to rule out the passage of a gallstone and it was negative for choledocholithiasis. His liver tests began to normalize before discharge, although they were not completely back to baseline.  Since discharge, he has been eating minimally due to hesitancy and ongoing discomfort. He describes experiencing significant gas and occasional stomach pain, which he attributes to movement and physical activity.  He is currently on medical leave and plans to return to work after a follow-up with his doctor on the fourteenth of the next month. He is also in the process of moving out of his apartment, which requires increased physical activity.      BP 115/74   Pulse 68   Temp 97.7 F (36.5 C) (Oral)   Resp 14   Ht 5\' 11"  (1.803 m)   Wt 237 lb (107.5 kg)   SpO2 96%   BMI 33.05 kg/m  Port sites healing, no erythema or drainage   Labs  trending down at discharge  Patient s/p robotic assisted cholecystectomy. Complicated by gallstone pancreatitis.   Assessment and Plan    Post-cholecystectomy Recent surgery with transient elevation in liver enzymes. Presented with worsening pain and jaundice, but MRI did not show any retained stones. Liver enzymes were decreasing at discharge but not yet normal. -Order liver function tests and lipase for early next week at [facility]. -Will call with results      PRN Follow up  Algis Greenhouse, MD [facility] [address] [place], [place] [address] [phone] (office)

## 2024-03-02 ENCOUNTER — Telehealth (INDEPENDENT_AMBULATORY_CARE_PROVIDER_SITE_OTHER): Payer: Self-pay | Admitting: Gastroenterology

## 2024-03-02 NOTE — Telephone Encounter (Signed)
 Marland Kitchen

## 2024-03-02 NOTE — Telephone Encounter (Signed)
 Received HRM and pH Impedance reports

## 2024-03-03 ENCOUNTER — Ambulatory Visit (INDEPENDENT_AMBULATORY_CARE_PROVIDER_SITE_OTHER): Admitting: Gastroenterology

## 2024-03-03 ENCOUNTER — Encounter (INDEPENDENT_AMBULATORY_CARE_PROVIDER_SITE_OTHER): Payer: Self-pay | Admitting: Gastroenterology

## 2024-03-03 VITALS — BP 122/83 | HR 80 | Temp 98.1°F | Ht 71.0 in | Wt 241.8 lb

## 2024-03-03 DIAGNOSIS — R748 Abnormal levels of other serum enzymes: Secondary | ICD-10-CM | POA: Insufficient documentation

## 2024-03-03 DIAGNOSIS — R7989 Other specified abnormal findings of blood chemistry: Secondary | ICD-10-CM | POA: Diagnosis not present

## 2024-03-03 DIAGNOSIS — K219 Gastro-esophageal reflux disease without esophagitis: Secondary | ICD-10-CM

## 2024-03-03 NOTE — Progress Notes (Signed)
 Mitchell Quinn , M.D. Gastroenterology & Hepatology [facility] Gastroenterology [address] [place], [place] [address] Primary Care Physician: Mitchell Pandy, MD [address] [place] [address]  Chief Complaint:  Resolved chest pain , GERD , Elevated liver enzymes  History of Present Illness: Mitchell Quinn is a 33 y.o. male with multiple recent hospitalization for chest pain who presents for evaluation of Resolved chest pain , GERD , Elevated liver enzymes  This is the first clinic visit of this patient with myself, I have seen patient inpatient when he was presented to ED and I had ordered few testing for Mitchell Quinn   Mitchell. Mitchell Quinn had at least 14 ER visits and few hospitalization for chest pain at [facility] and [facility] in past 1 year .  Patient had undergone extensive workup with EGD, manometry and 24-hour pH impedance test.  Patient mother has a history of achalasia and they were concerned about Achalasia given chest discomfort . Patient also had barium swallow which was unremarkable. Gastric emptying study showed slight gastroparesis, however likely unremarkable given that showed 86% emptying (90% being normal emptying). Esophageal manometry was essentially normal, no major dysmotility.  pH study showed increased nocturnal or supine reflux, overall reflux events were increased but no correlation with his symptoms.   Finally in Feb 2025 patient had an episode of acute cholecystitis status post cholecystectomy followed by gallstone pancreatitis.    Fortunately today all patient's symptoms have resolved and he is back to his normal life  He is here to be "cleared" to go back to work as he is asymptomatic and well now .  Past Medical History: Past Medical History:  Diagnosis Date   Gastroesophageal reflux disease, unspecified whether esophagitis present    Hypertension    IBS (irritable bowel syndrome)    Pneumonia     Past  Surgical History: Past Surgical History:  Procedure Laterality Date   52 HOUR PH STUDY N/A 02/04/2024   Procedure: 24 HOUR PH STUDY;  Surgeon: Mitchell Form, MD;  Location: [facility];  Service: Gastroenterology;  Laterality: N/A;   BIOPSY  01/01/2024   Procedure: BIOPSY;  Surgeon: Mitchell Macho, MD;  Location: [facility];  Service: Endoscopy;;   ESOPHAGEAL DILATION N/A 01/01/2024   Procedure: ESOPHAGEAL DILATION;  Surgeon: Mitchell Macho, MD;  Location: [facility];  Service: Endoscopy;  Laterality: N/A;   ESOPHAGEAL MANOMETRY N/A 02/04/2024   Procedure: ESOPHAGEAL MANOMETRY (EM);  Surgeon: Mitchell Form, MD;  Location: [facility];  Service: Gastroenterology;  Laterality: N/A;   ESOPHAGOGASTRODUODENOSCOPY (EGD) WITH PROPOFOL N/A 01/01/2024   Procedure: ESOPHAGOGASTRODUODENOSCOPY (EGD) WITH PROPOFOL;  Surgeon: Mitchell Macho, MD;  Location: [facility];  Service: Endoscopy;  Laterality: N/A;   ORIF WRIST FRACTURE      Family History: Family History  Problem Relation Age of Onset   Healthy Mother    Achalasia Mother    Healthy Father    Other Cousin        lower intestines removed, not working anymore   Colon cancer Neg Hx     Social History: Social History   Tobacco Use  Smoking Status Former   Types: Cigarettes   Passive exposure: Never  Smokeless Tobacco Former   Types: Snuff   Social History   Substance and Sexual Activity  Alcohol Use No   Social History   Substance and Sexual Activity  Drug Use Never  Allergies: No Known Allergies  Medications: No current outpatient medications on file.   No current facility-administered medications for this visit.    Review of Systems: GENERAL: negative for malaise, night sweats HEENT: No changes in hearing or vision, no nose bleeds or other nasal problems. NECK: Negative for lumps, goiter, pain and significant neck swelling RESPIRATORY: Negative for cough, wheezing CARDIOVASCULAR:  Negative for chest pain, leg swelling, palpitations, orthopnea GI: SEE HPI MUSCULOSKELETAL: Negative for joint pain or swelling, back pain, and muscle pain. SKIN: Negative for lesions, rash HEMATOLOGY Negative for prolonged bleeding, bruising easily, and swollen nodes. ENDOCRINE: Negative for cold or heat intolerance, polyuria, polydipsia and goiter. NEURO: negative for tremor, gait imbalance, syncope and seizures. The remainder of the review of systems is noncontributory.   Physical Exam: BP 122/83 (BP Location: Left Arm, Patient Position: Sitting, Cuff Size: Large)   Pulse 80   Temp 98.1 F (36.7 C) (Temporal)   Ht 5\' 11"  (1.803 m)   Wt 241 lb 12.8 oz (109.7 kg)   BMI 33.72 kg/m  GENERAL: The patient is AO x3, in no acute distress. HEENT: Head is normocephalic and atraumatic. EOMI are intact. Mouth is well hydrated and without lesions. NECK: Supple. No masses LUNGS: Clear to auscultation. No presence of rhonchi/wheezing/rales. Adequate chest expansion HEART: RRR, normal s1 and s2. ABDOMEN: Soft, nontender, no guarding, no peritoneal signs, and nondistended. BS +. No masses.   Imaging/Labs: as above     Latest Ref Rng & Units 02/14/2024    5:09 AM 02/13/2024    4:25 AM 02/12/2024    3:41 AM  CBC  WBC 4.0 - 10.5 K/uL 13.7  13.6  13.4   Hemoglobin 13.0 - 17.0 g/dL 16.1  09.6  04.5   Hematocrit 39.0 - 52.0 % 36.1  34.3  35.7   Platelets 150 - 400 K/uL 295  233  229    Lab Results  Component Value Date   FERRITIN 802 (H) 09/06/2019    I personally reviewed and interpreted the available labs, imaging and endoscopic files.  Impression and Plan:   Mitchell Quinn is a 33 y.o. male with multiple recent hospitalization for chest pain who presents for evaluation of Resolved chest pain , GERD , Elevated liver enzymes  #Chest pain -resolved #Elevated liver enzymes #GERD   This is the first clinic visit of this patient with myself, I have seen patient inpatient when he  was presented to ED and I had ordered few testing for Mitchell Quinn.  Patient had chest pain for past 1 year with multiple ER visits and hospitalization and at least 2 different systems [facility]. At [facility] patient was started on imipramine with suggestions of functional pain-Gut Brain interaction  .  Patient had extensive workup with EGD, gastric emptying study, achalasia and 24 pH impedance.  Patient mother had history of achalasia and they were concerned about patient having achalasia or esophageal motility disorder this time   Fortunately patient symptoms have resolved completely after cholecystectomy and likely all the symptoms were related to chronic biliary colic with referred pain to the chest which is very unusual  Patient has no complaints today.  We will repeat CBC and CMP to evaluate normalization of liver enzymes and hemoglobin  I spoke with Dr. Henreitta Leber today if patient has any working condition restriction given cholecystectomy in February as patient is requesting to be released to go back to work.  FMLA papers were signed today  Reviewed results of manometry  which was negative for achalasia and 24-hour pH impedance which showed nocturnal reflux.  Patient can take PPI as needed and famotidine at night  All questions were answered.      Mitchell Lawman, MD Gastroenterology and Hepatology [facility] Gastroenterology   This chart has been completed using [facility] Dictation software, and while attempts have been made to ensure accuracy , certain words and phrases may not be transcribed as intended

## 2024-03-03 NOTE — Patient Instructions (Signed)
 It was very nice to meet you today, as dicussed with will plan for the following :  1) labs

## 2024-03-12 ENCOUNTER — Encounter (INDEPENDENT_AMBULATORY_CARE_PROVIDER_SITE_OTHER): Payer: 59 | Admitting: Gastroenterology

## 2024-08-09 DIAGNOSIS — R0789 Other chest pain: Secondary | ICD-10-CM
